# Patient Record
Sex: Male | Born: 1940 | Race: Black or African American | Hispanic: No | Smoking: Former smoker
Health system: Southern US, Community
[De-identification: ages and names within clinical notes are randomized; demographics above are authoritative.]

## PROBLEM LIST (undated history)

## (undated) DIAGNOSIS — K5730 Diverticulosis of large intestine without perforation or abscess without bleeding: Secondary | ICD-10-CM

## (undated) DIAGNOSIS — N183 Chronic kidney disease, stage 3 unspecified: Secondary | ICD-10-CM

## (undated) DIAGNOSIS — M51369 Other intervertebral disc degeneration, lumbar region without mention of lumbar back pain or lower extremity pain: Secondary | ICD-10-CM

## (undated) DIAGNOSIS — D696 Thrombocytopenia, unspecified: Secondary | ICD-10-CM

## (undated) DIAGNOSIS — J449 Chronic obstructive pulmonary disease, unspecified: Secondary | ICD-10-CM

## (undated) DIAGNOSIS — K5790 Diverticulosis of intestine, part unspecified, without perforation or abscess without bleeding: Secondary | ICD-10-CM

## (undated) DIAGNOSIS — Z8546 Personal history of malignant neoplasm of prostate: Secondary | ICD-10-CM

## (undated) DIAGNOSIS — F32A Depression, unspecified: Secondary | ICD-10-CM

## (undated) DIAGNOSIS — N289 Disorder of kidney and ureter, unspecified: Secondary | ICD-10-CM

## (undated) DIAGNOSIS — F329 Major depressive disorder, single episode, unspecified: Secondary | ICD-10-CM

## (undated) DIAGNOSIS — J989 Respiratory disorder, unspecified: Secondary | ICD-10-CM

## (undated) DIAGNOSIS — K279 Peptic ulcer, site unspecified, unspecified as acute or chronic, without hemorrhage or perforation: Secondary | ICD-10-CM

## (undated) DIAGNOSIS — G9340 Encephalopathy, unspecified: Secondary | ICD-10-CM

## (undated) DIAGNOSIS — K222 Esophageal obstruction: Secondary | ICD-10-CM

## (undated) DIAGNOSIS — I10 Essential (primary) hypertension: Secondary | ICD-10-CM

## (undated) DIAGNOSIS — Z862 Personal history of diseases of the blood and blood-forming organs and certain disorders involving the immune mechanism: Secondary | ICD-10-CM

## (undated) DIAGNOSIS — N529 Male erectile dysfunction, unspecified: Secondary | ICD-10-CM

## (undated) DIAGNOSIS — R569 Unspecified convulsions: Secondary | ICD-10-CM

## (undated) DIAGNOSIS — K3184 Gastroparesis: Secondary | ICD-10-CM

## (undated) DIAGNOSIS — G4730 Sleep apnea, unspecified: Secondary | ICD-10-CM

## (undated) DIAGNOSIS — K219 Gastro-esophageal reflux disease without esophagitis: Secondary | ICD-10-CM

## (undated) DIAGNOSIS — E785 Hyperlipidemia, unspecified: Secondary | ICD-10-CM

## (undated) DIAGNOSIS — Z860100 Personal history of colon polyps, unspecified: Secondary | ICD-10-CM

## (undated) DIAGNOSIS — K859 Acute pancreatitis without necrosis or infection, unspecified: Secondary | ICD-10-CM

## (undated) DIAGNOSIS — E669 Obesity, unspecified: Secondary | ICD-10-CM

## (undated) DIAGNOSIS — Z8719 Personal history of other diseases of the digestive system: Secondary | ICD-10-CM

## (undated) DIAGNOSIS — G4733 Obstructive sleep apnea (adult) (pediatric): Secondary | ICD-10-CM

## (undated) DIAGNOSIS — I509 Heart failure, unspecified: Secondary | ICD-10-CM

## (undated) DIAGNOSIS — M5136 Other intervertebral disc degeneration, lumbar region: Secondary | ICD-10-CM

## (undated) DIAGNOSIS — E662 Morbid (severe) obesity with alveolar hypoventilation: Secondary | ICD-10-CM

## (undated) DIAGNOSIS — M1990 Unspecified osteoarthritis, unspecified site: Secondary | ICD-10-CM

## (undated) DIAGNOSIS — G40909 Epilepsy, unspecified, not intractable, without status epilepticus: Secondary | ICD-10-CM

## (undated) HISTORY — PX: BONE MARROW BIOPSY: SHX199

## (undated) HISTORY — DX: Encephalopathy, unspecified: G93.40

## (undated) HISTORY — DX: Obstructive sleep apnea (adult) (pediatric): G47.33

## (undated) HISTORY — DX: Hyperlipidemia, unspecified: E78.5

## (undated) HISTORY — DX: Chronic obstructive pulmonary disease, unspecified: J44.9

## (undated) HISTORY — DX: Unspecified convulsions: R56.9

## (undated) HISTORY — DX: Acute pancreatitis without necrosis or infection, unspecified: K85.90

## (undated) HISTORY — DX: Esophageal obstruction: K22.2

## (undated) HISTORY — DX: Essential (primary) hypertension: I10

## (undated) HISTORY — DX: Morbid (severe) obesity with alveolar hypoventilation: E66.2

## (undated) HISTORY — DX: Gastro-esophageal reflux disease without esophagitis: K21.9

## (undated) HISTORY — DX: Diverticulosis of large intestine without perforation or abscess without bleeding: K57.30

## (undated) HISTORY — DX: Unspecified osteoarthritis, unspecified site: M19.90

## (undated) HISTORY — PX: CORONARY ARTERY BYPASS GRAFT: SHX141

## (undated) HISTORY — PX: PROSTATE SURGERY: SHX751

## (undated) HISTORY — DX: Major depressive disorder, single episode, unspecified: F32.9

## (undated) HISTORY — DX: Respiratory disorder, unspecified: J98.9

## (undated) HISTORY — DX: Epilepsy, unspecified, not intractable, without status epilepticus: G40.909

## (undated) HISTORY — DX: Depression, unspecified: F32.A

## (undated) HISTORY — DX: Male erectile dysfunction, unspecified: N52.9

## (undated) HISTORY — DX: Diverticulosis of intestine, part unspecified, without perforation or abscess without bleeding: K57.90

## (undated) HISTORY — DX: Personal history of malignant neoplasm of prostate: Z85.46

## (undated) HISTORY — DX: Obesity, unspecified: E66.9

## (undated) HISTORY — DX: Sleep apnea, unspecified: G47.30

## (undated) HISTORY — DX: Gastroparesis: K31.84

## (undated) HISTORY — DX: Thrombocytopenia, unspecified: D69.6

## (undated) HISTORY — PX: BACK SURGERY: SHX140

## (undated) HISTORY — DX: Heart failure, unspecified: I50.9

---

## 1998-05-23 ENCOUNTER — Encounter: Payer: Self-pay | Admitting: General Surgery

## 1998-05-23 ENCOUNTER — Ambulatory Visit (HOSPITAL_COMMUNITY): Admission: RE | Admit: 1998-05-23 | Discharge: 1998-05-23 | Payer: Self-pay | Admitting: General Surgery

## 1998-06-04 ENCOUNTER — Ambulatory Visit (HOSPITAL_COMMUNITY): Admission: RE | Admit: 1998-06-04 | Discharge: 1998-06-04 | Payer: Self-pay | Admitting: General Surgery

## 1999-09-30 ENCOUNTER — Other Ambulatory Visit: Admission: RE | Admit: 1999-09-30 | Discharge: 1999-09-30 | Payer: Self-pay | Admitting: Gastroenterology

## 1999-09-30 ENCOUNTER — Encounter (INDEPENDENT_AMBULATORY_CARE_PROVIDER_SITE_OTHER): Payer: Self-pay | Admitting: Specialist

## 1999-10-18 ENCOUNTER — Emergency Department (HOSPITAL_COMMUNITY): Admission: EM | Admit: 1999-10-18 | Discharge: 1999-10-18 | Payer: Self-pay | Admitting: Emergency Medicine

## 2001-05-25 ENCOUNTER — Encounter: Payer: Self-pay | Admitting: Gastroenterology

## 2001-05-25 ENCOUNTER — Ambulatory Visit (HOSPITAL_COMMUNITY): Admission: RE | Admit: 2001-05-25 | Discharge: 2001-05-25 | Payer: Self-pay | Admitting: Family Medicine

## 2001-06-26 ENCOUNTER — Encounter: Admission: RE | Admit: 2001-06-26 | Discharge: 2001-06-26 | Payer: Self-pay | Admitting: Family Medicine

## 2001-06-26 ENCOUNTER — Encounter: Payer: Self-pay | Admitting: Family Medicine

## 2001-08-14 ENCOUNTER — Encounter: Payer: Self-pay | Admitting: Family Medicine

## 2001-08-14 ENCOUNTER — Encounter: Admission: RE | Admit: 2001-08-14 | Discharge: 2001-08-14 | Payer: Self-pay | Admitting: Family Medicine

## 2002-01-30 ENCOUNTER — Encounter: Admission: RE | Admit: 2002-01-30 | Discharge: 2002-01-30 | Payer: Self-pay | Admitting: Orthopedic Surgery

## 2002-01-30 ENCOUNTER — Encounter: Payer: Self-pay | Admitting: Orthopedic Surgery

## 2003-03-26 ENCOUNTER — Encounter: Admission: RE | Admit: 2003-03-26 | Discharge: 2003-03-26 | Payer: Self-pay | Admitting: Orthopedic Surgery

## 2004-03-17 ENCOUNTER — Ambulatory Visit: Payer: Self-pay | Admitting: Internal Medicine

## 2004-05-13 ENCOUNTER — Ambulatory Visit: Payer: Self-pay | Admitting: Internal Medicine

## 2004-08-03 ENCOUNTER — Ambulatory Visit: Payer: Self-pay | Admitting: Critical Care Medicine

## 2004-11-20 ENCOUNTER — Encounter: Admission: RE | Admit: 2004-11-20 | Discharge: 2004-11-20 | Payer: Self-pay | Admitting: Orthopedic Surgery

## 2005-02-02 ENCOUNTER — Ambulatory Visit: Payer: Self-pay | Admitting: Internal Medicine

## 2005-03-09 ENCOUNTER — Ambulatory Visit: Payer: Self-pay | Admitting: Internal Medicine

## 2005-03-09 LAB — CONVERTED CEMR LAB: PSA: 0.52 ng/mL

## 2005-03-17 ENCOUNTER — Encounter: Admission: RE | Admit: 2005-03-17 | Discharge: 2005-03-17 | Payer: Self-pay | Admitting: Orthopedic Surgery

## 2005-04-01 ENCOUNTER — Ambulatory Visit: Payer: Self-pay | Admitting: Internal Medicine

## 2005-04-05 ENCOUNTER — Ambulatory Visit: Payer: Self-pay | Admitting: Internal Medicine

## 2005-04-21 ENCOUNTER — Ambulatory Visit: Payer: Self-pay | Admitting: Internal Medicine

## 2005-04-23 ENCOUNTER — Ambulatory Visit: Payer: Self-pay | Admitting: Internal Medicine

## 2005-06-28 ENCOUNTER — Ambulatory Visit: Payer: Self-pay | Admitting: Internal Medicine

## 2005-07-02 ENCOUNTER — Ambulatory Visit: Payer: Self-pay | Admitting: Internal Medicine

## 2005-07-03 ENCOUNTER — Emergency Department (HOSPITAL_COMMUNITY): Admission: EM | Admit: 2005-07-03 | Discharge: 2005-07-03 | Payer: Self-pay | Admitting: Emergency Medicine

## 2005-08-03 ENCOUNTER — Ambulatory Visit: Payer: Self-pay | Admitting: Internal Medicine

## 2005-08-12 ENCOUNTER — Ambulatory Visit: Payer: Self-pay | Admitting: Endocrinology

## 2005-09-06 ENCOUNTER — Ambulatory Visit: Payer: Self-pay

## 2005-09-08 ENCOUNTER — Ambulatory Visit: Payer: Self-pay | Admitting: Pulmonary Disease

## 2005-10-21 ENCOUNTER — Ambulatory Visit: Payer: Self-pay | Admitting: Internal Medicine

## 2005-10-21 ENCOUNTER — Inpatient Hospital Stay (HOSPITAL_COMMUNITY): Admission: RE | Admit: 2005-10-21 | Discharge: 2005-10-28 | Payer: Self-pay | Admitting: Orthopedic Surgery

## 2005-10-29 ENCOUNTER — Ambulatory Visit: Payer: Self-pay | Admitting: Internal Medicine

## 2005-11-13 ENCOUNTER — Emergency Department (HOSPITAL_COMMUNITY): Admission: EM | Admit: 2005-11-13 | Discharge: 2005-11-13 | Payer: Self-pay | Admitting: Emergency Medicine

## 2005-12-06 ENCOUNTER — Ambulatory Visit (HOSPITAL_BASED_OUTPATIENT_CLINIC_OR_DEPARTMENT_OTHER): Admission: RE | Admit: 2005-12-06 | Discharge: 2005-12-06 | Payer: Self-pay | Admitting: Internal Medicine

## 2005-12-16 ENCOUNTER — Ambulatory Visit: Payer: Self-pay | Admitting: Internal Medicine

## 2005-12-22 ENCOUNTER — Ambulatory Visit: Payer: Self-pay | Admitting: Pulmonary Disease

## 2006-02-03 ENCOUNTER — Ambulatory Visit: Payer: Self-pay | Admitting: Internal Medicine

## 2006-02-16 ENCOUNTER — Ambulatory Visit: Payer: Self-pay | Admitting: Internal Medicine

## 2006-02-16 LAB — CONVERTED CEMR LAB
Calcium: 9.2 mg/dL (ref 8.4–10.5)
Chloride: 102 meq/L (ref 96–112)
GFR calc non Af Amer: 40 mL/min
Glomerular Filtration Rate, Af Am: 49 mL/min/{1.73_m2}
Glucose, Bld: 116 mg/dL — ABNORMAL HIGH (ref 70–99)
Sodium: 138 meq/L (ref 135–145)

## 2006-04-05 ENCOUNTER — Ambulatory Visit: Payer: Self-pay | Admitting: Internal Medicine

## 2006-04-05 LAB — CONVERTED CEMR LAB
BUN: 25 mg/dL — ABNORMAL HIGH (ref 6–23)
Creatinine, Ser: 1.6 mg/dL — ABNORMAL HIGH (ref 0.4–1.5)
GFR calc non Af Amer: 46 mL/min
Potassium: 4.7 meq/L (ref 3.5–5.1)
Sodium: 141 meq/L (ref 135–145)

## 2006-07-11 ENCOUNTER — Ambulatory Visit: Payer: Self-pay | Admitting: Gastroenterology

## 2006-07-23 ENCOUNTER — Emergency Department (HOSPITAL_COMMUNITY): Admission: EM | Admit: 2006-07-23 | Discharge: 2006-07-23 | Payer: Self-pay | Admitting: Emergency Medicine

## 2006-08-31 ENCOUNTER — Ambulatory Visit: Payer: Self-pay | Admitting: Gastroenterology

## 2006-09-15 ENCOUNTER — Ambulatory Visit: Payer: Self-pay | Admitting: Gastroenterology

## 2006-09-15 ENCOUNTER — Encounter: Payer: Self-pay | Admitting: Gastroenterology

## 2006-09-22 ENCOUNTER — Encounter: Payer: Self-pay | Admitting: Internal Medicine

## 2006-09-22 DIAGNOSIS — I10 Essential (primary) hypertension: Secondary | ICD-10-CM | POA: Insufficient documentation

## 2006-09-22 DIAGNOSIS — E1165 Type 2 diabetes mellitus with hyperglycemia: Secondary | ICD-10-CM

## 2006-09-22 DIAGNOSIS — J449 Chronic obstructive pulmonary disease, unspecified: Secondary | ICD-10-CM

## 2006-09-22 DIAGNOSIS — E785 Hyperlipidemia, unspecified: Secondary | ICD-10-CM | POA: Insufficient documentation

## 2006-09-22 DIAGNOSIS — F17200 Nicotine dependence, unspecified, uncomplicated: Secondary | ICD-10-CM | POA: Insufficient documentation

## 2006-09-22 DIAGNOSIS — E1129 Type 2 diabetes mellitus with other diabetic kidney complication: Secondary | ICD-10-CM

## 2006-09-22 DIAGNOSIS — J309 Allergic rhinitis, unspecified: Secondary | ICD-10-CM | POA: Insufficient documentation

## 2006-09-22 DIAGNOSIS — R569 Unspecified convulsions: Secondary | ICD-10-CM

## 2006-09-22 DIAGNOSIS — G4733 Obstructive sleep apnea (adult) (pediatric): Secondary | ICD-10-CM

## 2006-09-22 HISTORY — DX: Unspecified convulsions: R56.9

## 2006-09-22 HISTORY — DX: Obstructive sleep apnea (adult) (pediatric): G47.33

## 2006-10-05 ENCOUNTER — Ambulatory Visit: Payer: Self-pay | Admitting: Internal Medicine

## 2006-10-10 ENCOUNTER — Encounter: Admission: RE | Admit: 2006-10-10 | Discharge: 2006-10-10 | Payer: Self-pay | Admitting: Internal Medicine

## 2006-11-03 ENCOUNTER — Ambulatory Visit: Payer: Self-pay

## 2006-11-03 ENCOUNTER — Encounter: Payer: Self-pay | Admitting: Internal Medicine

## 2006-11-09 ENCOUNTER — Ambulatory Visit: Payer: Self-pay

## 2006-11-09 ENCOUNTER — Ambulatory Visit: Payer: Self-pay | Admitting: Internal Medicine

## 2006-11-09 LAB — CONVERTED CEMR LAB
Bilirubin Urine: NEGATIVE
Chloride: 104 meq/L (ref 96–112)
GFR calc Af Amer: 41 mL/min
GFR calc non Af Amer: 34 mL/min
Glucose, Bld: 142 mg/dL — ABNORMAL HIGH (ref 70–99)
Hemoglobin, Urine: NEGATIVE
Ketones, ur: NEGATIVE mg/dL
Leukocytes, UA: NEGATIVE
Nitrite: NEGATIVE
Potassium: 4 meq/L (ref 3.5–5.1)
Pro B Natriuretic peptide (BNP): 4 pg/mL (ref 0.0–100.0)
Sodium: 144 meq/L (ref 135–145)
Specific Gravity, Urine: 1.015 (ref 1.000–1.03)
Total Protein, Urine: NEGATIVE mg/dL
Urine Glucose: NEGATIVE mg/dL
Urobilinogen, UA: 0.2 (ref 0.0–1.0)
pH: 5.5 (ref 5.0–8.0)

## 2006-12-24 ENCOUNTER — Ambulatory Visit (HOSPITAL_COMMUNITY): Admission: RE | Admit: 2006-12-24 | Discharge: 2006-12-24 | Payer: Self-pay | Admitting: Internal Medicine

## 2006-12-24 ENCOUNTER — Telehealth: Payer: Self-pay | Admitting: Internal Medicine

## 2006-12-24 ENCOUNTER — Ambulatory Visit: Payer: Self-pay | Admitting: Internal Medicine

## 2006-12-27 ENCOUNTER — Emergency Department (HOSPITAL_COMMUNITY): Admission: EM | Admit: 2006-12-27 | Discharge: 2006-12-27 | Payer: Self-pay | Admitting: Emergency Medicine

## 2006-12-29 ENCOUNTER — Ambulatory Visit: Payer: Self-pay | Admitting: Internal Medicine

## 2007-01-05 ENCOUNTER — Emergency Department (HOSPITAL_COMMUNITY): Admission: EM | Admit: 2007-01-05 | Discharge: 2007-01-06 | Payer: Self-pay | Admitting: Emergency Medicine

## 2007-01-13 ENCOUNTER — Ambulatory Visit: Payer: Self-pay | Admitting: Gastroenterology

## 2007-03-27 ENCOUNTER — Telehealth (INDEPENDENT_AMBULATORY_CARE_PROVIDER_SITE_OTHER): Payer: Self-pay | Admitting: *Deleted

## 2007-03-28 ENCOUNTER — Ambulatory Visit: Payer: Self-pay | Admitting: Internal Medicine

## 2007-03-29 ENCOUNTER — Encounter: Payer: Self-pay | Admitting: Internal Medicine

## 2007-04-17 DIAGNOSIS — D696 Thrombocytopenia, unspecified: Secondary | ICD-10-CM

## 2007-04-17 DIAGNOSIS — F329 Major depressive disorder, single episode, unspecified: Secondary | ICD-10-CM | POA: Insufficient documentation

## 2007-04-17 HISTORY — DX: Thrombocytopenia, unspecified: D69.6

## 2007-05-04 ENCOUNTER — Ambulatory Visit: Payer: Self-pay | Admitting: Gastroenterology

## 2007-05-10 ENCOUNTER — Ambulatory Visit: Payer: Self-pay | Admitting: Gastroenterology

## 2007-05-10 DIAGNOSIS — R1013 Epigastric pain: Secondary | ICD-10-CM | POA: Insufficient documentation

## 2007-05-15 ENCOUNTER — Ambulatory Visit (HOSPITAL_COMMUNITY): Admission: RE | Admit: 2007-05-15 | Discharge: 2007-05-15 | Payer: Self-pay | Admitting: Gastroenterology

## 2007-05-15 ENCOUNTER — Encounter: Payer: Self-pay | Admitting: Gastroenterology

## 2007-05-18 ENCOUNTER — Ambulatory Visit: Payer: Self-pay | Admitting: Gastroenterology

## 2007-05-30 ENCOUNTER — Ambulatory Visit (HOSPITAL_COMMUNITY): Admission: RE | Admit: 2007-05-30 | Discharge: 2007-05-30 | Payer: Self-pay | Admitting: Gastroenterology

## 2007-06-12 ENCOUNTER — Ambulatory Visit: Payer: Self-pay | Admitting: Gastroenterology

## 2007-06-21 ENCOUNTER — Encounter: Payer: Self-pay | Admitting: Internal Medicine

## 2007-10-13 ENCOUNTER — Ambulatory Visit: Payer: Self-pay | Admitting: Internal Medicine

## 2007-10-17 ENCOUNTER — Telehealth (INDEPENDENT_AMBULATORY_CARE_PROVIDER_SITE_OTHER): Payer: Self-pay | Admitting: *Deleted

## 2007-11-08 ENCOUNTER — Encounter: Payer: Self-pay | Admitting: Internal Medicine

## 2007-12-13 ENCOUNTER — Emergency Department (HOSPITAL_COMMUNITY): Admission: EM | Admit: 2007-12-13 | Discharge: 2007-12-14 | Payer: Self-pay | Admitting: Emergency Medicine

## 2007-12-19 ENCOUNTER — Ambulatory Visit: Payer: Self-pay | Admitting: Family Medicine

## 2007-12-21 ENCOUNTER — Encounter: Admission: RE | Admit: 2007-12-21 | Discharge: 2007-12-21 | Payer: Self-pay | Admitting: Chiropractic Medicine

## 2008-01-22 ENCOUNTER — Ambulatory Visit: Payer: Self-pay | Admitting: Family Medicine

## 2008-02-20 ENCOUNTER — Ambulatory Visit: Payer: Self-pay | Admitting: Family Medicine

## 2008-04-04 ENCOUNTER — Encounter: Payer: Self-pay | Admitting: Internal Medicine

## 2008-05-02 HISTORY — PX: CARDIAC CATHETERIZATION: SHX172

## 2008-07-11 ENCOUNTER — Encounter: Admission: RE | Admit: 2008-07-11 | Discharge: 2008-07-11 | Payer: Self-pay

## 2008-08-12 ENCOUNTER — Encounter: Payer: Self-pay | Admitting: Internal Medicine

## 2008-08-15 ENCOUNTER — Telehealth (INDEPENDENT_AMBULATORY_CARE_PROVIDER_SITE_OTHER): Payer: Self-pay | Admitting: *Deleted

## 2008-08-15 ENCOUNTER — Encounter: Payer: Self-pay | Admitting: Internal Medicine

## 2008-08-16 ENCOUNTER — Encounter: Payer: Self-pay | Admitting: Internal Medicine

## 2008-09-09 ENCOUNTER — Ambulatory Visit: Payer: Self-pay | Admitting: Family Medicine

## 2008-09-13 ENCOUNTER — Ambulatory Visit: Payer: Self-pay | Admitting: Internal Medicine

## 2008-09-24 LAB — CBC WITH DIFFERENTIAL/PLATELET
BASO%: 1.1 % (ref 0.0–2.0)
EOS%: 0.9 % (ref 0.0–7.0)
LYMPH%: 29.8 % (ref 14.0–49.0)
MCHC: 32.9 g/dL (ref 32.0–36.0)
MCV: 94.3 fL (ref 79.3–98.0)
MONO%: 9.2 % (ref 0.0–14.0)
Platelets: 63 10*3/uL — ABNORMAL LOW (ref 140–400)
RBC: 6.05 10*6/uL — ABNORMAL HIGH (ref 4.20–5.82)

## 2008-09-24 LAB — LACTATE DEHYDROGENASE: LDH: 184 U/L (ref 94–250)

## 2008-09-24 LAB — COMPREHENSIVE METABOLIC PANEL
ALT: 30 U/L (ref 0–53)
AST: 21 U/L (ref 0–37)
Alkaline Phosphatase: 72 U/L (ref 39–117)
CO2: 27 mEq/L (ref 19–32)
Creatinine, Ser: 2.09 mg/dL — ABNORMAL HIGH (ref 0.40–1.50)
Total Bilirubin: 0.6 mg/dL (ref 0.3–1.2)

## 2008-09-27 ENCOUNTER — Encounter (INDEPENDENT_AMBULATORY_CARE_PROVIDER_SITE_OTHER): Payer: Self-pay | Admitting: *Deleted

## 2008-09-27 ENCOUNTER — Ambulatory Visit (HOSPITAL_COMMUNITY): Admission: RE | Admit: 2008-09-27 | Discharge: 2008-09-27 | Payer: Self-pay | Admitting: Internal Medicine

## 2008-10-01 LAB — FOLATE: Folate: >20 ng/mL

## 2008-10-01 LAB — CBC WITH DIFFERENTIAL/PLATELET
BASO%: 0.8 % (ref 0.0–2.0)
Basophils Absolute: 0.1 10*3/uL (ref 0.0–0.1)
EOS%: 1.4 % (ref 0.0–7.0)
HCT: 56.8 % — ABNORMAL HIGH (ref 38.4–49.9)
HGB: 18.5 g/dL — ABNORMAL HIGH (ref 13.0–17.1)
MCH: 30.8 pg (ref 27.2–33.4)
MCHC: 32.6 g/dL (ref 32.0–36.0)
MCV: 94.4 fL (ref 79.3–98.0)
MONO%: 9.2 % (ref 0.0–14.0)
NEUT%: 61.1 % (ref 39.0–75.0)
RDW: 14.1 % (ref 11.0–14.6)

## 2008-10-01 LAB — VITAMIN B12: Vitamin B-12: 484 pg/mL (ref 211–911)

## 2008-10-01 LAB — ERYTHROPOIETIN: Erythropoietin: 19.3 m[IU]/mL (ref 2.6–34.0)

## 2008-10-01 LAB — IRON AND TIBC: UIBC: 278 ug/dL

## 2008-10-01 LAB — JAK2 GENOTYPR: JAK2 GenotypR: NOT DETECTED

## 2008-10-08 ENCOUNTER — Encounter: Payer: Self-pay | Admitting: Internal Medicine

## 2008-11-05 ENCOUNTER — Ambulatory Visit: Payer: Self-pay | Admitting: Family Medicine

## 2008-12-13 ENCOUNTER — Encounter: Payer: Self-pay | Admitting: Internal Medicine

## 2008-12-30 ENCOUNTER — Ambulatory Visit: Payer: Self-pay | Admitting: Internal Medicine

## 2009-01-01 LAB — CBC WITH DIFFERENTIAL/PLATELET
BASO%: 0.6 % (ref 0.0–2.0)
Basophils Absolute: 0.1 10*3/uL (ref 0.0–0.1)
EOS%: 1.3 % (ref 0.0–7.0)
HGB: 17.1 g/dL (ref 13.0–17.1)
MCH: 31.1 pg (ref 27.2–33.4)
MCHC: 32.5 g/dL (ref 32.0–36.0)
MONO#: 0.8 10*3/uL (ref 0.1–0.9)
RDW: 13.9 % (ref 11.0–14.6)
WBC: 11 10*3/uL — ABNORMAL HIGH (ref 4.0–10.3)
lymph#: 3.1 10*3/uL (ref 0.9–3.3)

## 2009-01-03 ENCOUNTER — Ambulatory Visit: Payer: Self-pay | Admitting: Family Medicine

## 2009-01-15 ENCOUNTER — Encounter: Payer: Self-pay | Admitting: Internal Medicine

## 2009-03-03 ENCOUNTER — Ambulatory Visit: Payer: Self-pay | Admitting: Internal Medicine

## 2009-04-07 ENCOUNTER — Encounter: Payer: Self-pay | Admitting: Internal Medicine

## 2009-04-10 ENCOUNTER — Ambulatory Visit: Payer: Self-pay | Admitting: Internal Medicine

## 2009-04-14 LAB — CBC WITH DIFFERENTIAL/PLATELET
Basophils Absolute: 0.1 10*3/uL (ref 0.0–0.1)
Eosinophils Absolute: 0.1 10*3/uL (ref 0.0–0.5)
HCT: 59 % — ABNORMAL HIGH (ref 38.4–49.9)
LYMPH%: 29.4 % (ref 14.0–49.0)
MONO#: 0.8 10*3/uL (ref 0.1–0.9)
NEUT#: 5.9 10*3/uL (ref 1.5–6.5)
NEUT%: 59.9 % (ref 39.0–75.0)
Platelets: 53 10*3/uL — ABNORMAL LOW (ref 140–400)
WBC: 9.8 10*3/uL (ref 4.0–10.3)

## 2009-05-02 ENCOUNTER — Ambulatory Visit (HOSPITAL_COMMUNITY): Admission: RE | Admit: 2009-05-02 | Discharge: 2009-05-02 | Payer: Self-pay | Admitting: Internal Medicine

## 2009-05-13 ENCOUNTER — Ambulatory Visit: Payer: Self-pay | Admitting: Internal Medicine

## 2009-06-02 ENCOUNTER — Ambulatory Visit: Payer: Self-pay | Admitting: Family Medicine

## 2009-06-09 LAB — CBC WITH DIFFERENTIAL/PLATELET
Basophils Absolute: 0 10*3/uL (ref 0.0–0.1)
Eosinophils Absolute: 0.1 10*3/uL (ref 0.0–0.5)
HCT: 60.8 % — ABNORMAL HIGH (ref 38.4–49.9)
HGB: 19.6 g/dL — ABNORMAL HIGH (ref 13.0–17.1)
MCV: 98.1 fL — ABNORMAL HIGH (ref 79.3–98.0)
NEUT#: 5.4 10*3/uL (ref 1.5–6.5)
RDW: 14.9 % — ABNORMAL HIGH (ref 11.0–14.6)
lymph#: 2.9 10*3/uL (ref 0.9–3.3)

## 2009-06-20 ENCOUNTER — Inpatient Hospital Stay (HOSPITAL_COMMUNITY): Admission: EM | Admit: 2009-06-20 | Discharge: 2009-06-23 | Payer: Self-pay | Admitting: Emergency Medicine

## 2009-06-20 ENCOUNTER — Encounter (INDEPENDENT_AMBULATORY_CARE_PROVIDER_SITE_OTHER): Payer: Self-pay | Admitting: *Deleted

## 2009-06-22 ENCOUNTER — Encounter (INDEPENDENT_AMBULATORY_CARE_PROVIDER_SITE_OTHER): Payer: Self-pay | Admitting: *Deleted

## 2009-06-23 ENCOUNTER — Encounter (INDEPENDENT_AMBULATORY_CARE_PROVIDER_SITE_OTHER): Payer: Self-pay | Admitting: *Deleted

## 2009-07-03 ENCOUNTER — Ambulatory Visit: Payer: Self-pay | Admitting: Family Medicine

## 2009-07-09 ENCOUNTER — Encounter (INDEPENDENT_AMBULATORY_CARE_PROVIDER_SITE_OTHER): Payer: Self-pay | Admitting: *Deleted

## 2009-07-09 ENCOUNTER — Emergency Department (HOSPITAL_COMMUNITY): Admission: EM | Admit: 2009-07-09 | Discharge: 2009-07-09 | Payer: Self-pay | Admitting: Emergency Medicine

## 2009-07-14 ENCOUNTER — Ambulatory Visit: Payer: Self-pay | Admitting: Family Medicine

## 2009-07-21 ENCOUNTER — Telehealth: Payer: Self-pay | Admitting: Gastroenterology

## 2009-07-24 ENCOUNTER — Telehealth: Payer: Self-pay | Admitting: Gastroenterology

## 2009-07-29 ENCOUNTER — Encounter (INDEPENDENT_AMBULATORY_CARE_PROVIDER_SITE_OTHER): Payer: Self-pay | Admitting: *Deleted

## 2009-08-05 ENCOUNTER — Ambulatory Visit: Payer: Self-pay | Admitting: Gastroenterology

## 2009-08-05 DIAGNOSIS — Z860100 Personal history of colon polyps, unspecified: Secondary | ICD-10-CM | POA: Insufficient documentation

## 2009-08-05 DIAGNOSIS — K2990 Gastroduodenitis, unspecified, without bleeding: Secondary | ICD-10-CM

## 2009-08-05 DIAGNOSIS — R1031 Right lower quadrant pain: Secondary | ICD-10-CM

## 2009-08-05 DIAGNOSIS — K2970 Gastritis, unspecified, without bleeding: Secondary | ICD-10-CM | POA: Insufficient documentation

## 2009-08-05 DIAGNOSIS — Z8546 Personal history of malignant neoplasm of prostate: Secondary | ICD-10-CM

## 2009-08-05 DIAGNOSIS — K859 Acute pancreatitis without necrosis or infection, unspecified: Secondary | ICD-10-CM | POA: Insufficient documentation

## 2009-08-05 DIAGNOSIS — K5730 Diverticulosis of large intestine without perforation or abscess without bleeding: Secondary | ICD-10-CM

## 2009-08-05 HISTORY — DX: Diverticulosis of large intestine without perforation or abscess without bleeding: K57.30

## 2009-08-05 HISTORY — DX: Personal history of malignant neoplasm of prostate: Z85.46

## 2009-08-06 ENCOUNTER — Ambulatory Visit: Payer: Self-pay | Admitting: Cardiology

## 2009-08-06 ENCOUNTER — Telehealth: Payer: Self-pay | Admitting: Gastroenterology

## 2009-08-06 LAB — CONVERTED CEMR LAB
Albumin: 4.3 g/dL (ref 3.5–5.2)
Alkaline Phosphatase: 80 units/L (ref 39–117)
Amylase: 117 units/L (ref 27–131)
Creatinine, Ser: 2.7 mg/dL — ABNORMAL HIGH (ref 0.4–1.5)
Total Protein: 7.6 g/dL (ref 6.0–8.3)

## 2009-08-07 ENCOUNTER — Encounter (INDEPENDENT_AMBULATORY_CARE_PROVIDER_SITE_OTHER): Payer: Self-pay | Admitting: *Deleted

## 2009-08-11 ENCOUNTER — Telehealth: Payer: Self-pay | Admitting: Gastroenterology

## 2009-08-15 ENCOUNTER — Ambulatory Visit: Payer: Self-pay | Admitting: Gastroenterology

## 2009-08-25 ENCOUNTER — Ambulatory Visit (HOSPITAL_COMMUNITY): Admission: RE | Admit: 2009-08-25 | Discharge: 2009-08-25 | Payer: Self-pay | Admitting: Gastroenterology

## 2009-08-26 ENCOUNTER — Telehealth: Payer: Self-pay | Admitting: Gastroenterology

## 2009-08-27 ENCOUNTER — Encounter (INDEPENDENT_AMBULATORY_CARE_PROVIDER_SITE_OTHER): Payer: Self-pay | Admitting: *Deleted

## 2009-09-04 ENCOUNTER — Encounter: Payer: Self-pay | Admitting: Internal Medicine

## 2009-10-01 ENCOUNTER — Encounter (INDEPENDENT_AMBULATORY_CARE_PROVIDER_SITE_OTHER): Payer: Self-pay | Admitting: *Deleted

## 2010-01-02 ENCOUNTER — Ambulatory Visit: Payer: Self-pay | Admitting: Family Medicine

## 2010-02-03 ENCOUNTER — Ambulatory Visit: Payer: Self-pay | Admitting: Family Medicine

## 2010-03-22 ENCOUNTER — Encounter: Payer: Self-pay | Admitting: Orthopedic Surgery

## 2010-03-23 ENCOUNTER — Telehealth: Payer: Self-pay | Admitting: Internal Medicine

## 2010-03-31 NOTE — Progress Notes (Signed)
Summary: CT Results   Phone Note Call from Patient Call back at Home Phone [phone]   Caller: Patient Call For: Dr. Arlyce Dice Reason for Call: Lab or Test Results Summary of Call: Calling about CT  results Initial call taken by: Karna Christmas,  August 11, 2009 10:47 AM  Follow-up for Phone Call        Pt. continues with abd.pain. He will see Dr.Kaplan on 08-15-09 at 11am. Pt. instructed to call back as needed.  Follow-up by: Laureen Ochs LPN,  August 11, 2009 11:02 AM

## 2010-03-31 NOTE — Progress Notes (Signed)
Summary: Needs Sooner Appt.   Phone Note From Other Clinic   Caller: Tobi Bastos @ Dr Leane Platt office [phone] Call For: Dr Arlyce Dice Summary of Call: Wonders if we already triaged him and he is ok to wait until appoinment on July 8th? He called Dr Leane Platt office asking them to call us for work in GI appt. Initial call taken by: Leanor Kail [facility],  Jul 24, 2009 4:08 PM  Follow-up for Phone Call        I spoke with the patient, he would like to be seen sooner than July, but not before June 7th, to f/u on pancreatitis, pt. states he feels fine, but would like to be seen sooner. Tobi Bastos w/Dr.LaLonde states pt. needs to be seen ASAP, after June 7th.  Pt. appt. has been moved to 08-25-09 at 2:30pm. I will wait for the extender schedule to get pt. on sooner, also I will monitor Dr.Kaplans schedule for a sooner spot for pt.  Follow-up by: Laureen Ochs LPN,  Jul 24, 2009 4:27 PM  Additional Follow-up for Phone Call Additional follow up Details #1::        Pt. will see Willette Cluster NP on 08-07-09 at 11am, pt. accepts this appt.  Tobi Bastos w/Dr.Lalonde will fax records. Additional Follow-up by: Laureen Ochs LPN,  Jul 29, 2009 9:09 AM

## 2010-03-31 NOTE — Procedures (Signed)
Summary: EGD   EGD  Procedure date:  05/15/2007  Findings:      Location:     EGD  Procedure date:  05/15/2007  Findings:      Location: [facility]   Patient Name: [name] [name], [name] [name] MRN: [id number] Procedure Procedures: Panendoscopy (EGD) CPT: 43235.    with biopsy(s)/brushing(s). CPT: D1846139.  Personnel: Endoscopist: Barbette Hair. Arlyce Dice, MD.  Indications Symptoms: Abdominal pain,  History  Current Medications: Patient, Patient is not currently taking Coumadin.  Comments: Patient history reviewed and updated, pre-procedure physical performed prior to initiation of sedation? Pre-Exam Physical: Performed May 15, 2007  Cardio-pulmonary exam, Cardio-pulmonary exam, HEENT exam, HEENT exam, Abdominal exam, Abdominal exam, Mental status exam WNL.  Comments: Patient history reviewed and updated, pre-procedure physical performed prior to initiation of sedation?yes Exam Exam Info: Maximum depth of insertion Duodenum, intended Duodenum. Vocal cords, Vocal cords visualized. Gastric retroflexion, Gastric retroflexion performed. ASA Classification: II. Tolerance: good.  Sedation Meds: Patient assessed and found to be appropriate for moderate (conscious) sedation. Robinul 0.2 given IV. Fentanyl 25 mcg. given IV. Versed 2 mg. given IV. Cetacaine Spray 2 sprays given aerosolized.  Monitoring: BP and pulse monitoring, BP and pulse monitoring done. Oximetry used., Oximetry used. Supplemental O2 given Supplemental O2 given at 2 Liters. at 2 Liters.  Findings - Normal: Proximal Esophagus to Fundus.  - MUCOSAL ABNORMALITY: Body to Antrum. Erythematous mucosa. Biopsy/Mucosal Abn taken. ICD9: Gastritis, Unspecified: 535.50. Comment: Streaky erythema.  - MUCOSAL ABNORMALITY: Duodenal Bulb. Subepithelial hemorrhage present. ICD9: Duodenitis without Hemorrhage: 535.60. Comment: Few abnormal areas in duodenal apex.  - Normal: Duodenal Apex to Duodenal 2nd  Portion.   Assessment Abnormal examination, see findings above.  Diagnoses: 535.60: Duodenitis without Hemorrhage.  535.50: Gastritis, Unspecified.   Events  Unplanned Intervention: No unplanned interventions were required.  Unplanned Events: , There were no complications. Plans Medication(s): H2Blocker: Pepcid/Famotidine 40 mg QD, starting May 15, 2007  Other: Levbid 0.375 BID, starting May 15, 2007   Disposition: After procedure patient sent to recovery. After recovery patient sent home.  Scheduling: Office Visit, to [employer]. Arlyce Dice, MD, around Jun 05, 2007.    CC: The Patient  This report was created from the original endoscopy report, which was reviewed and signed by the above listed endoscopist.

## 2010-03-31 NOTE — Letter (Signed)
Summary: Generic Letter  [facility]  [address] [place], [place] [address]   Phone: [phone]  Fax: [phone]    08/27/2009  MERRITT MCCRAVY [address] [place], [place]  [address]       Dear Mr. Lampley,      I have been unable to reach you by phone, so I am sending this letter. Dr.Kaplan would like you to see a surgeon to consult about your gallbladder. I have scheduled you to see Dr.Benjamin Hoxworth at [facility] on 09-11-09 at 11am, arrive at 10:40am to get registered. I have enclosed a phamplet from [facility] with their information on it. If you have any questions please call me at [phone]. Thank You.       Sincerely,   Laureen Ochs LPN  Appended Document: Generic Letter Letter mailed to patient.

## 2010-03-31 NOTE — Progress Notes (Signed)
Summary: TRIAGE   Phone Note Call from Patient Call back at Home Phone [phone]   Caller: Patient Call For: Dr. Arlyce Dice Reason for Call: Talk to Nurse Summary of Call: "little pill you put under your tongue" is not helping Initial call taken by: Vallarie Mare,  August 06, 2009 3:38 PM  Follow-up for Phone Call        Message left for patient to callback. Laureen Ochs LPN  August 07, 9526 3:45 PM   OV 08-05-09 and CT 08-06-09. Pt. continues with abd. pain, Dr.Lalonde won't refill his Oxycodone 5-325, pt. wants Dr.Kaplan to prescribe  it for him. Pt. states the Nulev doesn't help, "That Oxycontin helps me sleep and take my insulin."   DR.KAPLAN PLEASE ADVISE  Follow-up by: Laureen Ochs LPN,  August 07, 4130 4:30 PM  Additional Follow-up for Phone Call Additional follow up Details #1::        ok to refill oxycodone ov 1 month Additional Follow-up by: Louis Meckel MD,  August 07, 2009 2:14 PM    Additional Follow-up for Phone Call Additional follow up Details #2::    Message left for pt. with above MD instructions. Pt. aware he will need to come pick-up his script. His OV is scheduled for 09-22-09 at 1:30pm. Pt. instructed to call back as needed.  Follow-up by: Laureen Ochs LPN,  August 08, 4399 2:45 PM  Prescriptions: OXYCODONE-ACETAMINOPHEN 5-325 MG TABS (OXYCODONE-ACETAMINOPHEN) take one by mouth every 4 hours as needed  #60 x 0   Entered by:   Laureen Ochs LPN   Authorized by:   Louis Meckel MD   Signed by:   Laureen Ochs LPN on 02/72/5366   Method used:   Print then Give to Patient   RxID:   [id number]

## 2010-03-31 NOTE — Letter (Signed)
Summary: [facility]  [facility]   Imported By: Lanelle Bal 05/13/2009 10:45:09  _____________________________________________________________________  External Attachment:    Type:   Image     Comment:   External Document

## 2010-03-31 NOTE — Letter (Signed)
Summary: Appt Reminder 2  [facility]  [address] [place], [place] [address]   Phone: [phone]  Fax: [phone]        August 07, 2009 MRN: [id number]    [facility] [address] [place], [place]  [address]    Dear [name] [name],   You have a return appointment with Dr.Robert Arlyce Dice on 09-22-09 at 1:30pm. Please remember to bring a complete list of the medicines you are taking, your insurance card and your co-pay.  If you have to cancel or reschedule this appointment, please call before 5:00 pm the evening before to avoid a cancellation fee.  If you have any questions or concerns, please call [phone].    Sincerely,    Laureen Ochs LPN  Appended Document: Appt Reminder 2 Letter mailed to patient.

## 2010-03-31 NOTE — Assessment & Plan Note (Signed)
Summary: F/U CT AND PANCREATITIS            [name] [name]    History of Present Illness Visit Type: Follow-up Visit Primary GI MD: Melvia Heaps MD [facility] Primary Provider: Sharlot Gowda, MD Requesting Provider: na Chief Complaint: Patient complains of RUQ pain that is constant unless he takes his pain meds. He is here to follow up on his CT scan and pancreatitis. Patient complains that he has not had a regular BM in four day.  History of Present Illness:   [name] [name] has returned for followup of his abdominal pain.  Serum amylase and lipase are normal.  CT Scan, which I reviewed, demonstrated resolution of inflammatory changes around the pancreas.  No acute changes were seen.  Gallbladder appears normal.  [name] [name] continues to complain of right upper quadrant and periumbilicalpain.  He has been suffering from this since his hospitalization in April, 2011 for acute pancreatitis.   GI Review of Systems    Reports abdominal pain.     Location of  Abdominal pain: RUQ.    Denies acid reflux, belching, bloating, chest pain, dysphagia with liquids, dysphagia with solids, heartburn, loss of appetite, nausea, vomiting, vomiting blood, weight loss, and  weight gain.      Reports constipation.     Denies anal fissure, black tarry stools, change in bowel habit, diarrhea, diverticulosis, fecal incontinence, heme positive stool, hemorrhoids, irritable bowel syndrome, jaundice, light color stool, liver problems, rectal bleeding, and  rectal pain. Preventive Screening-Counseling & Management      Drug Use:  no.      Current Medications (verified): 1)  Simvastatin 40 Mg  Tabs (Simvastatin) .... One By Mouth Once Daily 2)  Glimepiride 2 Mg  Tabs (Glimepiride) .... Take 1/2  By Mouth Two Times A Day 3)  Advair Diskus 250-50 Mcg/dose  Misc (Fluticasone-Salmeterol) .... One Puff Two Times A Day 4)  Low-Dose Aspirin 81 Mg  Tabs (Aspirin) .... One By Mouth Once Daily 5)  Cpap 10 Cm H20 .... At Bedtime 6)  Oxygen  2 To 3 Liters Pulsed .... As Needed 7)  Nulev 0.125 Mg Tbdp (Hyoscyamine Sulfate) .... Put One Under Tongue Every 4 Hours As Needed For Abd.pain. 8)  Lisinopril 20 Mg Tabs (Lisinopril) .... Take One By Mouth Once Daily 9)  Zemplar 2 Mcg Caps (Paricalcitol) .... Take One By Mouth Every Other Day 10)  Furosemide 20 Mg Tabs (Furosemide) .... Take One By Mouth Two Times A Day 11)  Apidra Solostar 100 Unit/ml Soln (Insulin Glulisine) .... Inject 15 Subq Three Times A Day With Meals 12)  Lantus 100 Unit/ml Soln (Insulin Glargine) .... Inject 10 Units Subq Two Times A Day 13)  Protonix 40 Mg Tbec (Pantoprazole Sodium) .... Take One By Mouth Once Daily 14)  Oxycodone-Acetaminophen 5-325 Mg Tabs (Oxycodone-Acetaminophen) .... Take One By Mouth Every 6 Hours As Needed 15)  Gas-X 80 Mg Chew (Simethicone) .... Chew 2-3 Per Day  Allergies (verified): No Known Drug Allergies  Past History:  Past Medical History: Reviewed history from 08/05/2009 and no changes required. 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) CHRONIC RESPIRATORY FAILURE (ICD-518.83) MORBID OBESITY (ICD-278.01) ABDOMINAL PAIN, EPIGASTRIC (ICD-789.06) ERECTILE DYSFUNCTION (ICD-302.72) THROMBOCYTOPENIA (ICD-287.5) ARTHRITIS (ICD-716.90) DEPRESSION (ICD-311) COPD (ICD-496)   -PFTs 10/13/07 FEV1 of 45%, ratio 63%, FVC 15% improvement after bronchodilators DLCO 45% SEIZURE  DISORDER (ICD-780.39) HYPERTENSION (ICD-401.9) HYPERLIPIDEMIA (ICD-272.4) DIABETES MELLITUS, TYPE II (ICD-250.00) ALLERGIC RHINITIS (ICD-477.9) TOBACCO USER (ICD-305.1) SLEEP APNEA (ICD-780.57)  Past Surgical History: Reviewed history from 08/05/2009 and no changes  required. bone marrow bx lumbar laminectomy at L4-L5 microdiskectomy at L4-L5  CABG  Family History: Asthma father,  smoked cigas Family History of Kidney Disease: brother died with throat cancer No FH of Colon Cancer:  Social History: Patient is a current smoker but cutting down Alcohol Use - no Illicit Drug Use - no Drug Use:  no  Review of Systems       The patient complains of allergy/sinus, arthritis/joint pain, back pain, change in vision, fatigue, fever, shortness of breath, and sleeping problems.  The patient denies anemia, anxiety-new, blood in urine, breast changes/lumps, confusion, cough, coughing up blood, depression-new, fainting, headaches-new, hearing problems, heart murmur, heart rhythm changes, itching, menstrual pain, muscle pains/cramps, night sweats, nosebleeds, pregnancy symptoms, skin rash, sore throat, swelling of feet/legs, swollen lymph glands, thirst - excessive , urination - excessive , urination changes/pain, urine leakage, vision changes, and voice change.    Vital Signs:  Patient profile:   70 year old male Height:      68 inches Weight:      240.6 pounds BMI:     36.72 Pulse rate:   100 / minute Pulse rhythm:   regular BP sitting:   100 / 68  (left arm) Cuff size:   regular  Vitals Entered By: Harlow Mares CMA Duncan Dull) (August 15, 2009 11:21 AM)   Impression & Recommendations:  Problem # 1:  ABDOMINAL PAIN RIGHT LOWER QUADRANT (ICD-789.03) Persistent right upper quadrant) and periumbilical  pain could be due to pancreatic pain,  although not typical.  Pain from chronic cholecystitis is also a consideration.  Pain from an active ulcer including posterior penetrating ulcer is also a consideration, especially in view of his history pancreatitis without clear etiology.  Recommendations #1 HIDA scan; if negative I will proceed with upper endoscopy  Problem # 2:  PANCREATITIS, ACUTE (ICD-577.0) Assessment: Improved Etiology unclear.  This has  clinically resolved.  Recommendations #12 consider MRCP if he develops recurrent pancreatitis or has ongoing pain without clear etiology  Problem # 3:  CHRONIC RESPIRATORY FAILURE (ICD-518.83) Assessment: Comment Only  Patient Instructions: 1)  Copy sent to : Sharlot Gowda, MD 2)  Your HIDA scan is scheduled at [facility] radiology on 08/25/2009 at 1pm 3)  The medication list was reviewed and reconciled.  All changed / newly prescribed medications were explained.  A complete medication list was provided to the patient / caregiver.  Appended Document: Orders Update    Clinical Lists Changes  Orders: Added new Referral order of HIDA Scan (HIDA SCAN) - Signed

## 2010-03-31 NOTE — Progress Notes (Signed)
Summary: TRIAGE--Abd Pain  Medications Added NULEV 0.125 MG TBDP (HYOSCYAMINE SULFATE) Put one under tongue every 4 hours as needed for abd.pain.       Phone Note Call from Patient Call back at Home Phone [phone]   Call For: Dr Arlyce Dice Summary of Call: Scheduled next available on 09-05-09 but wonders if we can see him sooner even with the PA Initial call taken by: Leanor Kail [facility],  Jul 21, 2009 12:19 PM  Follow-up for Phone Call        Says Dr.Lalonde put him on pantoprazole q.d. and Oxycodone.Advised to cont. to take  pantoprazole and start Nulev which he was advised to take at OV last year but did not take. however warning came up to not use over 65 so  pt.told I will send to Dr.Demoni Parmar to advise when he returns to office on Wed.   Told  he needs to contact Dr.Lalonde re: Oxycodone as Dr Arlyce Dice did not order it.l Follow-up by: Teryl Lucy RN,  Jul 21, 2009 3:37 PM  Additional Follow-up for Phone Call Additional follow up Details #1::        ok for nulev 0.125mg  SL Q4 hours as needed. Additional Follow-up by: Louis Meckel MD,  Jul 22, 2009 12:02 PM    Additional Follow-up for Phone Call Additional follow up Details #2::    Above MD orders reviewed with patient. Med. to pharmacy. Pt. instructed to call back as needed.   Follow-up by: Laureen Ochs LPN,  Jul 22, 2009 12:48 PM  New/Updated Medications: NULEV 0.125 MG TBDP (HYOSCYAMINE SULFATE) Put one under tongue every 4 hours as needed for abd.pain. Prescriptions: NULEV 0.125 MG TBDP (HYOSCYAMINE SULFATE) Put one under tongue every 4 hours as needed for abd.pain.  #30 x 1   Entered by:   Laureen Ochs LPN   Authorized by:   Louis Meckel MD   Signed by:   Laureen Ochs LPN on 46/96/2952   Method used:   Electronically to        [facility]  [address] [phone]* (retail)       [address]       [place], [place]  [address]       Ph: [phone] or [phone]       Fax: [phone]   RxID:    [id number]

## 2010-03-31 NOTE — Progress Notes (Signed)
Summary: Surgical Consult Scheduled   Phone Note Outgoing Call Call back at Home Phone [phone]   Call placed by: Laureen Ochs LPN,  August 26, 2009 4:08 PM Call placed to: Patient Summary of Call: Follow-up from HIDA Scan done 08-25-09, pt. needs a surgical consult with Dr. Johna Sheriff, for possible Cholecystectomy. Appt. is scheduled for 09-11-09 at 11am, arrive at 10:40am, at [facility].  Pt's # is disconnected, I will mail him the appt. information and continue to try and reach him by phone, this week. Initial call taken by: Laureen Ochs LPN,  August 26, 2009 4:14 PM  Follow-up for Phone Call        Pt's # is still disconnected. Laureen Ochs LPN  August 27, 2009 2:42 PM  # still disconnected. I mailed him appt. information yesterday. I will check back on his # next week. Follow-up by: Laureen Ochs LPN,  August 28, 2009 11:43 AM     Appended Document: Surgical Consult Scheduled Pt. came by the office for an update, I reviewed his surgical appt. info. with him. His phone # has also been updated in the computer. Pt. instructed to call back as needed.

## 2010-03-31 NOTE — Letter (Signed)
Summary: Appt Reminder 2  [facility]  [address] [place], [place] [address]   Phone: [phone]  Fax: [phone]        Jul 29, 2009 MRN: [id number]    [facility] [address] [place], [place]  [address]    Dear [name] [name],   You have a return appointment with Dr.Robert Arlyce Dice on 08-07-09 at 11am.  Please remember to bring a complete list of the medicines you are taking, your insurance card and your co-pay.  If you have to cancel or reschedule this appointment, please call before 5:00 pm the evening before to avoid a cancellation fee.  If you have any questions or concerns, please call [phone].    Sincerely,    Laureen Ochs LPN  Appended Document: Appt Reminder 2 Letter mailed to patient.

## 2010-03-31 NOTE — Assessment & Plan Note (Signed)
Summary: F/U PANCREATITIS                        DEBORAH    History of Present Illness Visit Type: Initial Visit Primary GI MD: Melvia Heaps MD [facility] Primary Provider: Sharlot Gowda, MD Requesting Provider: Sharlot Gowda, MD Chief Complaint: RLQ sharp intermittant abd pains x 6 weeks. Pt is here for post hosp pancreatitis.  History of Present Illness:   [name] [name] has returned following hospitalization in April, 2011 for abdominal pain.  This is the same pain that he has had for over 2 years.  Endoscopy in 2009 demonstrated a mild gastroduodenitis.  A CT scan during his hospitalization showed some prominence of the fat planes around the pancreatic head suggesting mild pancreatitis.  Serum lipase x2 was normal.  Pain continues in the periumbilical, epigastric, right upper quadrant and right lower quadrant and radiates around to the back.  It is unrelated to eating or bowel movements. He takes  narcotics chronically for persistent pain.  He complains of constipation.  The patient has oxygen-dependent COPD.  He is on insulin dependent diabetic.   GI Review of Systems    Reports abdominal pain and  loss of appetite.     Location of  Abdominal pain: RLQ.    Denies acid reflux, belching, bloating, chest pain, dysphagia with liquids, dysphagia with solids, heartburn, vomiting, vomiting blood, weight loss, and  weight gain.      Reports constipation.     Denies anal fissure, black tarry stools, change in bowel habit, diarrhea, diverticulosis, fecal incontinence, heme positive stool, hemorrhoids, irritable bowel syndrome, jaundice, light color stool, liver problems, rectal bleeding, and  rectal pain.    Current Medications (verified): 1)  Simvastatin 40 Mg  Tabs (Simvastatin) .... One By Mouth Once Daily 2)  Glimepiride 2 Mg  Tabs (Glimepiride) .... Take 1/2  By Mouth Two Times A Day 3)  Advair Diskus 250-50 Mcg/dose  Misc (Fluticasone-Salmeterol) .... One Puff Two Times A Day 4)  Low-Dose Aspirin 81  Mg  Tabs (Aspirin) .... One By Mouth Once Daily 5)  Cpap 10 Cm H20 .... At Bedtime 6)  Oxygen 2 To 3 Liters Pulsed .... As Needed 7)  Nulev 0.125 Mg Tbdp (Hyoscyamine Sulfate) .... Put One Under Tongue Every 4 Hours As Needed For Abd.pain. 8)  Lisinopril 20 Mg Tabs (Lisinopril) .... Take One By Mouth Once Daily 9)  Zemplar 2 Mcg Caps (Paricalcitol) .... Take One By Mouth Every Other Day 10)  Furosemide 20 Mg Tabs (Furosemide) .... Take One By Mouth Two Times A Day 11)  Apidra Solostar 100 Unit/ml Soln (Insulin Glulisine) .... Inject 15 Subq Three Times A Day With Meals 12)  Lantus 100 Unit/ml Soln (Insulin Glargine) .... Inject 10 Units Subq Two Times A Day 13)  Protonix 40 Mg Tbec (Pantoprazole Sodium) .... Take One By Mouth Once Daily 14)  Oxycodone-Acetaminophen 5-325 Mg Tabs (Oxycodone-Acetaminophen) .... Take One By Mouth Every 4 Hours As Needed  Allergies (verified): No Known Drug Allergies  Past History:  Past Medical History: Reviewed history from 08/05/2009 and no changes required. 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) CHRONIC RESPIRATORY FAILURE (ICD-518.83) MORBID OBESITY (ICD-278.01) ABDOMINAL PAIN, EPIGASTRIC (ICD-789.06) ERECTILE DYSFUNCTION (ICD-302.72) THROMBOCYTOPENIA (ICD-287.5) ARTHRITIS (ICD-716.90) DEPRESSION (ICD-311) COPD (ICD-496)   -PFTs 10/13/07 FEV1 of 45%, ratio  63%, FVC 15% improvement after bronchodilators DLCO 45% SEIZURE DISORDER (ICD-780.39) HYPERTENSION (ICD-401.9) HYPERLIPIDEMIA (ICD-272.4) DIABETES MELLITUS, TYPE II (ICD-250.00) ALLERGIC RHINITIS (ICD-477.9) TOBACCO USER (ICD-305.1) SLEEP APNEA (ICD-780.57)  Past Surgical History: Reviewed history from  08/05/2009 and no changes required. bone marrow bx lumbar laminectomy at L4-L5 microdiskectomy at L4-L5  CABG  Family History: Reviewed history from 08/05/2009 and no changes required. Asthma father,  smoked cigas Family History of Kidney Disease: two brother died with cancer  Social History: Reviewed history from 08/05/2009 and no changes required. Patient is a current smoker but cutting down Alcohol Use - no  Review of Systems       The patient complains of fever, shortness of breath, and sleeping problems.  The patient denies allergy/sinus, anemia, anxiety-new, arthritis/joint pain, back pain, blood in urine, breast changes/lumps, change in vision, confusion, cough, coughing up blood, depression-new, fainting, fatigue, headaches-new, hearing problems, heart murmur, heart rhythm changes, itching, menstrual pain, muscle pains/cramps, night sweats, nosebleeds, pregnancy symptoms, skin rash, sore throat, swelling of feet/legs, swollen lymph glands, thirst - excessive , urination - excessive , urination changes/pain, urine leakage, vision changes, and voice change.         All other systems were reviewed and were negative   Vital Signs:  Patient profile:   70 year old male Height:      68 inches Weight:      240.13 pounds BMI:     36.64 Pulse rate:   90 / minute Pulse rhythm:   regular BP sitting:   106 / 68  (right arm) Cuff size:   large  Vitals Entered By: Christie Nottingham CMA Duncan Dull) (August 05, 2009 2:07 PM)  Physical Exam  Additional Exam:  On physical exam he is a chronically ill-appearing male  skin: anicteric HEENT: normocephalic; PEERLA; no nasal or pharyngeal abnormalities neck: supple nodes: no cervical lymphadenopathy chest: breath sounds are decreased throughout heart: no murmurs, gallops, or rubs abd: soft, nontender; BS normoactive; no abdominal masses, tenderness, organomegaly rectal: deferred ext: no cynanosis, clubbing, edema skeletal: no  deformities neuro: oriented x 3; no focal abnormalities    Impression & Recommendations:  Problem # 1:  ABDOMINAL PAIN RIGHT LOWER QUADRANT (ICD-789.03) Etiology for his chronic abdominal pain is not certain.  I am not convinced that he had pancreatitis in April, 2011 as suggested by the CT scan.  Recommendations #1 followup CT scan #2 check amylase and lipase and LFTs #3 if CT scan is not diagnostic I would consider a trial of anticholinergics and possibly amitriptyline Orders: TLB-Hepatic/Liver Function Pnl (80076-HEPATIC) TLB-Amylase (82150-AMYL) TLB-Lipase (83690-LIPASE) TLB-Creatinine, Blood (82565-CREA) TLB-BUN (Urea Nitrogen) (84520-BUN) CT Abdomen/Pelvis with Contrast (CT Abd/Pelvis w/con)  Problem # 2:  GASTROPARESIS (ICD-536.3) Assessment: Comment Only  Problem # 3:  CHRONIC RESPIRATORY FAILURE (YQI-347.42) Assessment: Comment Only  Problem # 4:  DIABETES MELLITUS, TYPE II (ICD-250.00) Assessment: Comment Only  Patient Instructions: 1)  Go to the Basement for labs today. 2)  Go to [facility] on [address] for your CT scan 08/06/2009 arrive at 1:45pm, follow the seperate handout. 3)  cc Dr. Sharlot Gowda 4)  The medication list was reviewed and reconciled.  All changed / newly prescribed medications were explained.  A complete medication list was provided to the patient / caregiver.

## 2010-03-31 NOTE — Letter (Signed)
Summary: Colonoscopy Letter  [facility]  [address] [place], [place] [address]   Phone: [phone]  Fax: [phone]      October 01, 2009 MRN: [id number]   [facility] [address] [place], [place]  [address]   Dear [name] [name],   According to your medical record, it is time for you to schedule a Colonoscopy. The American Cancer Society recommends this procedure as a method to detect early colon cancer. Patients with a family history of colon cancer, or a personal history of colon polyps or inflammatory bowel disease are at increased risk.  This letter has been generated based on the recommendations made at the time of your procedure. If you feel that in your particular situation this may no longer apply, please contact our office.  Please call our office at [phone] to schedule this appointment or to update your records at your earliest convenience.  Thank you for cooperating with [place] to provide you with the very best care possible.   Sincerely,   Barbette Hair. Arlyce Dice, M.D.  [facility] [phone]

## 2010-03-31 NOTE — Discharge Summary (Signed)
NAME:  [name] [name], [name] [name] NO.:  [id number]      MEDICAL RECORD NO.:  [id number]          PATIENT TYPE:  INP      LOCATION:  1312                         FACILITY:  [facility]      PHYSICIAN:  Talmage Nap, MD  DATE OF BIRTH:  31-Jul-1940      DATE OF ADMISSION:  06/20/2009   DATE OF DISCHARGE:                                  DISCHARGE SUMMARY      ANTICIPATED DATE OF DISCHARGE:  June 23, 2009.      DISCHARGE DIAGNOSES:   1. Acute pancreatitis.   2. Hyperlipidemia.   3. Diabetes mellitus.   4. Dehydration.   5. Prerenal azotemia.   6. Polycythemia.   7. Thrombocytopenia.   8. Asthma.   9. Chronic obstructive pulmonary disease.   10.Coronary artery disease status post coronary artery bypass graft.   11.Obesity.   12.History of peptic ulcer disease.   13.History of prostate cancer.   14.History of chronic tobacco use.      The patient is a 70 year old obese African American male with history of   asthma, COPD, coronary artery disease status post CABG who was admitted   to the hospital by Dr. Pearson Grippe on June 20, 2009 with complaint of   abdominal pain located in the epigastric region which was said to be   constant.  He described the pain as sharp and was nonradiating.  He   denied any fever, chills or rigor.  He also denied any history of   diarrhea.  He denied any vomiting.  He denied any heartburn, but pain in   the epigastric region was said to have persisted; hence, the patient   presented to the emergency room to be evaluated.      PREADMISSION MEDICATIONS:   1. Aspirin 81 mg p.o. daily.   2. Advair Diskus (fluticasone/salmeterol) 250/50 1 puff b.i.d.   3. Lisinopril 20 mg one p.o. daily.   4. Simvastatin 40 mg one p.o. daily.   5. Zemplar (paricalcitol) 2 mcg one p.o. every other day.   6. Glimepiride (Amaryl) 2 mg one p.o. b.i.d.   7. Furosemide 20 mg 1-2 tablets p.o. daily.   8. Apidra (insulin glulisine) subcu 3 times daily with meals.   9.  Lantus (insulin glargine) 15 units subcu daily.      ALLERGIES:  He has no known allergies.      PAST SURGICAL HISTORY:   1. Coronary artery disease status post CABG.   2. Bone marrow biopsy.   3. Decompressive lumbar laminectomy at L4-L5 for spinal stenosis and       microdiskectomy at L4-L5 on the right for herniated disk done by       Dr. Ranee Gosselin on October 20, 2005.      SOCIAL HISTORY:  The patient lives at home.  He smoked for over 50   years, still smokes.  Denies any history of alcohol use.      FAMILY HISTORY:  Positive for coronary artery disease and chronic kidney   disease.  REVIEW OF SYSTEMS:  Essentially documented in the initial history and   physical.      At time the patient was seen by the admitting physician:   VITAL SIGNS:  Temperature 98.9, pulse 78, blood pressure 120/70, pulse   is 86% on room air.   HEENT:  Pupils were reactive to light and extraocular muscles intact.   NECK:  No jugular venous distention.  No carotid bruit.  No   lymphadenopathy.   CHEST:  Was said to be clear to auscultation.   HEART:  Sounds 1 and 2.   ABDOMEN:  Was said to be soft, nontender.  Liver, spleen tip not   palpable.  Bowel sounds are positive.   EXTREMITIES:  Showed no pedal edema.   NEUROLOGIC:  Nonfocal.   MUSCULOSKELETAL:  Unremarkable.   SKIN:  Shows decreased turgor.      LAB DATA:  Initial cardiac markers on admission, troponin I less than   0.05.  Comprehensive metabolic panel showed sodium of 138, potassium of   4.1, chloride of 95 with a bicarb of 35, glucose is 150, BUN 31,   creatinine is 2.16.  Lipase 104.  Urinalysis unremarkable.  Complete   blood count with differential showed WBC of 7.1, hemoglobin of 18.9,   hematocrit of 60.2, MCV of 97.4, platelet count of 61.  Amylase 104.  A   repeat lipase done on June 21, 2009 was 54.  Hemoglobin A1c 7.1.  Urine   culture no growth.  A repeat complete blood count with differential done   on June 22, 2009 showed WBC of 6.6, hemoglobin 17.4, hematocrit of   54.1, MCV of 97.1, platelet count of 60.  Magnesium level is 2.1.   Lipase 63.  A comprehensive metabolic panel showed sodium of 138,   potassium of 4.7, chloride of 102, bicarb 31, glucose 96, BUN 25,   creatinine is 1.87.      IMAGING STUDIES:  Acute abdominal series which showed no active lung   disease.  There is no bowel obstruction or any free air.  CT of the   abdomen and pelvis without contrast showed a pancreatic head is   prominent with fat plane surrounding the pancreatic head are indistinct.   Mild active pancreatitis of the head of the pancreas cannot be excluded.   There are multiple rectosigmoid colonic diverticula and a slight   thickened posterior wall of the urinary bladder.  Chest x-ray   essentially normal.      HOSPITAL COURSE:  The patient was admitted a regular floor.  He was made   n.p.o., started on normal saline to go at rate of 100 mL an hour, and he   was placed on Accu-Cheks with regular insulin sliding scale.   Thereafter, IV fluid was increased to normal saline to go at rate of 150   mL an hour.  He was also given breathing treatments for his asthma and   COPD.  The patient's pain was controlled with Dilaudid 0.5 to 1 mg IV   q.4 p.r.n.  At time the patient was seen by me for the very first time   which was on June 21, 2009, he expressed desire to quit smoking, and   subsequently nicotine patch 14 mg q.24 hours transdermal was ordered.   He was continued on IV hydration and then started on greater liquid   diet.  He was reevaluated by me today.  He complained about mild pain in  the abdomen but examination was unremarkable.  Vital Signs:  Blood   pressure is 120/70, temperature 97.7, pulse 79, respiratory rate 20.   The plan is to advance the patient's diet as tolerated.  We will restart   his medication which will include Lantus insulin subcu b.i.d., Apidra 20   units subcu a.c. with meals.  He  will also be on Lasix 20 mg p.o.   b.i.d., Benicar 40 mg daily and Zemplar 2 mg p.o. daily.  Other   medication to be given to the patient will include Zocor 40 mg p.o.   q.h.s. and he also will continue with his breathing treatment which will   include Spiriva, Advair and Xopenex.  The plan is for the patient to be   discharged on June 23, 2009 if medically stable, and on discharge his   medications will be dictated.               Talmage Nap, MD            CN/MEDQ  D:  06/22/2009  T:  06/22/2009  Job:  [id number]      cc:   Sharlot Gowda, M.D.   Fax: [phone]      Electronically Signed by Talmage Nap  on 06/22/2009 04:23:12 PM     NAME:  [name] [name], [name] [name]                ACCOUNT NO.:  [id number]      MEDICAL RECORD NO.:  [id number]          PATIENT TYPE:  INP      LOCATION:  1312                         [facility]ACILITY:  [facility]      PHYSICIAN:  Talmage Nap, MD  DATE OF BIRTH:  1940/10/25      DATE OF ADMISSION:  06/20/2009   DATE OF DISCHARGE:  06/23/2009                                  DISCHARGE SUMMARY      ADDENDUM:   For discharge diagnoses, please refer to my discharge summary dictated   on June 22, 2009.  The patient was, however, seen by me today which is   June 23, 2009, complaining about mild epigastric discomfort, but denied   any vomiting and denied any diarrhea or systemic symptoms.  Examination   of the patient was essentially unremarkable.  Vital Signs:  Blood   pressure is 138/78, temperature is 97.16, pulse 70, respiratory rate 20.   The patient is medically stable.      Plan is to discharge the patient home.  Activity as tolerated.  Low-   sodium, low-cholesterol diet as well as cardiac-prudent.  Daily weight   and fluid restriction.  He will be followed up by his primary care   physician in 1-2 weeks.  Medication to be taken at home will include:   1. Dilaudid 1 mg p.o. q.6 p.r.n.   2. Nicotine transdermal patch 14 mg q.24 hours.   3.  Pantoprazole 40 mg 1 tablet p.o. b.i.d. with meals.   4. Advair Diskus (fluticasone/salmeterol) 250/50 one puff b.i.d.   5. Apidra 20 units subcu t.i.d. with meals.   6. Aspirin 81 mg 1 p.o. daily.   7. Furosemide 20 mg 1-2 tablets p.o. b.i.d.  8. Glimepiride (Amaryl) 2 mg 1 tablet by mouth b.i.d.   9. Lantus insulin 15 units subcu b.i.d.   10.Lisinopril 20 mg 1 p.o. daily.   11.Simvastatin 40 mg p.o. daily.   12.Zemplar (paricalcitol) 2 mcg 1 p.o. every other day.               Talmage Nap, MD            CN/MEDQ  D:  06/23/2009  T:  06/23/2009  Job:  [id number]      cc:   Sharlot Gowda, M.D.   Fax: [phone]      Electronically Signed by Talmage Nap  on 06/23/2009 05:38:47 PM

## 2010-04-02 NOTE — Progress Notes (Signed)
Summary: pt in lobby needs sample  Phone Note Call from Patient   Caller: Patient Call For: Dalisha Shively Reason for Call: Talk to Nurse Summary of Call: Patient in lobby and is asking for sample of advair. Initial call taken by: Lehman Prom,  March 23, 2010 9:39 AM  Follow-up for Phone Call        Pt scheduled to be seen 04/09/2010. (2) boxes given of Advair 250/50. Zackery Barefoot CMA  March 23, 2010 9:49 AM

## 2010-04-03 NOTE — Letter (Signed)
Summary: [facility]  [facility]   Imported By: Lanelle Bal 09/26/2009 13:24:40  _____________________________________________________________________  External Attachment:    Type:   Image     Comment:   External Document

## 2010-04-08 ENCOUNTER — Inpatient Hospital Stay (HOSPITAL_COMMUNITY)
Admission: EM | Admit: 2010-04-08 | Discharge: 2010-04-10 | DRG: 440 | Disposition: A | Discharge status: Home / Self Care | Payer: Medicare PPO | Attending: Family Medicine | Admitting: Family Medicine

## 2010-04-08 ENCOUNTER — Emergency Department (HOSPITAL_COMMUNITY): Payer: Medicare PPO

## 2010-04-08 DIAGNOSIS — Z794 Long term (current) use of insulin: Secondary | ICD-10-CM

## 2010-04-08 DIAGNOSIS — J4489 Other specified chronic obstructive pulmonary disease: Secondary | ICD-10-CM | POA: Diagnosis present

## 2010-04-08 DIAGNOSIS — M542 Cervicalgia: Secondary | ICD-10-CM | POA: Diagnosis present

## 2010-04-08 DIAGNOSIS — E785 Hyperlipidemia, unspecified: Secondary | ICD-10-CM | POA: Diagnosis present

## 2010-04-08 DIAGNOSIS — N183 Chronic kidney disease, stage 3 unspecified: Secondary | ICD-10-CM | POA: Diagnosis present

## 2010-04-08 DIAGNOSIS — J449 Chronic obstructive pulmonary disease, unspecified: Secondary | ICD-10-CM | POA: Diagnosis present

## 2010-04-08 DIAGNOSIS — M109 Gout, unspecified: Secondary | ICD-10-CM | POA: Diagnosis present

## 2010-04-08 DIAGNOSIS — K859 Acute pancreatitis without necrosis or infection, unspecified: Principal | ICD-10-CM | POA: Diagnosis present

## 2010-04-08 DIAGNOSIS — Z951 Presence of aortocoronary bypass graft: Secondary | ICD-10-CM

## 2010-04-08 DIAGNOSIS — Z9981 Dependence on supplemental oxygen: Secondary | ICD-10-CM

## 2010-04-08 DIAGNOSIS — G4733 Obstructive sleep apnea (adult) (pediatric): Secondary | ICD-10-CM | POA: Diagnosis present

## 2010-04-08 DIAGNOSIS — E669 Obesity, unspecified: Secondary | ICD-10-CM | POA: Diagnosis present

## 2010-04-08 DIAGNOSIS — I2510 Atherosclerotic heart disease of native coronary artery without angina pectoris: Secondary | ICD-10-CM | POA: Diagnosis present

## 2010-04-08 DIAGNOSIS — E119 Type 2 diabetes mellitus without complications: Secondary | ICD-10-CM | POA: Diagnosis present

## 2010-04-08 DIAGNOSIS — Z8546 Personal history of malignant neoplasm of prostate: Secondary | ICD-10-CM

## 2010-04-08 HISTORY — DX: Disorder of kidney and ureter, unspecified: N28.9

## 2010-04-08 LAB — DIFFERENTIAL
Basophils Absolute: 0 10*3/uL (ref 0.0–0.1)
Eosinophils Absolute: 0.1 10*3/uL (ref 0.0–0.7)
Eosinophils Relative: 1 % (ref 0–5)
Lymphocytes Relative: 29 % (ref 12–46)
Monocytes Absolute: 0.9 10*3/uL (ref 0.1–1.0)

## 2010-04-08 LAB — CBC
HCT: 61.8 % — ABNORMAL HIGH (ref 39.0–52.0)
MCHC: 33.3 g/dL (ref 30.0–36.0)
MCV: 94.1 fL (ref 78.0–100.0)
Platelets: 85 10*3/uL — ABNORMAL LOW (ref 150–400)
RDW: 13.9 % (ref 11.5–15.5)

## 2010-04-09 ENCOUNTER — Inpatient Hospital Stay (HOSPITAL_COMMUNITY): Payer: Medicare PPO

## 2010-04-09 ENCOUNTER — Ambulatory Visit: Payer: Self-pay | Admitting: Internal Medicine

## 2010-04-09 ENCOUNTER — Encounter (HOSPITAL_COMMUNITY): Payer: Self-pay

## 2010-04-09 LAB — COMPREHENSIVE METABOLIC PANEL
ALT: 16 U/L (ref 0–53)
AST: 15 U/L (ref 0–37)
Albumin: 3.6 g/dL (ref 3.5–5.2)
BUN: 36 mg/dL — ABNORMAL HIGH (ref 6–23)
CO2: 36 mEq/L — ABNORMAL HIGH (ref 19–32)
CO2: 32 mEq/L (ref 19–32)
Calcium: 10.5 mg/dL (ref 8.4–10.5)
Calcium: 9.5 mg/dL (ref 8.4–10.5)
Creatinine, Ser: 2.17 mg/dL — ABNORMAL HIGH (ref 0.4–1.5)
Creatinine, Ser: 1.9 mg/dL — ABNORMAL HIGH (ref 0.4–1.5)
GFR calc Af Amer: 42 mL/min — ABNORMAL LOW (ref >60)
GFR calc non Af Amer: 30 mL/min — ABNORMAL LOW (ref >60)
Glucose, Bld: 106 mg/dL — ABNORMAL HIGH (ref 70–99)
Sodium: 134 mEq/L — ABNORMAL LOW (ref 135–145)

## 2010-04-09 LAB — CBC
Hemoglobin: 18.3 g/dL — ABNORMAL HIGH (ref 13.0–17.0)
MCH: 30.6 pg (ref 26.0–34.0)
MCHC: 32.4 g/dL (ref 30.0–36.0)
Platelets: 75 10*3/uL — ABNORMAL LOW (ref 150–400)
RBC: 5.99 MIL/uL — ABNORMAL HIGH (ref 4.22–5.81)

## 2010-04-09 LAB — URINALYSIS, ROUTINE W REFLEX MICROSCOPIC
Bilirubin Urine: NEGATIVE
Hgb urine dipstick: NEGATIVE
Nitrite: NEGATIVE
Protein, ur: NEGATIVE mg/dL
Urobilinogen, UA: 0.2 mg/dL (ref 0.0–1.0)

## 2010-04-09 LAB — GLUCOSE, CAPILLARY
Glucose-Capillary: 130 mg/dL — ABNORMAL HIGH (ref 70–99)
Glucose-Capillary: 213 mg/dL — ABNORMAL HIGH (ref 70–99)

## 2010-04-09 LAB — LIPASE, BLOOD: Lipase: 66 U/L — ABNORMAL HIGH (ref 11–59)

## 2010-04-10 LAB — COMPREHENSIVE METABOLIC PANEL
ALT: 16 U/L (ref 0–53)
AST: 14 U/L (ref 0–37)
Albumin: 3.3 g/dL — ABNORMAL LOW (ref 3.5–5.2)
CO2: 31 mEq/L (ref 19–32)
Chloride: 98 mEq/L (ref 96–112)
Creatinine, Ser: 2.05 mg/dL — ABNORMAL HIGH (ref 0.4–1.5)
GFR calc Af Amer: 39 mL/min — ABNORMAL LOW (ref >60)
Potassium: 4.1 mEq/L (ref 3.5–5.1)
Sodium: 137 mEq/L (ref 135–145)
Total Bilirubin: 0.6 mg/dL (ref 0.3–1.2)

## 2010-04-10 LAB — CBC
Hemoglobin: 17.9 g/dL — ABNORMAL HIGH (ref 13.0–17.0)
Platelets: 93 10*3/uL — ABNORMAL LOW (ref 150–400)
RBC: 5.7 MIL/uL (ref 4.22–5.81)
WBC: 7.3 10*3/uL (ref 4.0–10.5)

## 2010-04-10 LAB — GLUCOSE, CAPILLARY
Glucose-Capillary: 136 mg/dL — ABNORMAL HIGH (ref 70–99)
Glucose-Capillary: 163 mg/dL — ABNORMAL HIGH (ref 70–99)

## 2010-04-10 LAB — LIPID PANEL
Cholesterol: 128 mg/dL (ref 0–200)
LDL Cholesterol: 50 mg/dL (ref 0–99)
Total CHOL/HDL Ratio: 4.3 RATIO
Triglycerides: 240 mg/dL — ABNORMAL HIGH (ref <150)

## 2010-04-12 NOTE — H&P (Signed)
NAME:  [name] [name], [name] [name]                ACCOUNT NO.:  [id number]  MEDICAL RECORD NO.:  [id number]           PATIENT TYPE:  E  LOCATION:  WLED                         FACILITY:  [facility]  PHYSICIAN:  Conley Canal, MD      DATE OF BIRTH:  05/25/1940  DATE OF ADMISSION:  04/08/2010 DATE OF DISCHARGE:                             HISTORY & PHYSICAL   PRIMARY CARE PHYSICIAN:  Dr. Sharlot Gowda, M.D.  CHIEF COMPLAINT:  Abdominal pain for 1 week.  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 He came to the hospital today because he could not withstand the pain. The pain was about 8 out of 10 in intensity, it was dull.  No relieving or aggravating factors.  Not associated with any nausea, vomiting, or diarrhea.  No fever.  In the emergency room, the patient was found to have acute pancreatitis.  He has had pancreatitis in the past.  PAST MEDICAL HISTORY: 1. Hypertension. 2. Hyperlipidemia. 3. Diabetes mellitus type 2. 4. CKD stage 3. 5. COPD. 6. Chronic back pain. 7. CAD status post CABG. 8. History of prostate cancer. 9. Peptic ulcer disease. 10.Secondary polycythemia.  ALLERGIES:  No known drug allergies.  HOME MEDICATIONS:  Oxygen, lisinopril, simvastatin, Lasix, glimepiride, aspirin, allopurinol, Advair, Lantus, Zemplar, Apidra.  REVIEW OF SYSTEMS:  Unremarkable except as highlighted in the history of present illness.  SOCIAL HISTORY:  The patient lives alone.  He still smokes cigarettes, quit drinking about 40 years ago.  Denies illicit drugs.  FAMILY HISTORY:  Mother had coronary artery disease.  PHYSICAL EXAMINATION:   GENERAL:  On examination, this is a middle-aged male.  He is not in acute distress. VITAL SIGNS:  Blood pressure 111/66, heart rate is 97, temperature 98.1, respirations 18, oxygen saturation is 95% on 2 liters nasal cannula. HEAD, EARS, EYES, NOSE AND THROAT:  Pupils equal, reacting to light. NECK:  No jugular venous distention.  No carotid bruits. RESPIRATORY SYSTEM:  Good air entry bilaterally with no rhonchi, rales, or wheezes. CARDIOVASCULAR SYSTEM:  First and second heart sounds heard.  No murmurs.  Pulse regular. ABDOMEN:  Morbidly obese; otherwise soft, nontender.  No palpable organomegaly.  Bowel sounds are normal. CNS:  The patient is alert and oriented to person, place, and time with no acute focal neurological deficits. EXTREMITIES:  There is no pedal edema.  Peripheral pulses are equal.  LABORATORY DATA:  WBC is 9.9, hemoglobin 20.6, hematocrit 61.8, platelet count 85.  Sodium 139, potassium 4.1, BUN 36, creatinine 2.17, glucose 106, calcium 10.5, lipase 66.  Liver function tests unremarkable. Urinalysis negative.  CT abdomen and pelvis shows abnormal stranding adjacent to the pancreatic head, possibly from localized pancreatitis and less likely to be secondary to duodenal pathology and stable left adrenal mass, sigmoid diverticulosis without active diverticulitis.  CT of C-spine without contrast shows  cervical spondylosis particularly at C5-C6, C6-C7, and 2 mm grade 1 anterior subluxation of C3 on C4 and C4 on C5.  IMPRESSION:  A 70 year old male with multiple comorbidities who is presenting with acute pancreatitis; etiology not clear, might be related to the polycythemia as the patient has had previous history versus secondary to hyperlipidemia versus idiopathic.  PLAN: 1. Acute pancreatitis.  The patient feels hungry.  We will admit,     place on IV fluids; place on clear liquids, if he is tolerating.     Consider MRCP, if not improving. 2. Diabetes mellitus type  2.  Resume home medications. 3. Hyperlipidemia.  Resume Zocor. 4. COPD.  Continue oxygen, Advair. 5. CKD stage 3, seems stable.  Gently rehydrate and monitor.  Held     Lasix at this point. 6. DVT, GI prophylaxis. 7. Patient's condition is stable.     Conley Canal, MD     SR/MEDQ  D:  04/09/2010  T:  04/09/2010  Job:  [id number]  cc:   Sharlot Gowda, M.D. Fax: [phone]  Electronically Signed by Conley Canal  on 04/09/2010 08:30:55 PM

## 2010-04-15 ENCOUNTER — Ambulatory Visit (INDEPENDENT_AMBULATORY_CARE_PROVIDER_SITE_OTHER): Payer: Medicare PPO | Admitting: Family Medicine

## 2010-04-15 DIAGNOSIS — M542 Cervicalgia: Secondary | ICD-10-CM

## 2010-04-15 DIAGNOSIS — K859 Acute pancreatitis without necrosis or infection, unspecified: Secondary | ICD-10-CM

## 2010-04-15 DIAGNOSIS — E119 Type 2 diabetes mellitus without complications: Secondary | ICD-10-CM

## 2010-04-15 DIAGNOSIS — J449 Chronic obstructive pulmonary disease, unspecified: Secondary | ICD-10-CM

## 2010-04-17 NOTE — Discharge Summary (Signed)
NAME:  [name] [name], [name] [name]                ACCOUNT NO.:  [id number]  MEDICAL RECORD NO.:  [id number]           PATIENT TYPE:  I  LOCATION:  1616                         FACILITY:  [facility]  PHYSICIAN:  Pleas Koch, MD        DATE OF BIRTH:  04-May-1940  DATE OF ADMISSION:  04/08/2010 DATE OF DISCHARGE:  04/10/2010                              DISCHARGE SUMMARY   PRIMARY CARE PHYSICIAN:  Sharlot Gowda, M.D.  DISCHARGE DIAGNOSES: 1. Pancreatitis, acute. 2. 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 3. Diabetes mellitus, A1c 7.7. 4. Hyperlipidemia with triglycerides at 240. 5. Neck pain. 6. Gout. 7. Obesity. 8. Chronic kidney disease, stage III. 9. Morbid obesity.  PERTINENT RADIOLOGICAL IMAGING STUDIES: 1. CT C-spine done on April 09, 2010, showed 2-mm grade 1 anterior     subluxation of C3 on C4 and C4 on C5 possibly due to some mild     degenerative anterior subluxation.  No acute fracture or     subluxation at that time. 2. Cervical spondylosis, particularly at C5-C6 and C6-C7. 3. CT abdomen and pelvis showed abnormal stranding adjacent to     pancreatic head possibly from localized pancreatitis, less likely     secondary duodenal pathology.  Correlated with pancreatic enzymes.     Stable left adrenal mass.  Sigmoid diverticulosis without active     diverticulitis. 4. Chest x-ray 2-view, one view showed no acute cardiopulmonary     process.  Mild peribronchial thickening which may be due to chronic     bronchitis from smoking.  DISCHARGE MEDICATIONS: 1. Lexapro 40 mg 1 tablet daily. 2. Allopurinol 100 mg p.o. 2 tablets daily. 3. Colchicine 0.6 mg p.o. 1 tablet daily. 4. Magnesium 30 mL daily p.r.n. 5. Flonase 2 sprays daily. 6. Zocor 40 mg 1 tablet at bedtime. 7. Apidra subcutaneously 20 units t.i.d. with meals. 8. Lantus subcutaneously 15 units b.i.d. 9. Lasix 40 mg 2 tablets in the morning and 1 tablet in the evening. 10.Aspirin 81 mg 1  tablet a day. 11.Advair Diskus 250/50 inhaled 1 puff b.i.d. 12.Amaryl 2 mg 1 tablet p.o. b.i.d. 13.Visine to both eyes 1 drop daily p.r.n. 14.Zemplar paricalcitol 2 mcg 1 tablet every other day.  MEDICATIONS ADDED:  Oxycodone/acetaminophen 5/325 q.6h. p.r.n.  BRIEF HOSPITAL HISTORY:  The patient is a pleasant 70 year old male with multiple comorbidities as noted above who presented with diffuse abdominal pain ongoing for the past 1 week.  He came to the hospital the date of admission because he could not withstand the pain.  Pain was 8/10 in the morning in intensity, and it was dull, no relieving or aggravating factors.  No nausea, vomiting, or diarrhea.  No fever.  He was found to have acute pancreatitis and has pancreatitis in the past.  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  CT as above of abdomen and neck as above.  HOSPITAL COURSE: 1. Pancreatitis.  The patient was actually hungry on the  day ofadmission and tolerated soft diet and liquids on admission.  He was     graduated up to full diet on second day and tolerated this well.     As such, it was thought to be reasonable to let him go home.  He     will be discharged home on limited dose of Percocet and likely will     need to follow up with gastroenterology.  It is noted that he has     had history of colonic polyps removed in 2003 by Dr. Arlyce Dice and     will need a followup colonoscopy in about 3-5 years and that would     place him at about now.  He has also had episodes of abdominal pain     that has been reviewed by them which was thought to be postprandial     and at one point placed on NuLev 0.25 mg.  He actually has upper     endoscopy demonstrating nonspecific duodenitis and gastritis.  He     clearly will need close gastroenterological followup. 2. GOLD criterion COPD, stage III-IV.  The  patient is on chronic     oxygen and on nebulization.  It looks like he has had nocturnal     polysomnogram by Dr. Sherene Sires in 2007 demonstrating very severe     obstruction sleep apnea.  He will need to continue CPAP as an     outpatient if he does use this and will continue on his regular     scheduled medications. 3. Diabetes mellitus.  His A1c done here was 7.7.  He demonstrated     good inpatient control.  Will continue on his Apidra, Lantus, and     his Amaryl as per prior medication reconciliation. 4. Hyperlipidemia.  He has hypertriglyceridemia and it would be good     idea to continue his simvastatin.  Likely, he may benefit from     addition of niacin, although evidence is unclear as to the benefit     of this. 5. Neck pain.  The patient complained of neck pain in addition to his     abdominal pain.  He stated good relief with cyclobenzaprine.  As     such, I have added this to his medications, given him a 10-day     supply.  He can follow up with Dr. [name] if he gets benefit with     this and may need outpatient PT/OT. 6. Gout.  The patient was continued on his allopurinol and colchicine.     The patient remarks that he was told by nephrologist to discontinue     the same and I think that risks versus benefits of this needs to be     discussed with him in detail.  Certainly, he can keep a limited     dose colchicine at home and continue this if he does have a flare-     up gout. 7. Obesity.  He will need to be counseled as an outpatient regarding     weight loss. 8. CKD, stage III.  The patient will follow up with Dr. Darrick Penna for     the same.  This is stable in the hospital, and creatinine was     around 2.05.  I have kept him on his Lasix even though it was held     initially on admission. 9. CAD.  The patient looks like he had a cardiac catheterization in  2010 with Dr. Verdis Prime showing severe native vessel coronary     artery disease, total occlusion of LAD, and  total functional     occlusion of first obtuse and RCA.  The patient also had a fully     occluded LAD as well.  His EF at that time was 55%.  The patient     will need to follow up with cardiology as an outpatient as well for     secondary prevention.  The patient was discharged home stable.  Blood pressure was 109/58, respirations 16, pulse rate 82, and temperature 98.5.  He was satting about 88% to 92% on room air.          ______________________________ Pleas Koch, MD     JS/MEDQ  D:  04/10/2010  T:  04/10/2010  Job:  [id number]  cc:   Sharlot Gowda, M.D. Fax: [phone]  Dr. Darrick Penna  Electronically Signed by Pleas Koch MD on 04/17/2010 03:07:00 PM

## 2010-04-22 ENCOUNTER — Telehealth (INDEPENDENT_AMBULATORY_CARE_PROVIDER_SITE_OTHER): Payer: Self-pay | Admitting: *Deleted

## 2010-04-28 NOTE — Progress Notes (Signed)
Summary: advair sample- pt in lobby waiting  Phone Note Call from Patient   Caller: Patient Call For: wert Summary of Call: pt walked requesting advair sample. he is in lobby waiting.  Initial call taken by: Tivis Ringer, CNA,  April 22, 2010 3:02 PM  Follow-up for Phone Call        Pt not seen since 2009 by MW and canceled his last appt after we gave him samples back in Jan!! Needs appt for med refills. Appt sched for 05/13/10.  Follow-up by: Vernie Murders,  April 22, 2010 3:56 PM

## 2010-05-04 ENCOUNTER — Encounter: Payer: Self-pay | Admitting: Gastroenterology

## 2010-05-04 ENCOUNTER — Ambulatory Visit (INDEPENDENT_AMBULATORY_CARE_PROVIDER_SITE_OTHER): Payer: Medicare PPO | Admitting: Family Medicine

## 2010-05-04 DIAGNOSIS — R109 Unspecified abdominal pain: Secondary | ICD-10-CM

## 2010-05-04 DIAGNOSIS — Z79899 Other long term (current) drug therapy: Secondary | ICD-10-CM

## 2010-05-04 DIAGNOSIS — J9600 Acute respiratory failure, unspecified whether with hypoxia or hypercapnia: Secondary | ICD-10-CM

## 2010-05-13 ENCOUNTER — Ambulatory Visit (INDEPENDENT_AMBULATORY_CARE_PROVIDER_SITE_OTHER): Payer: Medicare PPO | Admitting: Internal Medicine

## 2010-05-13 ENCOUNTER — Encounter: Payer: Self-pay | Admitting: Internal Medicine

## 2010-05-13 DIAGNOSIS — J449 Chronic obstructive pulmonary disease, unspecified: Secondary | ICD-10-CM

## 2010-05-18 ENCOUNTER — Telehealth: Payer: Self-pay

## 2010-05-19 ENCOUNTER — Telehealth: Payer: Self-pay

## 2010-05-19 LAB — DIFFERENTIAL
Basophils Absolute: 0 10*3/uL (ref 0.0–0.1)
Basophils Absolute: 0 10*3/uL (ref 0.0–0.1)
Basophils Absolute: 0 10*3/uL (ref 0.0–0.1)
Basophils Relative: 1 % (ref 0–1)
Basophils Relative: 0 % (ref 0–1)
Basophils Relative: 0 % (ref 0–1)
Basophils Relative: 0 % (ref 0–1)
Basophils Relative: 1 % (ref 0–1)
Eosinophils Absolute: 0.1 10*3/uL (ref 0.0–0.7)
Eosinophils Absolute: 0.1 10*3/uL (ref 0.0–0.7)
Eosinophils Absolute: 0.1 10*3/uL (ref 0.0–0.7)
Eosinophils Relative: 1 % (ref 0–5)
Eosinophils Relative: 1 % (ref 0–5)
Eosinophils Relative: 1 % (ref 0–5)
Eosinophils Relative: 1 % (ref 0–5)
Lymphocytes Relative: 31 % (ref 12–46)
Lymphocytes Relative: 32 % (ref 12–46)
Lymphs Abs: 2.3 10*3/uL (ref 0.7–4.0)
Monocytes Absolute: 0.6 10*3/uL (ref 0.1–1.0)
Monocytes Absolute: 0.9 10*3/uL (ref 0.1–1.0)
Monocytes Relative: 10 % (ref 3–12)
Neutro Abs: 5.4 10*3/uL (ref 1.7–7.7)
Neutrophils Relative %: 63 % (ref 43–77)
Neutrophils Relative %: 72 % (ref 43–77)

## 2010-05-19 LAB — COMPREHENSIVE METABOLIC PANEL
ALT: 22 U/L (ref 0–53)
ALT: 21 U/L (ref 0–53)
ALT: 22 U/L (ref 0–53)
ALT: 22 U/L (ref 0–53)
ALT: 25 U/L (ref 0–53)
AST: 24 U/L (ref 0–37)
AST: 31 U/L (ref 0–37)
AST: 34 U/L (ref 0–37)
Albumin: 3.4 g/dL — ABNORMAL LOW (ref 3.5–5.2)
Alkaline Phosphatase: 55 U/L (ref 39–117)
Alkaline Phosphatase: 59 U/L (ref 39–117)
Alkaline Phosphatase: 61 U/L (ref 39–117)
Alkaline Phosphatase: 77 U/L (ref 39–117)
CO2: 26 mEq/L (ref 19–32)
CO2: 30 mEq/L (ref 19–32)
CO2: 31 mEq/L (ref 19–32)
CO2: 35 mEq/L — ABNORMAL HIGH (ref 19–32)
Calcium: 9.8 mg/dL (ref 8.4–10.5)
Calcium: 8.5 mg/dL (ref 8.4–10.5)
Chloride: 98 mEq/L (ref 96–112)
Chloride: 99 mEq/L (ref 96–112)
Creatinine, Ser: 1.75 mg/dL — ABNORMAL HIGH (ref 0.4–1.5)
GFR calc Af Amer: 39 mL/min — ABNORMAL LOW (ref >60)
GFR calc Af Amer: 47 mL/min — ABNORMAL LOW (ref >60)
GFR calc non Af Amer: 29 mL/min — ABNORMAL LOW (ref >60)
GFR calc non Af Amer: 30 mL/min — ABNORMAL LOW (ref >60)
GFR calc non Af Amer: 33 mL/min — ABNORMAL LOW (ref >60)
GFR calc non Af Amer: 39 mL/min — ABNORMAL LOW (ref >60)
Glucose, Bld: 78 mg/dL (ref 70–99)
Glucose, Bld: 150 mg/dL — ABNORMAL HIGH (ref 70–99)
Potassium: 4.1 mEq/L (ref 3.5–5.1)
Potassium: 4.7 mEq/L (ref 3.5–5.1)
Potassium: 4.7 mEq/L (ref 3.5–5.1)
Potassium: 4.8 mEq/L (ref 3.5–5.1)
Sodium: 136 mEq/L (ref 135–145)
Sodium: 137 mEq/L (ref 135–145)
Sodium: 138 mEq/L (ref 135–145)
Sodium: 138 mEq/L (ref 135–145)
Total Bilirubin: 2 mg/dL — ABNORMAL HIGH (ref 0.3–1.2)
Total Protein: 6.3 g/dL (ref 6.0–8.3)

## 2010-05-19 LAB — CBC
HCT: 62.1 % — ABNORMAL HIGH (ref 39.0–52.0)
HCT: 54.1 % — ABNORMAL HIGH (ref 39.0–52.0)
Hemoglobin: 20 g/dL — ABNORMAL HIGH (ref 13.0–17.0)
Hemoglobin: 17.4 g/dL — ABNORMAL HIGH (ref 13.0–17.0)
Hemoglobin: 18 g/dL — ABNORMAL HIGH (ref 13.0–17.0)
Hemoglobin: 18.9 g/dL — ABNORMAL HIGH (ref 13.0–17.0)
MCHC: 32.1 g/dL (ref 30.0–36.0)
MCHC: 31.9 g/dL (ref 30.0–36.0)
MCHC: 32.1 g/dL (ref 30.0–36.0)
MCV: 94.9 fL (ref 78.0–100.0)
MCV: 96.7 fL (ref 78.0–100.0)
RBC: 6.54 MIL/uL — ABNORMAL HIGH (ref 4.22–5.81)
RBC: 5.86 MIL/uL — ABNORMAL HIGH (ref 4.22–5.81)
RBC: 6.18 MIL/uL — ABNORMAL HIGH (ref 4.22–5.81)
RDW: 14.6 % (ref 11.5–15.5)
RDW: 14.7 % (ref 11.5–15.5)
WBC: 6.3 10*3/uL (ref 4.0–10.5)
WBC: 7.1 10*3/uL (ref 4.0–10.5)

## 2010-05-19 LAB — URINALYSIS, ROUTINE W REFLEX MICROSCOPIC
Bilirubin Urine: NEGATIVE
Glucose, UA: NEGATIVE mg/dL
Hgb urine dipstick: NEGATIVE
Ketones, ur: NEGATIVE mg/dL
Protein, ur: NEGATIVE mg/dL

## 2010-05-19 LAB — LIPASE, BLOOD
Lipase: 111 U/L — ABNORMAL HIGH (ref 11–59)
Lipase: 104 U/L — ABNORMAL HIGH (ref 11–59)

## 2010-05-19 LAB — GLUCOSE, CAPILLARY
Glucose-Capillary: 101 mg/dL — ABNORMAL HIGH (ref 70–99)
Glucose-Capillary: 193 mg/dL — ABNORMAL HIGH (ref 70–99)

## 2010-05-19 LAB — URINE CULTURE: Culture: NO GROWTH

## 2010-05-19 LAB — POCT CARDIAC MARKERS: Myoglobin, poc: 177 ng/mL (ref 12–200)

## 2010-05-19 LAB — PROTIME-INR: Prothrombin Time: 14.2 seconds (ref 11.6–15.2)

## 2010-05-19 LAB — MAGNESIUM: Magnesium: 1.9 mg/dL (ref 1.5–2.5)

## 2010-05-19 NOTE — Telephone Encounter (Signed)
Left message for patient that Dr. Arlyce Dice wants to see him for an office visit. Patient had told me yesterday that he did not have the money for his copay. Spoke with the front office and the pt may be seen 1 time and the copay be waived. Left message for patient to call me back to discuss the copay and to make the appointment.

## 2010-05-19 NOTE — Telephone Encounter (Signed)
Patient scheduled for appt with Dr. Arlyce Dice for 05/22/10@ 3:15pm. Pt aware of appt date and time.

## 2010-05-19 NOTE — Assessment & Plan Note (Signed)
Summary: Pulmonary/ ext ov with smoking counseling     Copy to:  Sharlot Gowda, MD Primary Provider/Referring Provider:  Sharlot Gowda, MD  CC:  Follow up visit-COPD-states wheezing with sleep and weakness. Recent hospital stay for COPD/emphysema.  History of Present Illness: 67 yobm  with morbid obesity actively smoking last seen here 12/16/05 with an FEV1 of 45% predicted not consistently using oxygen or CPAP for sleep apnea.  I gave him the fork in the road talk regarding taking better care of himself following medical advice including stopping smoking but he did not return until now because until now we had been refilling his medications.  I note also that he is failed to follow up with Dr. Artist Pais.    During the last year he has had a progressive decline in best a function to the point where he was  having difficulty walking from the mailbox back to the house without stopping to catch his breath when seen 03/28/07 instructed on optimal mdi technique  and asked to schedule fu pft's.   October 13, 2007 has cut down on smoking now able to walk half a block on 02 2lpm  May 13, 2010 ov Follow up visit-COPD-states wheezing with sleep and weakness. Recent hospital  2/8-10 for pancreatitis  some better since discharge. no discolored mucus. Pt denies any significant sore throat, dysphagia, itching, sneezing,  nasal congestion or excess secretions,  fever, chills, sweats, unintended wt loss, pleuritic or exertional cp, hempoptysis, change in activity tolerance  orthopnea pnd or leg swelling Pt also denies any obvious fluctuation in symptoms with weather or environmental change or other alleviating or aggravating factors.         Preventive Screening-Counseling & Management  Alcohol-Tobacco     Smoking Status: current-approx 60 years     Packs/Day: 1.0  Current Medications (verified): 1)  Simvastatin 40 Mg  Tabs (Simvastatin) .... One By Mouth Once Daily 2)  Glimepiride 2 Mg  Tabs (Glimepiride) ....  Take 1/2  By Mouth Two Times A Day 3)  Advair Diskus 250-50 Mcg/dose  Misc (Fluticasone-Salmeterol) .... One Puff Two Times A Day 4)  Low-Dose Aspirin 81 Mg  Tabs (Aspirin) .... One By Mouth Once Daily 5)  Cpap 10 Cm H20 .... At Bedtime 6)  Oxygen 2 To 3 Liters Pulsed .... As Needed 7)  Lisinopril 20 Mg Tabs (Lisinopril) .... Take One By Mouth Once Daily 8)  Zemplar 2 Mcg Caps (Paricalcitol) .... Take One By Mouth Every Other Day 9)  Furosemide 20 Mg Tabs (Furosemide) .... Take 2 By Mouth Every Morning and 1 By Mouth Every Evening 10)  Apidra Solostar 100 Unit/ml Soln (Insulin Glulisine) .... Inject 15 Subq Three Times A Day With Meals 11)  Lantus 100 Unit/ml Soln (Insulin Glargine) .... Inject 10 Units Subq Two Times A Day 12)  Protonix 40 Mg Tbec (Pantoprazole Sodium) .... Take One By Mouth Once Daily 13)  Gas-X 80 Mg Chew (Simethicone) .... 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) CHRONIC RESPIRATORY FAILURE (ICD-518.83) MORBID OBESITY (ICD-278.01) ABDOMINAL PAIN, EPIGASTRIC (ICD-789.06) ERECTILE DYSFUNCTION (ICD-302.72) THROMBOCYTOPENIA (ICD-287.5) ARTHRITIS (ICD-716.90) DEPRESSION (ICD-311) COPD (ICD-496)   - PFTs 10/13/07 FEV1 of 45%, ratio 63%, FVC  15% improvement after bronchodilators DLCO 45% SEIZURE DISORDER (ICD-780.39) HYPERTENSION (ICD-401.9)  HYPERLIPIDEMIA (ICD-272.4) DIABETES MELLITUS, TYPE II (ICD-250.00) ALLERGIC RHINITIS (ICD-477.9) TOBACCO USER (ICD-305.1) SLEEP APNEA (ICD-780.57)  Social History: Packs/Day:  1.0 Smoking Status:  current-approx 60 years  Vital Signs:  Patient profile:    70 year old male Height:      68 inches Weight:      252.13 pounds BMI:     38.47 O2 Sat:      92 % on 3 L/min Temp:     98.2 degrees F oral Pulse rate:   100 / minute BP sitting:   132 / 78  (right arm) Cuff size:   regular  Vitals Entered By: Reynaldo Minium CMA (May 13, 2010 1:46 PM)  O2 Flow:  3 L/min CC: Follow up visit-COPD-states wheezing with sleep and weakness. Recent hospital stay for COPD/emphysema   Physical Exam  Additional Exam:  On physical exam he is a chronically ill-appearing male wt 240 > 252 May 13, 2010  skin: anicteric HEENT: normocephalic; PEERLA; no nasal or pharyngeal abnormalities neck: supple nodes: no cervical lymphadenopathy chest: breath sounds are decreased throughout with prolonged exp phase, early insp hoover pos heart: no murmurs, gallops, or rubs abd: soft, nontender; BS normoactive; no abdominal masses, tenderness, organomegaly rectal: deferred ext: no cynanosis, clubbing, edema skeletal: no deformities neuro: oriented x 3; no focal abnormalities    CXR  Procedure date:  04/09/2010  Findings:        Comparison: 06/21/2009    Findings: 2 frontal views. Midline trachea.  Normal heart size and   mediastinal contours. No pleural effusion or pneumothorax.  Diffuse   peribronchial thickening.  Minimal residual thickening of the right   minor fissure. Clear lungs.    IMPRESSION:   1.  No acute cardiopulmonary disease.   2.  Mild peribronchial thickening which may relate to chronic   bronchitis or smoking.  Impression & Recommendations:  Problem # 1:  COPD (ICD-496)  GOLD III and still smoking    DDX of  difficult airways managment all start with A and  include Adherence, Ace Inhibitors, Acid Reflux, Active Sinus Disease, Alpha 1 Antitripsin deficiency, Anxiety masquerading as Airways dz,  ABPA,  allergy(esp in young), Aspiration (esp in elderly), Adverse effects of DPI,  Active smokers, plus two Bs  = Bronchiectasis and Beta  blocker use..and one C= CHF    Adherence remains a challenge  Each maintenance medication was reviewed in detail including most importantly the difference between maintenance and as needed and under what circumstances the prns are to be used. consider adding spiriva next. I think of spiriva in this setting like purchasing high octane fuel for an older car with lots of miles on it. It may help the perfomance enough to warrant the purchase, but it won't change the longevity of the car or make it any easier parking it. It should improve peak performance if the patient is patient and lets the medicine work the way it's intended  - improving activity tolerance where limits on the mechanical ventilatory ceiling causing dynamic hyperinflation is the problem.   ? acid reflux:  try ppi  ? adverse effect of advair, doubt it  ? Ace inhib issue, doubt this also.    However,  these three together are sometimes like matches gasoline and 02 in terms of destabilizing copd, all it takes is a spark... low threshold to change advair to symbicort and ace to arb while treating gerd if freq exac or worseniing copd     Problem # 2:  TOBACCO USER (ICD-305.1)  I emphasized that although we never turn away smokers from the pulmonary clinic, we do ask that they understand that the recommendations that were made won't work nearly as well in the presence of continued cigarette exposure and we may reach a point where we can't help the patient if he/she can't quit smoking.    I took this opportunity to educate the patient regarding the consequences of smoking in airway disorders based on all the data we have from the multiple national lung health studies indicating that smoking cessation, not choice of inhalers or physicians, is the most important aspect of care.    Medications Added to Medication List This Visit: 1)  Advair Diskus 250-50 Mcg/dose Misc (Fluticasone-salmeterol) .... One puff two times a day 2)  Furosemide 20  Mg Tabs (Furosemide) .... Take 2 by mouth every morning and 1 by mouth every evening 3)  Protonix 40 Mg Tbec (Pantoprazole sodium) .... Take  one 30-60 min before first meal of the day  Other Orders: Est. Patient Level IV (04540) Tobacco use cessation intermediate 3-10 minutes (98119)  Patient Instructions: 1)  I will write Dr [name] re: my recommendations 2)  You need to stop smoking before it stops you 3)  Protonix 40 mg  Take  one 30-60 min before first meal of the day (this the same drug as prilosec otc)  4)  GERD (REFLUX)  is a common cause of respiratory symptoms. It commonly presents without heartburn and can be treated with medication, but also with lifestyle changes including avoidance of late meals, excessive alcohol, smoking cessation, and avoid fatty foods, chocolate, peppermint, colas, red wine, and acidic juices such as orange juice. NO MINT OR MENTHOL PRODUCTS SO NO COUGH DROPS  5)  USE SUGARLESS CANDY INSTEAD (jolley ranchers)  6)  NO OIL BASED VITAMINS  Prescriptions: ADVAIR DISKUS 250-50 MCG/DOSE  MISC (FLUTICASONE-SALMETEROL) one puff two times a day  #1 x 11   Entered and Authorized by:   Nyoka Cowden MD   Signed by:   Nyoka Cowden MD on 05/13/2010   Method used:   Electronically to        [facility] [address] [phone]* (retail)       [address]       [place], [place]  [address]       Ph: [phone] or [phone]       Fax: [phone]   RxID:   [id number] PROTONIX 40 MG TBEC (PANTOPRAZOLE SODIUM) Take  one 30-60 min before first meal of the day  #34 x 2   Entered and Authorized by:   Nyoka Cowden MD   Signed by:   Nyoka Cowden MD on 05/13/2010   Method used:   Print then Give to Patient   RxID:   [id number]

## 2010-05-19 NOTE — Telephone Encounter (Signed)
Spoke with pt and made an appt for him to see Dr. Arlyce Dice 05/22/10 @3 :15pm. Pt aware of appt date and time. Pt to have copay waived for this visit.

## 2010-05-22 ENCOUNTER — Other Ambulatory Visit (INDEPENDENT_AMBULATORY_CARE_PROVIDER_SITE_OTHER): Payer: Medicare PPO

## 2010-05-22 ENCOUNTER — Encounter: Payer: Self-pay | Admitting: Gastroenterology

## 2010-05-22 ENCOUNTER — Ambulatory Visit (INDEPENDENT_AMBULATORY_CARE_PROVIDER_SITE_OTHER): Payer: Medicare PPO | Admitting: Gastroenterology

## 2010-05-22 VITALS — BP 132/60 | HR 92 | Ht 68.0 in | Wt 250.0 lb

## 2010-05-22 DIAGNOSIS — K859 Acute pancreatitis without necrosis or infection, unspecified: Secondary | ICD-10-CM

## 2010-05-22 LAB — COMPREHENSIVE METABOLIC PANEL
ALT: 29 U/L (ref 0–53)
Albumin: 3.7 g/dL (ref 3.5–5.2)
CO2: 29 mEq/L (ref 19–32)
Chloride: 102 mEq/L (ref 96–112)
GFR: 37.43 mL/min — ABNORMAL LOW (ref >60.00)
Glucose, Bld: 53 mg/dL — ABNORMAL LOW (ref 70–99)
Potassium: 4.7 mEq/L (ref 3.5–5.1)
Sodium: 140 mEq/L (ref 135–145)
Total Protein: 7.4 g/dL (ref 6.0–8.3)

## 2010-05-22 NOTE — Assessment & Plan Note (Addendum)
Patient has had recurrent acute  pancreatitis of unclear etiology.   He is on no obvious medications associated with pancreatitis. Ischemic pancreatitis and occult biliary tract disease are possibilities. Pancreatic malignancy is less likely since he was  symptom-free for almost 9 months until this recurrence.  Recommendations  #1 MRCP

## 2010-05-22 NOTE — Patient Instructions (Addendum)
Copy sent to Evelene Croon Your Open MRI (MRCP) is scheduled on 05/28/2010 at 12:45pm    Acute Pancreatitis The pancreas is a large gland located behind your stomach. It produces (secretes) enzymes. These enzymes help digest food. It also releases the hormones glucagon and insulin. These hormones help regulate blood sugar. When the pancreas becomes inflamed, the disease is called pancreatitis. Inflammation of the pancreas occurs when enzymes from the pancreas begin attacking and digesting the pancreas. CAUSES Most cases of sudden onset (acute) pancreatitis are caused by:  Alcohol abuse.   Gallstones.  Other less common causes are:  Some medications.   Exposure to certain chemicals   Infection.   Damage caused by an accident (trauma).   Surgery of the belly (abdomen).  SYMPTOMS Acute pancreatitis usually begins with pain in the upper abdomen and may radiate to the back. This pain may last a couple days. The constant pain varies from mild to severe. The acute form of this disease may vary from mild, nonspecific abdominal pain to profound shock with coma. About 1 in 5 cases are severe. These patients become dehydrated and develop low blood pressure. In severe cases, bleeding into the pancreas can lead to shock and death. The lungs, heart, and kidneys may fail. DIAGNOSIS Your caregiver will form a clinical opinion after giving you an exam. Laboratory work is used to confirm this diagnosis. Often, a digestive enzyme from the pancreas (serum amylase) and other enzymes are elevated. Sugars and fats (lipids) in the blood may be elevated. There may also be changes in the following levels: calcium, magnesium, potassium, chloride and bicarbonate (chemicals in the blood). X-rays, a CT scan, or ultrasound of your abdomen may be necessary to search for other causes of your abdominal pain. TREATMENT Most pancreatitis requires treatment of symptoms. Most acute attacks last a couple of days. Your  caregiver can discuss the treatment options with you.  If complications occur, hospitalization may be necessary for pain control and intravenous (IV) fluid replacement.   Sometimes, a tube may be put into the stomach to control vomiting.   Food may not be allowed for 3 to 4 days. This gives the pancreas time to rest. Giving the pancreas a rest means there is no stimulation that would produce more enzymes and cause more damage.   Medicines (antibiotics) that kill germs may be given if infection is the cause.   Sometimes, surgery may be required.   Following an acute attack, your caregiver will determine the cause, if possible, and offer suggestions to prevent recurrences.  HOME CARE INSTRUCTIONS  Eat smaller, more frequent meals. This reduces the amount of digestive juices the pancreas produces.   Decrease the amount of fat in your diet. This may help reduce loose, diarrheal stools.   Drink enough water and fluids to keep your urine clear or pale yellow. This is to avoid dehydration which can cause increased pain.   Talk to your caregiver about pain relievers or other medicines that may help.   Avoid anything that may have triggered your pancreatitis (for example, alcohol).   Follow the diet advised by your caregiver. Do not advance the diet too soon.   Take medicines as prescribed.   Get plenty of rest.   Check your blood sugar at home as directed by your caregiver.   If your caregiver has given you a follow-up appointment, it is very important to keep that appointment. Not keeping the appointment could result in a lasting (chronic) or permanent injury, pain,  and disability. If there is any problem keeping the appointment, you must call to reschedule.  SEEK MEDICAL CARE IF:  You are not recovering in the time described by your caregiver.   You have persistent pain, weakness, or feel sick to your stomach (nauseous).   You have recovered and then have another bout of pain.    SEEK IMMEDIATE MEDICAL CARE IF:  You are unable to eat or keep fluids down.   Your pain increases a lot or changes.   Your skin or the white part of your eyes look yellow (jaundice).   You develop vomiting.   You feel dizzy or faint.   Your blood sugar is high (over 300).  MAKE SURE YOU:  Understand these instructions.   Will watch your condition.   Will get help right away if you are not doing well or get worse.  Document Released: 02/15/2005 Document Re-Released: 08/05/2009 [facility] Patient Information 2011 [place], [place].

## 2010-05-22 NOTE — Progress Notes (Signed)
History of Present Illness:   [name] [name] has returned for reevaluation of abdominal pain. He was seen in June, 2011 for acute pancreatitis. Etiology was never determined. He develop recurrent abdominal pain, and fever, in February 2012. CT scan demonstrated inflammatory changes in the head of the pancreas consistent with acute pancreatitis. Since that time his pain has subsided. He complains of mild anorexia.    Review of Systems:  He has moderate dyspnea on exertion.   Pertinent positive and negative review of systems were noted in the above HPI section. All other review of systems were otherwise negative.    Current Medications, Allergies, Past Medical History, Past Surgical History, Family History and Social History were reviewed in [employer] electronic medical record  Vital signs were reviewed in today's medical record. Physical Exam: General: Well developed , well nourished, no acute distress Head: Normocephalic and atraumatic Eyes:  sclerae anicteric, EOMI Ears: Normal auditory acuity Mouth: No deformity or lesions Lungs: Clear throughout to auscultation Heart: Regular rate and rhythm; no murmurs, rubs or bruits Abdomen: Soft,non distended. No masses, hepatosplenomegaly or hernias noted. Normal Bowel sounds;  There is mild tenderness in the right para the local area to palpation Rectal:deferred Musculoskeletal: Symmetrical with no gross deformities  Pulses:  Normal pulses noted Extremities: No clubbing, cyanosis, edema or deformities noted Neurological: Alert oriented x 4, grossly nonfocal Psychological:  Alert and cooperative. Normal mood and affect

## 2010-05-24 LAB — PROTIME-INR: Prothrombin Time: 12.5 seconds (ref 11.6–15.2)

## 2010-05-24 LAB — CBC
Platelets: 75 10*3/uL — ABNORMAL LOW (ref 150–400)
RDW: 14.5 % (ref 11.5–15.5)
WBC: 9.9 10*3/uL (ref 4.0–10.5)

## 2010-05-24 LAB — GLUCOSE, CAPILLARY: Glucose-Capillary: 151 mg/dL — ABNORMAL HIGH (ref 70–99)

## 2010-05-24 LAB — CHROMOSOME ANALYSIS, BONE MARROW

## 2010-05-24 LAB — APTT: aPTT: 32 seconds (ref 24–37)

## 2010-05-27 ENCOUNTER — Other Ambulatory Visit: Payer: Self-pay | Admitting: Gastroenterology

## 2010-05-27 DIAGNOSIS — K859 Acute pancreatitis without necrosis or infection, unspecified: Secondary | ICD-10-CM

## 2010-05-28 ENCOUNTER — Ambulatory Visit
Admission: RE | Admit: 2010-05-28 | Discharge: 2010-05-28 | Disposition: A | Discharge status: Home / Self Care | Payer: Medicare PPO | Source: Ambulatory Visit | Attending: Gastroenterology | Admitting: Gastroenterology

## 2010-05-28 DIAGNOSIS — K859 Acute pancreatitis without necrosis or infection, unspecified: Secondary | ICD-10-CM

## 2010-05-28 NOTE — Progress Notes (Signed)
 ----   Converted from flag ---- ---- 05/18/2010 4:33 PM, Louis Meckel MD wrote: Pt needs app't.  Try to place him in where there is a cancellation. ------------------------------       Additional Follow-up for Phone Call Additional follow up Details #2::    Spoke with patient and he states he does not have the money for the copay. Will check with the front office tomorrow and call patient. Follow-up by: Selinda Michaels RN,  May 18, 2010 5:03 PM  Additional Follow-up for Phone Call Additional follow up Details #3:: Details for Additional Follow-up Action Taken: Patient scheduled for OV per Dr. Arlyce Dice. Patient scheduled for appointment 05/22/10@3 :15pm. Patient aware of appointment date and time. Additional Follow-up by: Selinda Michaels RN,  May 19, 2010 3:25 PM

## 2010-06-01 ENCOUNTER — Telehealth: Payer: Self-pay | Admitting: *Deleted

## 2010-06-01 DIAGNOSIS — K859 Acute pancreatitis without necrosis or infection, unspecified: Secondary | ICD-10-CM

## 2010-06-01 NOTE — Telephone Encounter (Signed)
Spoke with pt, he is to come in and have labs drawn on Wed  Per Dr Arlyce Dice

## 2010-06-03 ENCOUNTER — Other Ambulatory Visit: Payer: Medicare PPO

## 2010-06-03 DIAGNOSIS — K859 Acute pancreatitis without necrosis or infection, unspecified: Secondary | ICD-10-CM

## 2010-06-17 ENCOUNTER — Encounter: Payer: Self-pay | Admitting: Family Medicine

## 2010-06-22 ENCOUNTER — Telehealth: Payer: Self-pay | Admitting: Gastroenterology

## 2010-06-26 NOTE — Telephone Encounter (Signed)
Pt called requesting his lab results. Pt informed lab results normal.

## 2010-07-03 ENCOUNTER — Ambulatory Visit: Payer: Medicare PPO | Admitting: Gastroenterology

## 2010-07-13 ENCOUNTER — Encounter: Payer: Medicare PPO | Admitting: Cardiology

## 2010-07-13 ENCOUNTER — Ambulatory Visit: Payer: Medicare PPO | Admitting: Family Medicine

## 2010-07-13 ENCOUNTER — Other Ambulatory Visit: Payer: Self-pay | Admitting: Cardiology

## 2010-07-13 DIAGNOSIS — I70219 Atherosclerosis of native arteries of extremities with intermittent claudication, unspecified extremity: Secondary | ICD-10-CM

## 2010-07-14 ENCOUNTER — Telehealth: Payer: Self-pay | Admitting: Family Medicine

## 2010-07-14 ENCOUNTER — Ambulatory Visit: Payer: Medicare PPO | Admitting: Family Medicine

## 2010-07-14 NOTE — Assessment & Plan Note (Signed)
[facility]                         GASTROENTEROLOGY OFFICE NOTE   PAIDEN, CAVELL                       MRN:          [id number]  DATE:01/13/2007                            DOB:          Jun 26, 1940    PROBLEM:  Right-sided abdominal pain x1 month.   HISTORY:  Mr. Tener is a 70 year old African-American male known to Dr.  Arlyce Dice.  He has a history of adult onset diabetes mellitus, insulin  dependent, COPD oxygen dependent, history of colon polyps, sleep apnea,  and hypertension.  The patient has had a new right-sided abdominal pain  over the past 1 month.  He did have two ER visits in the past month for this pain.  He describes  the pain as being constant and non radiating. It is not burning, just a  deep pain. He has felt that it has been worse postprandially,  immediately after eating.  He has not had any vomiting but has had some  occasional nausea and his appetite has been significantly decreased.  He  feels that he has lost about 20 pounds over the past month or so since  onset of his pain.  His bowel movements have been a bit more  constipated.  He is using milk of magnesia p.r.n.  He has not noted any  melena or hematochezia.  Stool Hemoccult in the ER on October 28 was  negative.  He is not on any regular NSAIDs, does take one baby aspirin  per day.  He was placed on a trial of Zegerid per Dr. Artist Pais which he has  been taking over the past couple weeks and says that he is not sure that  this is helping but overall his pain is actually better over the past  week.   LABORATORY DATA:  On January 05, 2007:  WBC of 8.8 thousand, hemoglobin  18.7, hematocrit of 57, MCV of 90.  Electrolytes within normal limits,  BUN 21, creatinine 1.84.  Liver function studies normal.  Stool for  occult blood was negative. Urinalysis was negative.  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  Abdominal ultrasound done on  October 10, 2006 showed a normal gallbladder, non dilated common bile  duct, some coarsening of the exo texture of the liver consistent with a  degree of fatty liver.   CURRENT MEDICATIONS:  1. Advair 250/50 one puff b.i.d.  2. Lantus 15 units b.i.d.  3. Glimepiride 2 mg b.i.d. a.c.  4. Benicar HCT 40/25 daily.  5. Klor-Con 20 mEq daily.  6. Furosemide 40 daily.  7. Bayer aspirin 81 mg daily.  8. Simvastatin 40 q.h.s.  9. Fluticasone 2 sprays to each nostril daily.   ALLERGIES:  No known drug allergies.   PHYSICAL EXAMINATION:  GENERAL:  Well-developed African-American male in  no acute distress, wearing portable oxygen tank.  VITAL SIGNS:  Weight is 256.4, blood pressure 130/68, pulse is 88.  CARDIOVASCULAR:  Regular rate and rhythm with S1, S2.  PULMONARY:  With  decreased breath sounds bilaterally, no wheezes.  ABDOMEN:  Protuberant, soft, bowel sounds are active, he is mildly  tender in the right mid quadrant, there is no palpable mass or  hepatosplenomegaly.  No guarding, no rebound.  RECTAL EXAM:  Was not done as he was Hemoccult negative on December 27, 2006.   IMPRESSION:  70 year-old African-American male with 1 month  history of right mid quadrant abdominal pain with postprandial  exacerbation.  Somewhat improved with Zegerid.  Rule out peptic ulcer  disease or duodenitis, rule out possible biliary dyskinesia with normal  ultrasound August, 2008.   PLAN:  1. Continue Zegerid one p.o. daily. Patient was given samples and a      prescription today.  2. Schedule upper endoscopy.  3. If esophagogastroduodenoscopy is negative consider CCK HIDA scan      and/or gastric emptying scan as he is diabetic.  4. Patient asked for pain medication, he was given a prescription for      Vicodin 5/500 one q.4-6 hours p.r.n. pain.  He then declined the      prescription stating that does not work for him.      Mike Gip,  PA-C  Electronically Signed      Rachael Fee, MD  Electronically Signed   AE/MedQ  DD: 01/13/2007  DT: 01/14/2007  Job #: [id number]   cc:   Barbette Hair. Arlyce Dice, MD,FACG

## 2010-07-14 NOTE — Assessment & Plan Note (Signed)
[facility]                         GASTROENTEROLOGY OFFICE NOTE   LASTER, APPLING                       MRN:          [id number]  DATE:05/10/2007                            DOB:          Oct 13, 1940    PROBLEM:  Abdominal pain.   [name] has returned, again complaining of abdominal pain.  He claims  that he has pain across his upper abdomen that is clearly worsened  postprandially.  He was seen for a similar problem in November 2008.  Though endoscopy was recommended, he declined to follow up with this.  Pain subsided after approximately twelve days.  He claims the Zegerid  did not improve his symptoms.  He has now had two weeks of upper  abdominal pain, as described above.  It is aching pain.  There is no  nausea or vomiting.   MEDICATIONS:  Unchanged and include Advair, insulin, glimepiride,  Benicar, potassium, Lasix, simvastatin, and fluticasone spray.   He has no allergies.   PHYSICAL EXAM:  Pulse 104, blood pressure 102/58, weight 261.  HEENT: EOMI.  PERRLA.  Sclerae are anicteric.  Conjunctivae are pink.  NECK:  Supple without thyromegaly, adenopathy or carotid bruits.  CHEST:  He has decreased breath sounds throughout.  CARDIAC:  Regular rhythm; normal S1 S2.  There are no murmurs, gallops  or rubs.  ABDOMEN:  Obese and protuberant.  There are no abdominal masses,  tenderness, or organomegaly.  EXTREMITIES:  Full range of motion.  No cyanosis, clubbing or edema.  RECTAL:  Deferred.   IMPRESSION:  Recurrent postprandial upper abdominal pain.  This could be  due to ulcerative or nonulcerative dyspepsia.  Gastroparesis is also a  consideration.  Chronic cholecystitis is less likely.   RECOMMENDATION:  Upper endoscopy.  If this is negative, I will try  anticholinergics.  Failing this, I will proceed with gastric emptying  scan.     Barbette Hair. Arlyce Dice, MD,FACG  Electronically Signed    RDK/MedQ  DD: 05/10/2007  DT: 05/10/2007   Job #: [id number]

## 2010-07-14 NOTE — Assessment & Plan Note (Signed)
[name]                         GASTROENTEROLOGY OFFICE NOTE   [name] [name], [name] [name]                       MRN:          [id number]  DATE:06/12/2007                            DOB:          1941/01/10    PROBLEM:  Abdominal pain.   [name] [name] has returned for scheduled followup.  Upper endoscopy  demonstrated a nonspecific duodenitis and gastritis.  Biopsies showed  mild chronic gastritis.  He was placed on Pepcid but did not take the  medication.  A gastric emptying scan did show significant gastric delay.  Reglan was started.  [name] [name] continues to complain of intermittent  abdominal pain postprandially.  He took an ibuprofen with relief.   PHYSICAL EXAMINATION:  VITAL SIGNS:  Pulse 104, blood pressure 124/56,  weight 252.  GENERAL:  He is a well-developed, well-nourished male with supplementary  oxygen.   IMPRESSION:  1. Nonspecific abdominal pain.  I am doubtful that this is due to his      gastritis.  2. Gastroparesis.   RECOMMENDATIONS:  To his other medicines, I would add NuLev 0.25 mg  sublingual.  He was encouraged to take his Pepcid.     Barbette Hair. Arlyce Dice, MD,FACG  Electronically Signed    RDK/MedQ  DD: 06/12/2007  DT: 06/12/2007  Job #: [id number]

## 2010-07-14 NOTE — Assessment & Plan Note (Signed)
 [facility]                         GASTROENTEROLOGY OFFICE NOTE   [name], [name]                       MRN:          [id number]  DATE:07/11/2006                            DOB:          [birth year]    REASON FOR CONSULTATION:  History of colon polyps.   [name] [name] is a pleasant 70 year old African American male referred  through the courtesy of [name] [name] for evaluation.  Multiple adenomatous  colon polyps were removed about five years ago.  [name] [name] has no GI  complaints, including change in bowel habits, abdominal pain, melena, or  hematochezia.   PAST MEDICAL HISTORY:  1. Diabetes.  2. Hypertension.  3. Arthritis.  4. Sleep apnea.  5. Depression.   FAMILY HISTORY:  Pertinent for heart disease in both parents and  diabetes in two sisters.   MEDICATIONS:  __________ , baby aspirin, Amaryl, Benicar, and insulin.   ALLERGIES:  HE HAS NO ALLERGIES.   SOCIAL HISTORY:  He smokes a half a pack a day.  He does not drink.  He  is single.   REVIEW OF SYSTEMS:  Positive for night sweats and some shortness of  breath.   PHYSICAL EXAMINATION:  VITAL SIGNS:  Pulse 96, blood pressure 150/78,  weight 269.  HEENT:  EOMI.  PERRLA.  Sclerae are anicteric.  Conjunctivae are pink.  NECK:  Supple without thyromegaly, adenopathy, or carotid bruits.  CHEST:  Clear to auscultation and percussion without adventitious  sounds.  CARDIAC:  Regular rhythm; normal S1 and S2.  There are no murmurs,  gallops, or rubs.  ABDOMEN:  Bowel sounds are normoactive.  Abdomen is soft, nontender, and  nondistended.  There are no abdominal masses, tenderness, splenic  enlargement, or hepatomegaly.  EXTREMITIES:  Full range of motion.  No cyanosis, clubbing, or edema.  RECTAL:  Deferred.   IMPRESSION:  1. History of colon polyps.  2. Diabetes.  3. Hypertension.   RECOMMENDATIONS:  Followup colonoscopy.     [name] [name]. [name] Dice, MD,FACG  Electronically Signed    RDK/MedQ  DD: 07/11/2006  DT: 07/11/2006  Job #: [id number]

## 2010-07-15 ENCOUNTER — Other Ambulatory Visit: Payer: Self-pay

## 2010-07-15 NOTE — Telephone Encounter (Signed)
Pt called and want rx sent to [facility] for vials of apidra 20 units tid ok by [employer]

## 2010-07-17 ENCOUNTER — Encounter: Payer: Self-pay | Admitting: Gastroenterology

## 2010-07-17 NOTE — Consult Note (Signed)
NAME:  [name] [name], [name] [name]                ACCOUNT NO.:  [id number]   MEDICAL RECORD NO.:  [id number]          PATIENT TYPE:  INP   LOCATION:  0158                         FACILITY:  [facility]   PHYSICIAN:  Charlaine Dalton. Sherene Sires, MD, FCCPDATE OF BIRTH:  Nov 26, 1940   DATE OF CONSULTATION:  10/20/2005  DATE OF DISCHARGE:                                   CONSULTATION   PULMONARY CONSULTATION   DATE OF CONSULTATION:  October 20, 2005   REFERRING PHYSICIAN:  Windy Fast A. Darrelyn Hillock, M.D.   REASON FOR CONSULTATION:  Acute respiratory failure postoperatively.   HISTORY OF PRESENT ILLNESS:  This is a 70 year old black male active smoker  with clinical diagnosis of chronic obstructive pulmonary disease, maintained  on Advair prior to admission and still smoking despite Dr. Olegario Messier best  efforts to help the patient stop smoking.  He was admitted for elective  lumbar laminectomy and underwent the procedure today but immediately after  extubation per notes, had increased work of breathing and desaturation and  required re-intubation.  Hemodynamically he remained stable and is now  transferred to the intensive care unit for continued ventilation and  pulmonary evaluation as to why this patient remains dependent.  There has  been no apparent exacerbation of chronic obstructive pulmonary disease in  terms of any subjective wheezing, increased dyspnea, cough, chest pain,  fevers up to 100, PND, leg swelling preoperatively.  He also underwent  cardiac evaluation for clearance recently with no evidence of heart disease.   In the ICU he is hemodynamically stable, remains sedated, on the ventilator,  with relatively high airway pressures and minimal secretions and no reported  fever, purulent secretions or evidence of aspiration perioperatively.   PAST MEDICAL HISTORY:  Significant for obesity complicated by hypertension  and diabetes.   ALLERGIES:  None known.   MEDICATIONS:  Hyzaar, Caduet, aspirin, Actos,  hydrocodone, multivitamins,  Lantus, Advair Diskus.   SOCIAL HISTORY:  He is an active smoker as noted above, actively smoking 2  packs per day.  He is divorced and on disability for back pain.  He lives by  himself in a one story house.   FAMILY HISTORY:  Positive for MI in mother in her 25's.  Father deceased at  5 from MI.   REVIEW OF SYSTEMS:  Taken in as much detail as possible from the chart.  Essentially negative except as outlined above.   PHYSICAL EXAMINATION:  GENERAL APPEARANCE:  This is an obese black male on a  ventilator who is sedated and communicates by nodding appropriately.  VITAL SIGNS:  He is afebrile with normal vital signs.  HEENT:  Oropharynx is clear.  Dentition intact.  There is an endotracheal  tube in place.  NECK:  Supple without cervical adenopathy or tenderness.  Trachea is  midline.  No thyromegaly.  LUNG:  Fields reveal very distant breath sounds with faint pan-expiratory  wheeze.  HEART:  Has a regular rhythm without murmurs, rubs or gallops.  CHEST:  Hyperresonant to percussion.  ABDOMEN:  Obese, flat, benign with no palpable organomegaly, masses or  tenderness. Bowel sounds are  diminished.  EXTREMITIES:  Warm without calf tenderness, cyanosis or clubbing.  There are  mild chronic venous stasis changes bilaterally.  PAS hose were in place.  NEUROLOGICAL:  No focal deficits, pathologic reflexes.  SKIN:  Warm and dry.   LABORATORY DATA:  Included a CBC showing hematocrit of 55, bicarb level  prior to admission of 30, creatinine of 1.6.  Normal coag's.  Normal  urinalysis.   Chest x-ray is pending but preoperatively showed moderate chronic  obstructive pulmonary disease.   IMPRESSION:  1. Ventilator-dependent acute respiratory failure in this patient who has      underlying chronic obstructive pulmonary disease and may have a      component of obesity hypoventilation syndrome as well as chronic      hypoxemia suggested by polycythemia.  For  now I would rest him on the      ventilator and treat him with bronchodilators overnight in the form of      albuterol and Atrovent.  2. Obesity complicated by hypertension.  For now we can treat him with      Aldomet 500 mg IV q.4h. p.r.n. until he is able to resume p.o.'s.  3. Obesity, complicated by diabetes, sliding scale insulin was ordered.  4. Chronic renal insufficiency, probably as a combination of diabetes and      hypertension with a baseline creatinine of around 1.6; therefore, we      need to keep him adequately hydrated perioperatively.  5. 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      This raises the issue that this patient may have more significant      chronic respiratory failure than previously recognized and will be a      challenge to mobilize postoperatively in the setting of back pain and      the requirement for narcotics as well as his underlying obesity.  We      will do the best we can to help in this regard.  It would help to      minimize sedation if possible and early physical therapy intervention,      if at all possible.           ______________________________  Charlaine Dalton. Sherene Sires, MD, [facility]     MBW/MEDQ  D:  10/20/2005  T:  10/21/2005  Job:  [id number]   cc:   Barbette Hair. Hudson, DO  [address] [place], [place] [address]

## 2010-07-17 NOTE — Assessment & Plan Note (Signed)
[facility]                               PULMONARY OFFICE NOTE   [name], [name]                       MRN:          [id number]  DATE:12/16/2005                            DOB:          April 05, 1940    PULMONARY FINAL FOLLOWUP OFFICE VISIT   This is a 70 year old, white male who actually missed his last followup  visit and has documented COPD with FEV1 of only 45% predicted with a 19%  improvement after bronchodilators who continues to smoke and also has  trouble with morbid obesity and daytime and nocturnal desaturation with  sleep apnea as well.  Despite all of these issues and a long discussion of  specific recommendations on October 29, 2005, he missed his followup visit  and has failed to follow any of the advice I gave him.  He admits he is not  wearing the oxygen, and not wearing CPAP, although I do believe he is using  Advair consistently at 250/50 b.i.d.  That is the only recommendation I made  and he has also resumed cigarettes.   PHYSICAL EXAMINATION:  GENERAL:  He is an obese, black male in no acute  distress.  VITAL SIGNS:  He is afebrile, stable vital signs.  HEENT:  Unremarkable.  Oropharynx clear.  CHEST:  Lung fields revealed diminished breath sounds without wheezing.  HEART:  There is a regular rate and rhythm without murmur, gallop, or rub.  ABDOMEN:  Obese, soft, benign.  EXTREMITIES:  Warm without calf tenderness, cyanosis or clubbing.   IMPRESSION:  1. Chronic hypoxemic and hypercarbic respiratory failure in a patient who      is actively smoking with making no headway at all in terms of weight      control with now documented non-adherence.  There is really nothing we      can do for this patient here in the Pulmonary Clinic if he cannot take      the recommendations more seriously.  He has approached a fork in the      road,  where he even needs to start taking better care of himself or      begin to discuss  end-of-life issues with his family, and I told him      this in a polite but point blank fashion today.  2. I would be happy to see the patient back here in the Pulmonary Clinic      if he follows the instructions that he has already been given and is      having specific pulmonary problems that Dr. Artist Pais would like [place] to      address.            ______________________________  Charlaine Dalton Sherene Sires, MD, [facility]   MBW/MedQ DD:  12/16/2005 DT:  12/19/2005 Job #:  [id number]   cc:   Barbette Hair. Artist Pais, DO

## 2010-07-17 NOTE — Procedures (Signed)
NAME:  [name] [name], [name] [name] NO.:  [id number]   MEDICAL RECORD NO.:  [id number]          PATIENT TYPE:  OUT   LOCATION:  SLEEP CENTER                 FACILITY:  [facility]   PHYSICIAN:  Barbaraann Share, MD,FCCPDATE OF BIRTH:  [birth year]1938   DATE OF STUDY:  12/06/2005                              NOCTURNAL POLYSOMNOGRAM    REFERRING PHYSICIAN:  Dr. Sandrea Hughs   INDICATION FOR STUDY:  Hypersomnia with sleep apnea.   EPWORTH SLEEPINESS SCORE:  16   SLEEP ARCHITECTURE:  Patient had total sleep time of 304 minutes with  decreased REM and never achieved slow-wave sleep.  Sleep onset latency was  normal, and REM onset was very prolonged at 244 minutes.  Sleep efficiency  is decreased at 69%.   RESPIRATORY DATA:  The patient underwent split night protocol, where she was  found to have 243 obstructive events in the first 128 minutes of sleep.  This gave her a respiratory disturbance index of 114 events per hour in the  first half of the night.  There was moderate snoring noted, and the events  were not positional.  By protocol, the patient was then placed on a CPAP  mask, whose size and type was not specified by the sleep technician.  Pressure was then increased sequentially to a final pressure of 13 cm of  water; however, the patient continued to have persistent breakthrough that  was clinically significant.   OXYGEN DATA:  The patient initially had O2 added at 1 liter and was  ultimately increased to 3 L for O2 desaturation less than 88%.  However, it  should be noted the patient continued to have obstructive sleep apnea and  that most of the desaturations were associated with breakthrough obstructive  events.   CARDIAC DATA:  The patient had occasional PVC noted.   MOVEMENT-PARASOMNIA:  No clinically significant events.   IMPRESSIONS-RECOMMENDATIONS:  1. Split night study reveals very severe obstructive sleep apnea, with a      respiratory disturbance index of 114  events per hour, and O2      desaturation is low as 68%.  Patient was then placed on CPAP and      ultimately titrated to a pressure of 13 cm, however, there was only      moderate control of pain at best.  Patient will obviously need higher      CPAP pressures in order to control her obstructive sleep apnea.  I      would recommend at this point in time initiating CPAP at 10 cm and      having her return to the sleep center for formal CPAP titration in      approximately 4-6 weeks, in light of the severity of her obstructive      sleep apnea.  2. Severe O2 desaturation noted throughout the study that was treated      during the night with supplemental nasal oxygen.  However, the patient      continued to have obstructive events which correlated with the      patient's      continued O2 desaturation.  The decision regarding  supplemental O2, in      addition to the patient's CPAP, can be determined at a formal CPAP      titration.           ______________________________  Barbaraann Share, MD,FCCP  Diplomate, American Board of Sleep  Medicine     [name]/MEDQ  D:  12/23/2005 17:50:25  T:  12/24/2005 22:36:18  Job:  [id number]

## 2010-07-17 NOTE — Discharge Summary (Signed)
NAME:  [name] [name], [name] [name]                ACCOUNT NO.:  [id number]   MEDICAL RECORD NO.:  [id number]          PATIENT TYPE:  INP   LOCATION:  1512                         FACILITY:  [facility]   PHYSICIAN:  Georges Lynch. Gioffre, M.D.DATE OF BIRTH:  [birth year]   DATE OF ADMISSION:  10/20/2005  DATE OF DISCHARGE:  10/28/2005                                 DISCHARGE SUMMARY   ADMISSION DIAGNOSES:  1. Severe central spinal stenosis, L4-L5, L5-S1 due to herniated nucleus      pulposus and overgrowth with lipomatous material.  2. Diabetes.  3. Hypertension.  4. Asthma.  5. Obesity.  6. History of ulcers.  7. History of prostate cancer disease.  8. 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   DISCHARGE DIAGNOSES:  1. Status post decompressive lumbar laminectomy at L4-L5 and      microdiskectomy at L4-5 on the right.  2. Acute respiratory failure postoperatively with ventilator dependent      respiratory failure.  3. Renal insufficiency.  4. Postoperative polycythemia felt to be secondary to chronic  hypoxemia.  5. Obesity.  6. History of hypertension.  7. History of asthma/chronic obstructive pulmonary disease/tobacco use.  8. History of diabetes.   HISTORY OF PRESENT ILLNESS:  The patient is a 70 year old male who has been  having problems over the past year or so with his lower back with pains into  his right leg with numbness and tingling.  Evaluation with MRI and CT  myelogram found a broad based disk bulge at L4-5 with facet hypertrophy and  ligamentum flavum thickening causing severe central canal stenosis.  The  patient has elected to proceed with surgical correction by Dr. Darrelyn Hillock and  was admitted to [facility] for this procedure.   ALLERGIES:  NO KNOWN DRUG ALLERGIES.   MEDICATIONS ON ADMISSION:  Azar, Caduet, aspirin, Actos, Vicodin,  multivitamins, Lantus, Advair Discus.   SURGICAL PROCEDURE:  On October 20, 2005, the patient was taken to the  operating room by Dr. Worthy Rancher, assisted by Dr. Simonne Come under general  anesthesia and underwent a decompressive lumbar laminectomy at L4-5 with a  microdiskectomy at L4-5 on the right.  The patient tolerated the procedure  well.  The estimated blood loss was 250 mL.  There were no intraoperative  complications.  The patient was extubated in the operating room, found to be  unable to ventilate himself so he was reintubated by anesthesia before  leaving the operative suite and was transferred to recovery room intubated  on a ventilator and the patient was then transferred to the intensive care  unit for ventilation care.   CONSULTATIONS:  A critical care consult, pulmonary consult was requested for  evaluation and treatment of the patient's respiratory failure and multiple  medical issues.  Routine physical therapy and case management care consults  also requested.   HOSPITAL COURSE:  On October 20, 2005, the patient was admitted to [facility] under the care of Georges Lynch. Darrelyn Hillock, M.D.  The  patient was taken to the operating room where decompressive lumbar  laminectomy  at L4-5 and a microdiskectomy at L4-5 right were performed  without any intraoperative complications.  The patient developed  postoperative respiratory failure upon extubation and was reintubated, was  transferred to recovery room and then to the intensive care unit intubated  and a critical care consult was requested for evaluation and treatment of  the patient's respiratory status and multiple medical issues.  The patient  spent about four days intubated and sedated on a ventilator while pulmonary  and critical care services managed the patient's multiple medical issues and  through ventilator treatment, respiratory care treatment and adjustment of  medications.  The patient was able to be extubated about postoperative day  #4.  He spent another 24 to 48 hours in the ICU as a step down patient  with  CPAP, respiratory care services and continued to improve.  Postoperatively  when the patient was able to respond without sedation, put on the  ventilator, patient appeared to be neurologically intact in the lower  extremities.  His lower back surgical procedure site was benign for any  signs of problems.  Postoperatively once the patient was able to be  extubated, the patient was able to follow simple commands completely.  The  patient was found to be completely neurologically intact in the lower  extremities and his symptoms from preoperative had been significantly  improved with decreased pain and strong motor strength and sensation in the  lower extremities.  The patient was then transferred to the orthopedic floor  for several days of physical therapy and continued care and monitoring of  pulmonary status.  Arrangements were made, patient was to be discharged home  with outpatient home health physical therapy care by his daughter from out  of town.  The patient had recommendations for pulmonary service for  continued care and follow-up.  The patient was orthopedically and medically  felt to be stable and was discharged home with outpatient instructions and  follow-up arrangements made.  The patient was discharged in improved  condition.   LABORATORY DATA:  Blood gas on August 23 found bicarb at 30.1, TCO2 at 26.5,  base excess 2.7, oxygen saturations 9.7, base temperature 100.5 was A-line  blood sample.  The patient was on the following ventilator settings at that  time:  FIO2 of 0.6, O2 liters per minute was 50.  Ventilator mode AC.  Ventilator volume 650, rate 12, PEEP of 10. pH was 7.373, PCO2 was 49.7 and  PO2 was 98.8. H&H on August 28 found white blood cells 10.6, hemoglobin  13.0, hematocrit 39.3, platelet count 125.  Routine chemistries on August 39  found sodium 138, potassium 3.8, glucose 173, BUN 16, creatinine 1.4. Patient being managed by oral medications and  sliding scale insulin per  hospital protocol.  TSH on August 23 was 0.419.  Urinalysis on August 23  found large hemoglobin, moderate leukocyte esterase, too numerous to count  white blood cells and few bacteria.  Urine culture showed no growth.  Chest  x-ray on August 20 found no active cardiopulmonary disease.  Chest x-ray on  August 22 showed endotracheal tube tip in midtrachea, bibasilar atelectasis.  August 23 chest x-ray showed stable bibasilar atelectasis nasogastric tube  placement.  August 25 found increased bilateral lower lobe atelectasis  and/or edema question small effusions bilaterally.  August 27 showed  bibasilar aeration, otherwise chest is stable.   DISCHARGE MEDICATIONS:  1. Ventolin 2.5 mg inhaler every six hours.  2. Atrovent 0.5 inhaler every six hours.  3. Pulmicort 0.25 mg every six hours.  4. The patient was managed on the diabetic sliding insulin scale per      hospital protocol.  5. Nicotine patch 21 mg every 24 hours.  6. Cozaar 100 mg by mouth daily.  7. Hydrochlorothiazide 25 mg by mouth daily.  8. Laxative or enema of choice p.r.n.  9. Phenergan 25 mg every six hours p.r.n.  10.Tylenol 650 mg p.o. every four hours p.r.n.  11.Percocet 1 or 2 tablets every six hours p.r.n.  12.Robaxin 500 mg  by mouth every eight hours p.r.n.   DISCHARGE INSTRUCTIONS:  Diet.  The patient is to maintain a diabetic diet  per instructions.  Activity.  The patient may ambulate with assistance or use of a walker,  weightbearing as tolerated.  Wound care.  The patient is to change his dressing daily.  Medications.  As directed previously.  Follow-up.  With Windy Fast A. Gioffre, M.D. two weeks from discharge from  hospital.  The patient is to call [phone].  The patient is to contact Dr. Thurston Hole office for a follow-up appointment as  directed by [facility].  Home health care by [facility] for evaluation and management of the patient's  outpatient physical therapy  needs.   CONDITION ON DISCHARGE:  Improved and good.      Jamelle Rushing, P.A.    ______________________________  Georges Lynch Darrelyn Hillock, M.D.    RWK/MEDQ  D:  11/17/2005  T:  11/18/2005  Job:  [id number]

## 2010-07-17 NOTE — Assessment & Plan Note (Signed)
[facility]                               PULMONARY OFFICE NOTE   [name] [name], [name] [name]                       MRN:          [id number]  DATE:10/29/2005                            DOB:          01-15-1941    PULMONARY/POSTOPERATIVE FOLLOW-UP VISIT:  This 70 year old black male with  chronic obstructive pulmonary disease (baseline FEV-I of 45% predicted  documented on September 08, 2005), with a definite reversible component then (19%  improvement after bronchodilators), who continued to smoke preoperatively,  and I was asked to see postoperatively for respiratory and ventilatory  failure.  I noted that he had a hematocrit of 55%, consistent with chronic  hypoxemia.  Preoperatively I also noted that he was actively smoking and not  consistent about using his CPAP preoperatively.   Postoperatively he did well and was discharged yesterday off of oxygen.  His  daughter was concerned that he might need oxygen.  The patient states that  his breathing is actually better than it was prior to admission and he is  able to get to the bathroom and back and to the kitchen and back to bed  without difficulty; however, he has really not done much in terms of  activity and is still restricted to walking with a walker.  He denies any  exertional chest pain, orthopnea, PND or leg swelling.  He admits he is not  using his CPAP much; however, he is also not smoking.   MEDICATIONS:  1. Hyzaar.  2. Advair 500/50 mg b.i.d.  For a full dosing schedule please see the face      sheet from October 29, 2005.  3. Actos.  4. Caduet.  5. Lantus.  6. Robaxin.  7. Hydrocodone p.r.n.   PHYSICAL EXAMINATION:  GENERAL:  He is a slightly hoarse white male.  VITAL SIGNS:  Normal.  HEENT:  Dentures in place. Oropharynx clear.  He does have hoarseness and  pseudo-wheeze that improves, with voluntary efforts not to wheeze.  LUNGS:  Fields reveal diminished breath sounds but no wheeze.  HEART:  A regular rhythm without murmur, rub or gallops.  Positive previous  sign at the end of inspiration.  ABDOMEN:  Benign.  EXTREMITIES:  Warm without calf tenderness.  No clubbing, cyanosis or edema.   LABORATORY DATA:  Hemoglobin saturation 89% on room air.  He did one lap  around the office, dropping his saturation to 78% on room air.  I should say  that his oxygen saturation at rest was documented to be 86% and dropped to  78% with activity.   IMPRESSION:  Hypoxemic respiratory failure, most likely chronic, but a  combination of chronic obstructive pulmonary disease and obesity.   RECOMMENDATIONS:  1. I recommend that he use oxygen 24 hours a day.  I should say that his      oxygen saturation at rest was documented to be 86% and dropped to 78%      with activity.  I therefore recommend oxygen 24 hours a day and also      ask that it be used nocturnally.  2. I emphasized to the patient and to his daughter that long-term      compliance with CPAP and maintenance off of cigarettes is really the      make or break  issue for his rehabilitation and that weight gain and      non-use of CPAP would offset any benefits with oxygen and treatment of      chronic obstructive pulmonary disease/asthmatic component.  3. For now I recommend that he switch to the Advair 250/50 mg b.i.d.      dosing.  This should have less effect on the upper airway which appears      inflamed, (based on the hoarseness and pseudo-wheezing that he      demonstrated today).  4. I will see him back in two weeks or sooner p.r.n.                                   Charlaine Dalton. Sherene Sires, MD, [facility]   MBW/MedQ  DD:  10/29/2005  DT:  10/29/2005  Job #:  [id number]   cc:   Windy Fast A. Darrelyn Hillock, MD  Barbette Hair. Artist Pais, DO

## 2010-07-17 NOTE — Cardiovascular Report (Signed)
NAME:  [name] [name], [name] [name] NO.:  [id number]   MEDICAL RECORD NO.:  [id number]          PATIENT TYPE:  EMS   LOCATION:  ED                           FACILITY:  [facility]   PHYSICIAN:  Lyn Records, M.D.   DATE OF BIRTH:  [birth year]   DATE OF PROCEDURE:  05/02/2008  DATE OF DISCHARGE:  12/14/2007                            CARDIAC CATHETERIZATION   INDICATIONS:  Class III angina and dyspnea on exertion.   PROCEDURES PERFORMED:  1. Left and right heart catheterization.  2. Left ventriculography.  3. Selective coronary angiography.  4. Bypass graft angiography, saphenous vein.  5. Internal mammary artery graft angiography.   DESCRIPTION:  After informed consent and following 1% Xylocaine with  local infiltration, a 7-French venous sheath and a 4-French arterial  sheath were placed in the right femoral vein and artery respectively,  both using the modified Seldinger technique.  A 6-French Swan-Ganz  catheter was then used to measure pressures in the lungs and in the  wedge position.  Oximetry samples in the pulmonary artery were obtained.  A 4-French multipurpose catheter was used for left heart  catheterization, hemodynamic recordings, saphenous vein graft  angiography, and left ventriculography by hand injection.  Native right  coronary angiography was also performed with this catheter.  A 4-French  #4 left Judkins catheter and a 4-French internal mammary catheter were  used for left coronary artery angiography and internal mammary artery  angiography respectively.   The patient received a total of 2 mg of Versed and 50 mcg of fentanyl as  sedation procedure.  No complications occurred.   RESULTS:  1. Hemodynamic data:      a.     Right atrial mean pressure 9 mmHg.      b.     Right ventricular pressure 32/10 mmHg.      c.     Pulmonary artery pressure 31/11 mmHg.      d.     Pulmonary capillary wedge pressure 9 mmHg.      e.     Left ventricular pressure 100/12  mmHg.      f.     Aortic pressure 100/68 mmHg.  2. Left ventriculography:  The LV is faintly opacified.  Two      injections were performed.  Overall, left ventricular function is      normal.  No significant regional wall motion abnormalities are      noted.  EF is estimated to be 55%.  3. Coronary angiography.      a.     Left main coronary:  Left main is short.  It is widely       patent.      b.     Left anterior descending coronary:  LAD is totally occluded       in the mid vessel prior to the origin of the large first diagonal.      c.     Circumflex artery:  The first obtuse marginal is essentially       totally occluded.  The second and third obtuse marginals are  patent.  There is segmental mid circumflex and second obtuse       marginal disease up to 50-60% with no high-grade focal obstruction       is noted.  The distal circumflex supplies collaterals to the       distal right coronary as its only source of blood flow.      d.     Right coronary artery:  The previously stented right       coronary is totally occluded at the ostia.  4. Saphenous vein graft angiography.      a.     Saphenous vein graft to the obtuse marginal #1:  The graft       is patent but has diffuse proximal disease with up to 50% proximal       narrowing.      b.     Saphenous vein graft to the PDA:  This graft is large,       smooth, and widely patent.  It supplies to PDA.  Collaterals arise       from the PDA and supplied the acute marginal branch and also faint       collateralization of the distal right coronary left ventricular       branches is noted.      c.     Saphenous vein graft to the first diagonal:  This graft is       widely patent and also fills the mid and distal LAD by reflux of       contrast into the LAD via the diagonal.  No significant       obstructions are noted in the LAD on the diagonal antegrade to the       graft insertion site in the diagonal.  5. Left internal mammary  graft to the LAD:  Totally occluded.   CONCLUSION:  1. Patent saphenous vein grafts with moderate disease in the saphenous      vein graft to the obtuse marginal.  The marginal is relatively      small.  The saphenous vein graft to the first diagonal into the      right coronary are widely patent.  2. Severe native vessel coronary artery disease with total occlusion      of mid LAD, total occlusion (functional) of the first obtuse      marginal and total occlusion of the proximal RCA.  The mid      circumflex and second obtuse marginal contain moderate but not      severe obstruction.  3. Normal left ventricular function.  4. Totally occluded LIMA to the LAD.  5. Normal pulmonary artery pressures.   PLAN:  Continue medical therapy.  Consider cardiopulmonary rehab.   ADDENDUM:  The patient's coronaries are heavily calcified, and perhaps  the patient's angina is related to microvascular disease progression.  I  will consider increasing Ranexa therapy.      Lyn Records, M.D.  Electronically Signed     HWS/MEDQ  D:  05/02/2008  T:  05/03/2008  Job:  [id number]   cc:   Gwen Pounds, MD

## 2010-07-17 NOTE — Letter (Signed)
September 30, 2005     Windy Fast A. [name] Hillock, MD  [address]  [place], [place] [address]   RE:  CYNCERE, SONTAG  MRN:  [id number]  /  DOB:  10-18-1940   Dear Dr. Darrelyn [name],   This letter is in reference to some medical clearance for upcoming surgery  for patient, [name] [name].  He apparently is scheduled for decompressive  lumbar laminectomy and microdiskectomy at L4-5.  He has a past medical  history of type 2 diabetes, hypertension, hyperlipidemia, obstructive sleep  apnea, and COPD/asthma with ongoing tobacco abuse.  The patient from a  diabetic standpoint has been fairly well controlled.  We had to recently  discontinue his Glucophage secondary to chronic renal insufficiency.  His  serum creatinine climbed to 1.5.  He has been stabilized with Lantus insulin  and has been doing fairly well.  Despite numerous attempts to discontinue  smoking, the patient continues to smoke 1-2 packs per day.  He has been  tried on Chantix in the past but did not tolerate.  He does have a history  of obstructive airway disease and is chronically on Advair therapy.   He does not have a history of coronary artery disease, and we did repeat a  stress test on September 06, 2005, read by Dr. Jens Som.  The patient had low-risk  Adenosine nuclear study.  He does not have any history of aortic stenosis.  No complaint of chest pain.   CURRENT MEDICATIONS:  1.  Actos 15 mg once daily.  2.  Advair 500/50 1 puff b.i.d.  3.  Aspirin 81 mg once daily.  4.  Hyzaar 100/25 p.o. once daily.  5.  Flexeril 10 mg b.i.d.  6.  Caduet 10/40 1 daily.  7.  Jenuvia 100 mg 1 daily.  8.  Lantus insulin 24 units in the a.m.  9.  Viagra 50 mg p.r.n.   ALLERGIES TO MEDICATIONS:  None known.   PHYSICAL EXAMINATION:  VITAL SIGNS:  The last physical exam of August 03, 2005  showed a weight of 243 pounds.  Temperature 97.7, pulse 92, BP 124/73.  GENERAL:  Patient is a pleasant, overweight 70 year old African-American  male in  no apparent distress.  HEENT:  Normocephalic and atraumatic.  Pupils are equal, round and reactive  to light bilaterally.  Extraocular motility is intact.  Patient was  anicteric.  Conjunctivae was within normal limits.  CHEST:  Normal respiratory effort.  Chest was clear to auscultation  bilaterally.  No rales, rhonchi or wheezes.  CARDIOVASCULAR:  Regular rate and rhythm.  No significant murmurs, rubs or  gallops appreciated.  ABDOMEN:  Soft and nontender.  Positive bowel sounds.  No organomegaly.  MUSCULOSKELETAL:  No clubbing, cyanosis or edema.  Patient has somewhat  diminished dorsalis pedis pulses.   IMPRESSION/RECOMMENDATIONS:  Patient has fairly low risk from a  cardiovascular standpoint.  The patient does have some potential increased  risk due to his ongoing tobacco abuse; however, he does not have any active  wheezing and is fairly well compensated on his Advair therapy.  He will need  to be monitored closely perioperatively.  I would not recommend starting a  beta blocker before his upcoming procedure, which may potentially exacerbate  his asthma/airway obstruction.   He should continue his diabetic regimen but should probably hold his Caduet  or Lipitor prior to his surgery.   Please let me know if I can be of any further assistance.    Sincerely,  Barbette Hair. Artist Pais, DO   RDY/MedQ  DD:  09/30/2005  DT:  09/30/2005  Job #:  [id number]

## 2010-07-17 NOTE — Op Note (Signed)
NAME:  [name] [name], [name] [name]                ACCOUNT NO.:  [id number]   MEDICAL RECORD NO.:  [id number]          PATIENT TYPE:  INP   LOCATION:  0158                         [facility]ACILITY:  [facility]   PHYSICIAN:  Georges Lynch. Gioffre, M.D.DATE OF BIRTH:  04-22-1940   DATE OF PROCEDURE:  10/20/2005  DATE OF DISCHARGE:                                 OPERATIVE REPORT   SURGEON:  Dr. Ranee Gosselin.   ASSISTANT:  Dr. Marlowe Kays.   PREOPERATIVE DIAGNOSES:  1. Severe spinal stenosis at L4-5.  2. Herniated disk central to the right.   POSTOPERATIVE DIAGNOSES:  1. Severe spinal stenosis at L4-5.  2. Herniated disk central to the right.   Note he had severe pain in his right leg only.   PROCEDURE:  1. Complete decompressive lumbar laminectomy at L4-5 for spinal stenosis.  2. Microdiskectomy at L4-5 on the right for a herniated disk.   DESCRIPTION OF PROCEDURE:  Under general anesthesia, routine orthopedic prep  and draping of the lower back was carried out.  He first had 1 gram of IV  Ancef.  Two needles were placed in the back for localization purposes, x-ray  was taken to verify the position.  Following this, I identified the L4 and  the L5 spinous processes. The muscle was stripped from the lamina and  spinous processes bilaterally and the self-retaining retractors were  inserted.  At this time, another x-ray was taken to verify our position.  Following that, I removed spinous process of L4 and part of L5.  I removed a  small portion of the L3 spinous process above and then removed the lamina in  the usual fashion at the L4 and L5 region.  At this time, I did a central  decompression.  We were able to easily expose the dura.  There was a lipoma  on the dura, it was not large.  It was not compressing the dura so we  elected to use that as a protective covering.  We then identified the 5  root. We went out and did a decompression of the lateral recess bilaterally  and recauterized the lateral  recess veins.  We then took another x-ray of  the needle in the disk space and identified the disk space at L4-5.  At that  time, the dura was gently retracted with a D'Errico retractor.  We then made  a cruciate incision in the posterior longitudinal ligament and then did a  microdiskectomy in the usual fashion.  The nerve root and the dura now was  quite free.  We thoroughly irrigated out the area and reinspected the dura  and the root. We were able to easily pass the hockey-stick out the foramina  in the usual fashion.  There was no other abnormalities noted.  We  thoroughly irrigated the area again, loosely applied some thrombin-soaked  Gelfoam and I then inserted a quarter inch Penrose drain, closed the wound  in layers in the usual fashion except I left the deep proximal part of the  wound partially open for drainage purposes as well as using the drain.  The  skin was closed  with metal staples.  Prior to closing the skin, I injected 20 mL of 0.5%  Marcaine with epinephrine into the surrounding muscle.  He also had 30 mg of  IV Toradol prior to leaving surgery.  The skin was closed with staples.  A  sterile Neosporin dressing was applied.           ______________________________  Georges Lynch Darrelyn Hillock, M.D.     RAG/MEDQ  D:  10/20/2005  T:  10/21/2005  Job:  [id number]

## 2010-07-17 NOTE — H&P (Signed)
NAME:  [name] [name], [name] [name]                ACCOUNT NO.:  [id number]   MEDICAL RECORD NO.:  [id number]          PATIENT TYPE:  AMB   LOCATION:  DAY                          FACILITY:  [facility]   PHYSICIAN:  Georges Lynch. Gioffre, M.D.DATE OF BIRTH:  03-Oct-1940   DATE OF ADMISSION:  10/20/2005  DATE OF DISCHARGE:                                HISTORY & PHYSICAL   CHIEF COMPLAINT:  Lower back and right leg pain down to the foot.   HISTORY OF PRESENT ILLNESS:  The patient 70 year old, male who has been  having problems over the last year with lower back pain and pain into the  right leg with numbness and tingling all the way down to the foot.  The  patient was evaluated by Dr. Darrelyn Hillock with MRI and CT myelogram found to have  a large, broad-based disk bulge at L4-L5 with facet hypertrophy and  ligamentum flavum thickening contributing to severe central canal stenosis  with moderate to severe right neural foraminal narrowing and moderate left  neural foramen narrowing.  The patient also found to have some moderate left  neural foraminal narrowing and mild right neural foraminal at L5-S1,  moderate to severe central canal stenosis at L5-S1.  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  The patient has discussed  treatment options and would like to proceed with Dr. Darrelyn Hillock with a  decompressive lumbar laminectomy and microdiskectomy at L4-L5 on the right.   ALLERGIES:  NO KNOWN DRUG ALLERGIES.   CURRENT MEDICATIONS:  Hyzaar, Caduet, aspirin, Actos, hydrocodone,  multivitamins, Lantus insulin, Advair discus.   PAST MEDICAL HISTORY:  1. Asthma.  2. Hypertension.  3. Diabetes.  4. History of ulcers.  5. History of constipation.  6. Arthritis.  7. Tobacco use.  8. Central obesity.   PAST SURGICAL HISTORY:  Prostate and circumcision with no anesthesia  complications.   FAMILY HISTORY:  Mother is deceased from an MI in her 62s.  Father deceased  from MI at 22.  Also has a strong  history of cancer, diabetes and arthritis  and gout in the family.   SOCIAL HISTORY:  The patient is divorced.  He is on Disability for his back,  clubbed foot, diabetes and asthma.  He stills smokes two packs a day.  He  uses no alcohol.  He lives by himself in a [place] house.   PRIMARY CARE PHYSICIAN:  Dr. Thurnell Lose an Dr. Lucianne Muss.   PHYSICAL EXAMINATION:  VITALS:  Height is 5 feet 8 inches, weight is 254.  GENERAL:  This is a centrally obese, black male who appears to be in no  acute distress.  He does have just a little bit of difficulty transition  from the sitting/standing position onto the exam table.  HEENT:  Head was normocephalic.  Pupils equal, round and reactive.  Oral  buccal mucosa was pink and moist.  NECK:  Supple.  No palpable lymphadenopathy.  No bruits.  No thyroid  discomfort.  Good range of motion.  CHEST:  Lung sounds were distant, but clear bilaterally.  No wheezes, rales,  rhonchi.  HEART:  Regular rate and rhythm, no murmurs, rubs or gallops.  ABDOMEN:  Abdomen was soft, obese-like, normal bowel sounds.  No CVA region  tenderness.  EXTREMITIES:  Upper extremities were symmetrically sized and shaped.  He had  a little difficulty with range of motion of his shoulders, but he was able  to reach them vertically up in the ceiling.  He had full range of motion of  his elbows and wrists.  Motor strength was 5/5.  Lower extremities with  right and left hip had full extension/flexion up to 110 degrees.  He did  have some slight weakness on the right side with hip flexors.  Knee  extension was full flexion back to 100 degrees.  Strength was 5/5.  Ankles  were swollen and edematous with 2+ pitting edema bilaterally.  Good range of  motion of the ankles.  Motor strength was 5/5 at the ankle and great toe  extension.  The patient had light touch sensation throughout the entire  lower extremities.  BREAST, RECTAL AND GU:  Exams were deferred.   IMPRESSION:  1. 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  2. Diabetes.  3. Hypertension.  4. Asthma.  5. Obesity.  6. History of ulcers.  7. History of prostate disease.  8. History of seizure in 1995, related to smoking and patch use.   DISPOSITION:  The patient has been evaluated for medical clearance by Dr.  Thurnell Lose and has been cleared for his upcoming surgical procedure.  The patient  has been to the hospital ready for his preoperative evaluation with medical  workup and labs.  The patient will undergo a decompressive lumbar  laminectomy and microdiskectomy at L4-L5 by Dr. Darrelyn Hillock at [facility] on August 22.  Dr. Simonne Come to assist.      Jamelle Rushing, P.A.    ______________________________  Georges Lynch. Darrelyn Hillock, M.D.    RWK/MEDQ  D:  10/18/2005  T:  10/18/2005  Job:  [id number]

## 2010-08-03 ENCOUNTER — Telehealth: Payer: Self-pay | Admitting: Internal Medicine

## 2010-08-03 NOTE — Telephone Encounter (Signed)
Pt was given 2 samples of advair 250/50.    Lot # [id number] Exp Date 08/2011

## 2010-08-07 ENCOUNTER — Encounter: Payer: Medicare PPO | Admitting: Cardiology

## 2010-08-13 ENCOUNTER — Ambulatory Visit: Payer: Medicare PPO | Admitting: Family Medicine

## 2010-08-17 NOTE — Telephone Encounter (Signed)
DONE

## 2010-09-03 ENCOUNTER — Encounter: Payer: Self-pay | Admitting: Family Medicine

## 2010-09-03 ENCOUNTER — Ambulatory Visit (HOSPITAL_COMMUNITY)
Admission: RE | Admit: 2010-09-03 | Discharge: 2010-09-03 | Disposition: A | Discharge status: Home / Self Care | Payer: Medicare PPO | Source: Ambulatory Visit | Attending: Family Medicine | Admitting: Family Medicine

## 2010-09-03 ENCOUNTER — Ambulatory Visit (INDEPENDENT_AMBULATORY_CARE_PROVIDER_SITE_OTHER): Payer: Medicare PPO | Admitting: Family Medicine

## 2010-09-03 DIAGNOSIS — M25559 Pain in unspecified hip: Secondary | ICD-10-CM | POA: Insufficient documentation

## 2010-09-03 DIAGNOSIS — N529 Male erectile dysfunction, unspecified: Secondary | ICD-10-CM

## 2010-09-03 DIAGNOSIS — E119 Type 2 diabetes mellitus without complications: Secondary | ICD-10-CM

## 2010-09-03 DIAGNOSIS — R1032 Left lower quadrant pain: Secondary | ICD-10-CM | POA: Insufficient documentation

## 2010-09-03 DIAGNOSIS — F17200 Nicotine dependence, unspecified, uncomplicated: Secondary | ICD-10-CM

## 2010-09-03 LAB — POCT GLYCOSYLATED HEMOGLOBIN (HGB A1C): Hemoglobin A1C: 7.1

## 2010-09-03 NOTE — Progress Notes (Signed)
  Subjective:    Patient ID: [name] [name], male    DOB: August 04, 1940, 70 y.o.   MRN: [id number]  HPI He is here for a recheck. He is no longer having abdominal discomfort. He continues on medications listed in the chart. He is very inactive. He continues on oxygen therapy and continues to smoke. He does turn the oxygen off prior to smoking. He complains of a three-month history of vague left thigh discomfort as well as right gluteal discomfort both of these tend to get worse with physical activities. He states his blood sugars run between 80 and 135. He does continue to have difficulty with erectile dysfunction and would like to try different medication.  Review of Systems Negative except as above    Objective:   Physical Exam Alert and in no distress. Exam of the lower abdomen shows no palpable lesions. Hernia check is negative. Good motion of the hip. No tenderness to palpation of the hip or pelvic area. X-rays were negative. Hemoglobin A1c is 7.1.       Assessment & Plan:  Diabetes. Hip pain. ED. Obesity. Active smoker. A sample of Levitra was given with instructions on use. He will return here in a week or 2 if he has continued difficulty with the hip pain. Continue on other medications. Again encouraged him to quit smoking.

## 2010-09-04 ENCOUNTER — Encounter (INDEPENDENT_AMBULATORY_CARE_PROVIDER_SITE_OTHER): Payer: Medicare PPO | Admitting: Cardiology

## 2010-09-04 ENCOUNTER — Telehealth: Payer: Self-pay

## 2010-09-04 DIAGNOSIS — E1159 Type 2 diabetes mellitus with other circulatory complications: Secondary | ICD-10-CM

## 2010-09-04 DIAGNOSIS — I70219 Atherosclerosis of native arteries of extremities with intermittent claudication, unspecified extremity: Secondary | ICD-10-CM

## 2010-09-04 NOTE — Telephone Encounter (Signed)
Left message for pt xray neg and to please return here next week for follow up

## 2010-09-07 ENCOUNTER — Encounter: Payer: Self-pay | Admitting: Podiatry

## 2010-11-04 ENCOUNTER — Ambulatory Visit (INDEPENDENT_AMBULATORY_CARE_PROVIDER_SITE_OTHER): Payer: Medicare Other | Admitting: Family Medicine

## 2010-11-04 ENCOUNTER — Encounter: Payer: Self-pay | Admitting: Family Medicine

## 2010-11-04 VITALS — BP 116/70 | HR 100 | Wt 252.0 lb

## 2010-11-04 DIAGNOSIS — G4730 Sleep apnea, unspecified: Secondary | ICD-10-CM

## 2010-11-04 DIAGNOSIS — E119 Type 2 diabetes mellitus without complications: Secondary | ICD-10-CM

## 2010-11-04 DIAGNOSIS — R109 Unspecified abdominal pain: Secondary | ICD-10-CM

## 2010-11-04 DIAGNOSIS — F17200 Nicotine dependence, unspecified, uncomplicated: Secondary | ICD-10-CM

## 2010-11-04 DIAGNOSIS — Z23 Encounter for immunization: Secondary | ICD-10-CM

## 2010-11-04 DIAGNOSIS — E669 Obesity, unspecified: Secondary | ICD-10-CM

## 2010-11-04 MED ORDER — PANTOPRAZOLE SODIUM 40 MG PO TBEC: 40.0000 mg | DELAYED_RELEASE_TABLET | Freq: Every day | ORAL | Status: DC

## 2010-11-04 NOTE — Patient Instructions (Signed)
Put the Bible and a cross near your CPAP machine and see if that will help.

## 2010-11-04 NOTE — Progress Notes (Signed)
  Subjective:    Patient ID: [name] [name], male    DOB: Jul 30, 1940, 70 y.o.   MRN: [id number]  HPI He is here for followup visit. He was admitted to the hospital in March and since then has done fairly well except in the last week. He is again complaining of abdominal pain, anorexia fatigue. He has been using ranitidine which has helped with the symptoms. He stopped taking Protonix thinking he was not supposed to take it anymore. He has not seen his gastroenterologist or surgeon. He states his blood sugars run around 135. He continues on Lantus 15  and Apidra .He does continue to smoke. He is not using his CPAP machine stating he thinks that it is possesed by spirits. He has tried using the Bible and holy water with no success. He states he has had the machine checked and it is functioning normally.  Review of Systems     Objective:   Physical Exam Alert and appearing slightly short of breath with his oxygen tank with him.       Assessment & Plan:  Abdominal pain. Noncompliance. Cigarette abuse. Sleep apnea. Diabetes. I will place him back on protonix and he will let me know how he is doing. I recommend that he also use a crucifix to see if this will help to get rid of the spirits

## 2010-11-17 ENCOUNTER — Telehealth: Payer: Self-pay | Admitting: Internal Medicine

## 2010-11-17 NOTE — Telephone Encounter (Signed)
Sample at front for pick up-pt aware.

## 2010-11-24 ENCOUNTER — Telehealth: Payer: Self-pay | Admitting: Internal Medicine

## 2010-11-24 NOTE — Telephone Encounter (Signed)
2 samples of advair 250/50 given to pt.     Lot # [id number] Exp Date 09/2011

## 2010-11-30 LAB — CBC
MCHC: 32.4
MCV: 94
RDW: 13.9

## 2010-11-30 LAB — POCT CARDIAC MARKERS
CKMB, poc: 1.3
Myoglobin, poc: 151
Troponin i, poc: <0.05
Troponin i, poc: <0.05
Troponin i, poc: <0.05

## 2010-11-30 LAB — DIFFERENTIAL
Basophils Absolute: 0.3 — ABNORMAL HIGH
Basophils Relative: 3 — ABNORMAL HIGH
Eosinophils Absolute: 0.1
Monocytes Absolute: 0.8
Monocytes Relative: 9
Neutro Abs: 6.1
Neutrophils Relative %: 68

## 2010-11-30 LAB — BASIC METABOLIC PANEL
BUN: 34 — ABNORMAL HIGH
Calcium: 9.2
Chloride: 98
Creatinine, Ser: 1.83 — ABNORMAL HIGH
GFR calc Af Amer: 45 — ABNORMAL LOW
GFR calc non Af Amer: 37 — ABNORMAL LOW

## 2010-11-30 LAB — GLUCOSE, CAPILLARY
Glucose-Capillary: 143 — ABNORMAL HIGH
Glucose-Capillary: 38 — CL

## 2010-12-08 LAB — COMPREHENSIVE METABOLIC PANEL
ALT: 44
AST: 30
Albumin: 3.8
Alkaline Phosphatase: 94
CO2: 29
Chloride: 102
GFR calc Af Amer: 44 — ABNORMAL LOW
GFR calc non Af Amer: 37 — ABNORMAL LOW
Potassium: 4.9
Total Bilirubin: 0.9

## 2010-12-08 LAB — DIFFERENTIAL
Basophils Absolute: 0.1
Basophils Relative: 1
Eosinophils Absolute: 0.1
Eosinophils Relative: 1
Monocytes Absolute: 0.6

## 2010-12-08 LAB — URINALYSIS, ROUTINE W REFLEX MICROSCOPIC
Bilirubin Urine: NEGATIVE
Ketones, ur: NEGATIVE
Nitrite: NEGATIVE
Protein, ur: NEGATIVE
pH: 5

## 2010-12-08 LAB — CBC
MCV: 90.9
Platelets: 88 — ABNORMAL LOW
RBC: 6.27 — ABNORMAL HIGH
WBC: 8.8

## 2010-12-09 LAB — COMPREHENSIVE METABOLIC PANEL
ALT: 27
Alkaline Phosphatase: 95
CO2: 24
Chloride: 103
GFR calc non Af Amer: 44 — ABNORMAL LOW
Glucose, Bld: 112 — ABNORMAL HIGH
Potassium: 4.4
Sodium: 138

## 2010-12-09 LAB — DIFFERENTIAL
Basophils Absolute: 0.1
Basophils Relative: 0
Eosinophils Absolute: 0
Eosinophils Absolute: 0.1
Eosinophils Relative: 1
Lymphocytes Relative: 25
Monocytes Relative: 6
Neutrophils Relative %: 65
Neutrophils Relative %: 71

## 2010-12-09 LAB — SAMPLE TO BLOOD BANK

## 2010-12-09 LAB — CBC
HCT: 57 — ABNORMAL HIGH
Hemoglobin: 18.6 — ABNORMAL HIGH
MCV: 90.7
Platelets: 71 — ABNORMAL LOW
RBC: 6.16 — ABNORMAL HIGH
RDW: 13.8

## 2010-12-09 LAB — HEPATIC FUNCTION PANEL
Indirect Bilirubin: 0.9
Total Protein: 7.4

## 2010-12-09 LAB — BASIC METABOLIC PANEL
BUN: 25 — ABNORMAL HIGH
Creatinine, Ser: 2.04 — ABNORMAL HIGH
GFR calc non Af Amer: 33 — ABNORMAL LOW
Glucose, Bld: 125 — ABNORMAL HIGH
Potassium: 4.6

## 2010-12-09 LAB — OCCULT BLOOD X 1 CARD TO LAB, STOOL: Fecal Occult Bld: NEGATIVE

## 2010-12-15 ENCOUNTER — Telehealth: Payer: Self-pay | Admitting: Family Medicine

## 2010-12-15 MED ORDER — SIMVASTATIN 40 MG PO TABS: 40.0000 mg | ORAL_TABLET | Freq: Every day | ORAL | Status: DC

## 2010-12-15 NOTE — Telephone Encounter (Signed)
Med sent in per pt request 

## 2010-12-28 ENCOUNTER — Telehealth: Payer: Self-pay | Admitting: Family Medicine

## 2010-12-28 MED ORDER — INSULIN GLULISINE 100 UNIT/ML IJ SOLN: 15.0000 [IU] | Freq: Three times a day (TID) | INTRAMUSCULAR | Status: DC

## 2010-12-28 MED ORDER — PANTOPRAZOLE SODIUM 40 MG PO TBEC: 40.0000 mg | DELAYED_RELEASE_TABLET | Freq: Every day | ORAL | Status: DC

## 2010-12-28 MED ORDER — SIMVASTATIN 40 MG PO TABS: 40.0000 mg | ORAL_TABLET | Freq: Every day | ORAL | Status: DC

## 2010-12-28 MED ORDER — FLUTICASONE-SALMETEROL 250-50 MCG/DOSE IN AEPB: 1.0000 | INHALATION_SPRAY | Freq: Two times a day (BID) | RESPIRATORY_TRACT | Status: DC

## 2010-12-28 MED ORDER — LISINOPRIL 20 MG PO TABS: 20.0000 mg | ORAL_TABLET | Freq: Every day | ORAL | Status: DC

## 2010-12-28 MED ORDER — GLIMEPIRIDE 2 MG PO TABS: 2.0000 mg | ORAL_TABLET | Freq: Two times a day (BID) | ORAL | Status: DC

## 2010-12-28 MED ORDER — INSULIN GLARGINE 100 UNIT/ML ~~LOC~~ SOLN: 15.0000 [IU] | Freq: Two times a day (BID) | SUBCUTANEOUS | Status: DC

## 2010-12-28 MED ORDER — ESCITALOPRAM OXALATE 20 MG PO TABS: 40.0000 mg | ORAL_TABLET | Freq: Every day | ORAL | Status: DC

## 2010-12-28 MED ORDER — COLCHICINE 0.6 MG PO TABS: 0.6000 mg | ORAL_TABLET | Freq: Every day | ORAL | Status: DC

## 2010-12-28 MED ORDER — FUROSEMIDE 20 MG PO TABS: 20.0000 mg | ORAL_TABLET | Freq: Three times a day (TID) | ORAL | Status: DC

## 2010-12-28 MED ORDER — PARICALCITOL 2 MCG PO CAPS: 2.0000 ug | ORAL_CAPSULE | ORAL | Status: DC

## 2010-12-28 MED ORDER — ALLOPURINOL 100 MG PO TABS: 100.0000 mg | ORAL_TABLET | Freq: Two times a day (BID) | ORAL | Status: DC

## 2010-12-28 MED ORDER — FLUTICASONE PROPIONATE 50 MCG/ACT NA SUSP: 2.0000 | Freq: Every day | NASAL | Status: DC

## 2010-12-28 NOTE — Telephone Encounter (Signed)
Pt called and left message he needed all of his meds renewed.  He was here for diabetes check in September.  Does he need to come in for med check? Please advise on renewals

## 2010-12-28 NOTE — Telephone Encounter (Signed)
Meds were renewed 

## 2010-12-30 ENCOUNTER — Telehealth: Payer: Self-pay | Admitting: Family Medicine

## 2010-12-30 MED ORDER — PARICALCITOL 2 MCG PO CAPS: 2.0000 ug | ORAL_CAPSULE | ORAL | Status: DC

## 2010-12-30 MED ORDER — GLIMEPIRIDE 2 MG PO TABS: 2.0000 mg | ORAL_TABLET | Freq: Two times a day (BID) | ORAL | Status: DC

## 2010-12-30 NOTE — Telephone Encounter (Signed)
Medications were rewritten with correct dosing

## 2011-02-04 ENCOUNTER — Telehealth: Payer: Self-pay | Admitting: Family Medicine

## 2011-02-04 MED ORDER — FUROSEMIDE 40 MG PO TABS: ORAL_TABLET | ORAL | Status: DC

## 2011-02-04 NOTE — Telephone Encounter (Signed)
PT SAID HE IS NOT USING RESTROOM AS MUCH AND DR.DIDARING IS THE ONE WHO CHANGED HIS MED AND HIS CONCERN IS THAT THE 40 MG 2 AM AND 1 PM IS NOT STRONG ENOUGH I ASKED PT TO CALL DR. DIDARING  TO SEE WHAT HE WOULD DO PT SAID CHANGING IT TO THE WAY IT WAS IS NOT STRONG ENOUGH

## 2011-02-04 NOTE — Telephone Encounter (Signed)
The furosemide prescription was changed to 40 mg 2 in the Am and one in the Pm. which he had been on in the past.

## 2011-02-04 NOTE — Telephone Encounter (Signed)
Let him know that I switched to the dosing schedule he was on previously.

## 2011-02-04 NOTE — Telephone Encounter (Signed)
Pt called and said he has been on Lasix 40 mg 2 in am 1 in pm and his last refill was for 20 mg tid?    Please advise.

## 2011-03-09 ENCOUNTER — Ambulatory Visit (INDEPENDENT_AMBULATORY_CARE_PROVIDER_SITE_OTHER): Payer: Medicare Other | Admitting: Family Medicine

## 2011-03-09 ENCOUNTER — Encounter: Payer: Self-pay | Admitting: Family Medicine

## 2011-03-09 DIAGNOSIS — E1169 Type 2 diabetes mellitus with other specified complication: Secondary | ICD-10-CM

## 2011-03-09 DIAGNOSIS — I152 Hypertension secondary to endocrine disorders: Secondary | ICD-10-CM

## 2011-03-09 DIAGNOSIS — N183 Chronic kidney disease, stage 3 unspecified: Secondary | ICD-10-CM

## 2011-03-09 DIAGNOSIS — E119 Type 2 diabetes mellitus without complications: Secondary | ICD-10-CM

## 2011-03-09 DIAGNOSIS — E1159 Type 2 diabetes mellitus with other circulatory complications: Secondary | ICD-10-CM

## 2011-03-09 DIAGNOSIS — M25551 Pain in right hip: Secondary | ICD-10-CM

## 2011-03-09 DIAGNOSIS — Z23 Encounter for immunization: Secondary | ICD-10-CM

## 2011-03-09 DIAGNOSIS — M109 Gout, unspecified: Secondary | ICD-10-CM

## 2011-03-09 DIAGNOSIS — M25559 Pain in unspecified hip: Secondary | ICD-10-CM

## 2011-03-09 DIAGNOSIS — I10 Essential (primary) hypertension: Secondary | ICD-10-CM

## 2011-03-09 LAB — POCT GLYCOSYLATED HEMOGLOBIN (HGB A1C): Hemoglobin A1C: 6.1

## 2011-03-09 NOTE — Progress Notes (Signed)
  Subjective:    Patient ID: [name] [name], male    DOB: [birth year], 71 y.o.   MRN: [id number]  HPI He is here for an interval evaluation. He mainly complains of right hip and inguinal discomfort. He states that when he coughs, the pain is made worse and he's noted a bulging sensation in the right upper thigh area but not inguinal area. Apparently the bulge goes away very quickly. He did have x-rays in July on the hip which were essentially negative He continues on medications listed in the chart. He continues on oxygen therapy for his underlying COPD. Presently he is not taking any gout medication and apparently has not had a flare in quite some time. He is routinely seen by renal. Apparently he did have recent blood work and evaluation done.  Review of Systems     Objective:   Physical Exam  alert and in no distress. Exam of the right hip shows fairly good motion with some pain on motion. No inguinal hernia is noted. No masses or painful lesions noted in the right upper thigh area. Negative straight leg raising. DTRs were diminished.       Assessment & Plan:   1. Diabetes mellitus  POCT HgB A1C  2. Right hip pain  Ambulatory referral to Orthopedic Surgery  3. CKD (chronic kidney disease) stage 3, GFR 30-59 ml/min    4. Gout    5. Hypertension associated with diabetes     remain off the allopurinol and colchicine area of continue other medications. Follow up here in several months

## 2011-03-10 ENCOUNTER — Ambulatory Visit: Payer: Medicare Other | Admitting: Family Medicine

## 2011-03-31 ENCOUNTER — Telehealth: Payer: Self-pay | Admitting: Family Medicine

## 2011-03-31 NOTE — Telephone Encounter (Signed)
DONE

## 2011-04-16 ENCOUNTER — Emergency Department (HOSPITAL_COMMUNITY): Payer: Medicare Other

## 2011-04-16 ENCOUNTER — Encounter (HOSPITAL_COMMUNITY): Payer: Self-pay | Admitting: Emergency Medicine

## 2011-04-16 ENCOUNTER — Other Ambulatory Visit: Payer: Self-pay

## 2011-04-16 ENCOUNTER — Inpatient Hospital Stay (HOSPITAL_COMMUNITY)
Admission: EM | Admit: 2011-04-16 | Discharge: 2011-04-28 | DRG: 189 | Disposition: A | Discharge status: Home / Self Care | Payer: Medicare Other | Attending: Internal Medicine | Admitting: Internal Medicine

## 2011-04-16 DIAGNOSIS — J40 Bronchitis, not specified as acute or chronic: Secondary | ICD-10-CM | POA: Diagnosis present

## 2011-04-16 DIAGNOSIS — E785 Hyperlipidemia, unspecified: Secondary | ICD-10-CM | POA: Insufficient documentation

## 2011-04-16 DIAGNOSIS — E872 Acidosis, unspecified: Secondary | ICD-10-CM | POA: Diagnosis present

## 2011-04-16 DIAGNOSIS — J411 Mucopurulent chronic bronchitis: Secondary | ICD-10-CM | POA: Diagnosis present

## 2011-04-16 DIAGNOSIS — F329 Major depressive disorder, single episode, unspecified: Secondary | ICD-10-CM | POA: Diagnosis present

## 2011-04-16 DIAGNOSIS — Z794 Long term (current) use of insulin: Secondary | ICD-10-CM

## 2011-04-16 DIAGNOSIS — J811 Chronic pulmonary edema: Secondary | ICD-10-CM | POA: Diagnosis present

## 2011-04-16 DIAGNOSIS — K5730 Diverticulosis of large intestine without perforation or abscess without bleeding: Secondary | ICD-10-CM | POA: Diagnosis present

## 2011-04-16 DIAGNOSIS — I279 Pulmonary heart disease, unspecified: Secondary | ICD-10-CM | POA: Diagnosis present

## 2011-04-16 DIAGNOSIS — Z8546 Personal history of malignant neoplasm of prostate: Secondary | ICD-10-CM

## 2011-04-16 DIAGNOSIS — R748 Abnormal levels of other serum enzymes: Secondary | ICD-10-CM | POA: Diagnosis present

## 2011-04-16 DIAGNOSIS — E1165 Type 2 diabetes mellitus with hyperglycemia: Secondary | ICD-10-CM | POA: Insufficient documentation

## 2011-04-16 DIAGNOSIS — IMO0002 Reserved for concepts with insufficient information to code with codable children: Secondary | ICD-10-CM | POA: Insufficient documentation

## 2011-04-16 DIAGNOSIS — K219 Gastro-esophageal reflux disease without esophagitis: Secondary | ICD-10-CM | POA: Diagnosis present

## 2011-04-16 DIAGNOSIS — A498 Other bacterial infections of unspecified site: Secondary | ICD-10-CM | POA: Diagnosis present

## 2011-04-16 DIAGNOSIS — M129 Arthropathy, unspecified: Secondary | ICD-10-CM | POA: Diagnosis present

## 2011-04-16 DIAGNOSIS — D696 Thrombocytopenia, unspecified: Secondary | ICD-10-CM | POA: Insufficient documentation

## 2011-04-16 DIAGNOSIS — G40909 Epilepsy, unspecified, not intractable, without status epilepticus: Secondary | ICD-10-CM | POA: Diagnosis present

## 2011-04-16 DIAGNOSIS — J189 Pneumonia, unspecified organism: Secondary | ICD-10-CM | POA: Diagnosis present

## 2011-04-16 DIAGNOSIS — F3289 Other specified depressive episodes: Secondary | ICD-10-CM | POA: Diagnosis present

## 2011-04-16 DIAGNOSIS — Z6838 Body mass index (BMI) 38.0-38.9, adult: Secondary | ICD-10-CM

## 2011-04-16 DIAGNOSIS — J441 Chronic obstructive pulmonary disease with (acute) exacerbation: Secondary | ICD-10-CM

## 2011-04-16 DIAGNOSIS — J9600 Acute respiratory failure, unspecified whether with hypoxia or hypercapnia: Secondary | ICD-10-CM

## 2011-04-16 DIAGNOSIS — Z9119 Patient's noncompliance with other medical treatment and regimen: Secondary | ICD-10-CM

## 2011-04-16 DIAGNOSIS — N189 Chronic kidney disease, unspecified: Secondary | ICD-10-CM | POA: Diagnosis present

## 2011-04-16 DIAGNOSIS — F17200 Nicotine dependence, unspecified, uncomplicated: Secondary | ICD-10-CM | POA: Diagnosis present

## 2011-04-16 DIAGNOSIS — E875 Hyperkalemia: Secondary | ICD-10-CM | POA: Diagnosis not present

## 2011-04-16 DIAGNOSIS — K3184 Gastroparesis: Secondary | ICD-10-CM | POA: Diagnosis present

## 2011-04-16 DIAGNOSIS — G4730 Sleep apnea, unspecified: Secondary | ICD-10-CM | POA: Diagnosis present

## 2011-04-16 DIAGNOSIS — I2781 Cor pulmonale (chronic): Secondary | ICD-10-CM

## 2011-04-16 DIAGNOSIS — R319 Hematuria, unspecified: Secondary | ICD-10-CM | POA: Diagnosis not present

## 2011-04-16 DIAGNOSIS — I10 Essential (primary) hypertension: Secondary | ICD-10-CM | POA: Insufficient documentation

## 2011-04-16 DIAGNOSIS — I5031 Acute diastolic (congestive) heart failure: Secondary | ICD-10-CM | POA: Diagnosis present

## 2011-04-16 DIAGNOSIS — Z91199 Patient's noncompliance with other medical treatment and regimen due to unspecified reason: Secondary | ICD-10-CM

## 2011-04-16 DIAGNOSIS — I509 Heart failure, unspecified: Secondary | ICD-10-CM | POA: Diagnosis present

## 2011-04-16 DIAGNOSIS — G4733 Obstructive sleep apnea (adult) (pediatric): Secondary | ICD-10-CM | POA: Insufficient documentation

## 2011-04-16 DIAGNOSIS — Z951 Presence of aortocoronary bypass graft: Secondary | ICD-10-CM

## 2011-04-16 DIAGNOSIS — J9620 Acute and chronic respiratory failure, unspecified whether with hypoxia or hypercapnia: Principal | ICD-10-CM | POA: Diagnosis present

## 2011-04-16 DIAGNOSIS — E662 Morbid (severe) obesity with alveolar hypoventilation: Secondary | ICD-10-CM | POA: Diagnosis present

## 2011-04-16 DIAGNOSIS — E119 Type 2 diabetes mellitus without complications: Secondary | ICD-10-CM | POA: Diagnosis present

## 2011-04-16 DIAGNOSIS — I129 Hypertensive chronic kidney disease with stage 1 through stage 4 chronic kidney disease, or unspecified chronic kidney disease: Secondary | ICD-10-CM | POA: Diagnosis present

## 2011-04-16 DIAGNOSIS — R82998 Other abnormal findings in urine: Secondary | ICD-10-CM | POA: Diagnosis present

## 2011-04-16 HISTORY — DX: Personal history of colon polyps, unspecified: Z86.0100

## 2011-04-16 HISTORY — DX: Personal history of other diseases of the digestive system: Z87.19

## 2011-04-16 HISTORY — DX: Chronic kidney disease, stage 3 unspecified: N18.30

## 2011-04-16 HISTORY — DX: Male erectile dysfunction, unspecified: N52.9

## 2011-04-16 HISTORY — DX: Other intervertebral disc degeneration, lumbar region without mention of lumbar back pain or lower extremity pain: M51.369

## 2011-04-16 HISTORY — DX: Personal history of diseases of the blood and blood-forming organs and certain disorders involving the immune mechanism: Z86.2

## 2011-04-16 HISTORY — DX: Chronic kidney disease, stage 3 (moderate): N18.3

## 2011-04-16 HISTORY — DX: Other intervertebral disc degeneration, lumbar region: M51.36

## 2011-04-16 HISTORY — DX: Personal history of colonic polyps: Z86.010

## 2011-04-16 HISTORY — DX: Peptic ulcer, site unspecified, unspecified as acute or chronic, without hemorrhage or perforation: K27.9

## 2011-04-16 LAB — COMPREHENSIVE METABOLIC PANEL
ALT: 24 U/L (ref 0–53)
AST: 28 U/L (ref 0–37)
Alkaline Phosphatase: 103 U/L (ref 39–117)
BUN: 48 mg/dL — ABNORMAL HIGH (ref 6–23)
CO2: 31 mEq/L (ref 19–32)
CO2: 29 mEq/L (ref 19–32)
Chloride: 94 mEq/L — ABNORMAL LOW (ref 96–112)
Creatinine, Ser: 3.05 mg/dL — ABNORMAL HIGH (ref 0.50–1.35)
GFR calc Af Amer: 27 mL/min — ABNORMAL LOW (ref >90)
GFR calc non Af Amer: 24 mL/min — ABNORMAL LOW (ref >90)
GFR calc non Af Amer: 19 mL/min — ABNORMAL LOW (ref >90)
Glucose, Bld: 190 mg/dL — ABNORMAL HIGH (ref 70–99)
Potassium: 4.9 mEq/L (ref 3.5–5.1)
Sodium: 140 mEq/L (ref 135–145)
Total Bilirubin: 0.7 mg/dL (ref 0.3–1.2)
Total Protein: 8.3 g/dL (ref 6.0–8.3)

## 2011-04-16 LAB — BLOOD GAS, ARTERIAL
Acid-Base Excess: 1.6 mmol/L (ref 0.0–2.0)
Acid-Base Excess: 1.3 mmol/L (ref 0.0–2.0)
Delivery systems: POSITIVE
Drawn by: 295031
FIO2: 0.4 %
Inspiratory PAP: 12
Inspiratory PAP: 12
Mode: POSITIVE
Mode: POSITIVE
O2 Saturation: 94.1 %
Patient temperature: 98.6
TCO2: 25.5 mmol/L (ref 0–100)
pCO2 arterial: 71.8 mmHg (ref 35.0–45.0)
pO2, Arterial: 80.5 mmHg (ref 80.0–100.0)

## 2011-04-16 LAB — PRO B NATRIURETIC PEPTIDE: Pro B Natriuretic peptide (BNP): 613.7 pg/mL — ABNORMAL HIGH (ref 0–125)

## 2011-04-16 LAB — CBC
HCT: 61.9 % — ABNORMAL HIGH (ref 39.0–52.0)
Hemoglobin: 20.2 g/dL — ABNORMAL HIGH (ref 13.0–17.0)
MCH: 30.8 pg (ref 26.0–34.0)
MCHC: 32.6 g/dL (ref 30.0–36.0)
MCV: 94.5 fL (ref 78.0–100.0)
Platelets: 69 10*3/uL — ABNORMAL LOW (ref 150–400)
RBC: 6.74 MIL/uL — ABNORMAL HIGH (ref 4.22–5.81)
WBC: 13.4 10*3/uL — ABNORMAL HIGH (ref 4.0–10.5)

## 2011-04-16 LAB — CARDIAC PANEL(CRET KIN+CKTOT+MB+TROPI)
Relative Index: 0.6 (ref 0.0–2.5)
Relative Index: 1.1 (ref 0.0–2.5)
Relative Index: 3.3 — ABNORMAL HIGH (ref 0.0–2.5)
Troponin I: 1.39 ng/mL (ref <0.30)

## 2011-04-16 LAB — PROTIME-INR: Prothrombin Time: 14.6 seconds (ref 11.6–15.2)

## 2011-04-16 LAB — URINALYSIS, ROUTINE W REFLEX MICROSCOPIC
Glucose, UA: NEGATIVE mg/dL
Protein, ur: >300 mg/dL — AB
Urobilinogen, UA: 1 mg/dL (ref 0.0–1.0)

## 2011-04-16 LAB — PHOSPHORUS: Phosphorus: 5.1 mg/dL — ABNORMAL HIGH (ref 2.3–4.6)

## 2011-04-16 LAB — DIFFERENTIAL
Lymphocytes Relative: 17 % (ref 12–46)
Lymphs Abs: 2.3 10*3/uL (ref 0.7–4.0)
Neutrophils Relative %: 71 % (ref 43–77)

## 2011-04-16 LAB — URINE MICROSCOPIC-ADD ON

## 2011-04-16 LAB — MAGNESIUM: Magnesium: 1.8 mg/dL (ref 1.5–2.5)

## 2011-04-16 MED ORDER — HEPARIN SODIUM (PORCINE) 5000 UNIT/ML IJ SOLN
5000.0000 [IU] | Freq: Three times a day (TID) | INTRAMUSCULAR | Status: DC
  Administered 2011-04-16: 5000 [IU] via SUBCUTANEOUS
  Filled 2011-04-16 (×6): qty 1

## 2011-04-16 MED ORDER — ALBUTEROL SULFATE (5 MG/ML) 0.5% IN NEBU: 2.5000 mg | INHALATION_SOLUTION | RESPIRATORY_TRACT | Status: DC | PRN

## 2011-04-16 MED ORDER — IPRATROPIUM BROMIDE 0.02 % IN SOLN
0.5000 mg | Freq: Once | RESPIRATORY_TRACT | Status: AC
  Administered 2011-04-16: 0.5 mg via RESPIRATORY_TRACT

## 2011-04-16 MED ORDER — IPRATROPIUM BROMIDE 0.02 % IN SOLN
RESPIRATORY_TRACT | Status: AC
  Filled 2011-04-16: qty 5

## 2011-04-16 MED ORDER — MOXIFLOXACIN HCL IN NACL 400 MG/250ML IV SOLN
400.0000 mg | INTRAVENOUS | Status: DC
Start: 2011-04-16 — End: 2011-04-18
  Administered 2011-04-16 – 2011-04-17 (×2): 400 mg via INTRAVENOUS
  Filled 2011-04-16 (×3): qty 250

## 2011-04-16 MED ORDER — ALBUTEROL (5 MG/ML) CONTINUOUS INHALATION SOLN
10.0000 mg/h | INHALATION_SOLUTION | Freq: Once | RESPIRATORY_TRACT | Status: AC
  Administered 2011-04-16: 10 mg/h via RESPIRATORY_TRACT

## 2011-04-16 MED ORDER — SODIUM CHLORIDE 0.9 % IV BOLUS (SEPSIS)
500.0000 mL | Freq: Once | INTRAVENOUS | Status: AC
  Administered 2011-04-16: 500 mL via INTRAVENOUS

## 2011-04-16 MED ORDER — SODIUM CHLORIDE 0.9 % IV SOLN
Freq: Once | INTRAVENOUS | Status: AC
  Administered 2011-04-16: 10:00:00 via INTRAVENOUS

## 2011-04-16 MED ORDER — SODIUM CHLORIDE 0.9 % IV SOLN
INTRAVENOUS | Status: DC
  Administered 2011-04-16 – 2011-04-17 (×3): via INTRAVENOUS
  Administered 2011-04-18: 10 mL via INTRAVENOUS
  Administered 2011-04-18: 12:00:00 via INTRAVENOUS
  Administered 2011-04-19: 1000 mL via INTRAVENOUS
  Administered 2011-04-20: 18:00:00 via INTRAVENOUS

## 2011-04-16 MED ORDER — ALBUTEROL SULFATE (5 MG/ML) 0.5% IN NEBU
INHALATION_SOLUTION | RESPIRATORY_TRACT | Status: AC
  Filled 2011-04-16: qty 2.5

## 2011-04-16 MED ORDER — ASPIRIN EC 81 MG PO TBEC
81.0000 mg | DELAYED_RELEASE_TABLET | Freq: Every day | ORAL | Status: DC
  Administered 2011-04-16 – 2011-04-21 (×6): 81 mg via ORAL
  Filled 2011-04-16 (×6): qty 1

## 2011-04-16 MED ORDER — MAGNESIUM SULFATE 40 MG/ML IJ SOLN
2.0000 g | Freq: Once | INTRAMUSCULAR | Status: AC
  Administered 2011-04-16: 2 g via INTRAVENOUS
  Filled 2011-04-16: qty 50

## 2011-04-16 MED ORDER — PANTOPRAZOLE SODIUM 40 MG IV SOLR
40.0000 mg | Freq: Every day | INTRAVENOUS | Status: DC
  Administered 2011-04-16: 40 mg via INTRAVENOUS
  Filled 2011-04-16 (×2): qty 40

## 2011-04-16 MED ORDER — METHYLPREDNISOLONE SODIUM SUCC 125 MG IJ SOLR
125.0000 mg | Freq: Once | INTRAMUSCULAR | Status: AC
  Administered 2011-04-16: 125 mg via INTRAVENOUS
  Filled 2011-04-16: qty 2

## 2011-04-16 MED ORDER — IPRATROPIUM BROMIDE 0.02 % IN SOLN
0.5000 mg | Freq: Four times a day (QID) | RESPIRATORY_TRACT | Status: DC
  Administered 2011-04-16 – 2011-04-27 (×42): 0.5 mg via RESPIRATORY_TRACT
  Filled 2011-04-16 (×40): qty 2.5

## 2011-04-16 MED ORDER — ALBUTEROL SULFATE (5 MG/ML) 0.5% IN NEBU
2.5000 mg | INHALATION_SOLUTION | Freq: Four times a day (QID) | RESPIRATORY_TRACT | Status: DC
  Administered 2011-04-16 – 2011-04-17 (×4): 2.5 mg via RESPIRATORY_TRACT
  Filled 2011-04-16 (×4): qty 0.5

## 2011-04-16 NOTE — ED Notes (Signed)
2 sets of blood cultures drawn and sent to the lab.

## 2011-04-16 NOTE — ED Notes (Signed)
Pt. given urinal; advised needed a urine sample. Was unable to void. Will revisit in 10 mins.

## 2011-04-16 NOTE — Progress Notes (Signed)
Encouraged pt to try voiding, pt unable at this time.

## 2011-04-16 NOTE — ED Notes (Signed)
ZOX:WRUE<AV> Expected date:<BR> Expected time:<BR> Means of arrival:<BR> Comments:<BR> Patient from triage

## 2011-04-16 NOTE — ED Provider Notes (Signed)
History     CSN: [id number]  Arrival date & time 04/16/11  4010   First MD Initiated Contact with Patient 04/16/11 [phone]      No chief complaint on file.   (Consider location/radiation/quality/duration/timing/severity/associated sxs/prior treatment) The history is provided by the patient.   Pt presents with SOB x 2 days with wheezing. Denies CP, fever, lower ext swelling or pain. History limited due to pt distress. No prev intubations for COPD exacerbation Past Medical History  Diagnosis Date  . Renal insufficiency   . Diabetes mellitus   . GERD (gastroesophageal reflux disease)   . Gastroparesis   . Esophageal stricture   . Hyperlipemia   . Hypertension   . Pancreatitis   . Prostate cancer   . Chronic respiratory disease   . Depression   . COPD (chronic obstructive pulmonary disease)   . Seizure disorder   . Arthritis   . Diverticulosis   . Sleep apnea     CPAP Machine  . Obesity   . ED (erectile dysfunction)     Past Surgical History  Procedure Date  . Coronary artery bypass graft   . Prostate surgery   . Back surgery     lumbar    Family History  Problem Relation Age of Onset  . Colon cancer Neg Hx     History  Substance Use Topics  . Smoking status: Current Everyday Smoker  . Smokeless tobacco: Not on file  . Alcohol Use: No      Review of Systems  Constitutional: Negative for fever and chills.  Respiratory: Positive for cough, shortness of breath and wheezing.   Cardiovascular: Negative for chest pain.  Gastrointestinal: Negative for nausea, vomiting and abdominal pain.  Neurological: Negative for weakness.    Allergies  Review of patient's allergies indicates no known allergies.  Home Medications   Current Outpatient Rx  Name Route Sig Dispense Refill  . ALLOPURINOL 100 MG PO TABS Oral Take 1 tablet (100 mg total) by mouth 2 (two) times daily. 60 tablet 11  . ASPIRIN 81 MG PO TABS Oral Take 81 mg by mouth daily.      . COLCHICINE 0.6  MG PO TABS Oral Take 1 tablet (0.6 mg total) by mouth daily. 30 tablet 11  . FLUTICASONE PROPIONATE 50 MCG/ACT NA SUSP Nasal Place 2 sprays into the nose daily. 1 g 11  . FLUTICASONE-SALMETEROL 250-50 MCG/DOSE IN AEPB Inhalation Inhale 1 puff into the lungs 2 (two) times daily. One puff two times daily 1 each 11  . FUROSEMIDE 40 MG PO TABS  Take 2 tablets in the morning and one in the evening 90 tablet 11  . GLIMEPIRIDE 2 MG PO TABS Oral Take 1 tablet (2 mg total) by mouth 2 (two) times daily. 60 tablet 5  . INSULIN GLARGINE 100 UNIT/ML [place] SOLN Subcutaneous Inject 15 Units into the skin 2 (two) times daily. 10 mL 11  . INSULIN GLULISINE 100 UNIT/ML IJ SOLN Subcutaneous Inject 20 Units into the skin 3 (three) times daily before meals.      Marland Kitchen LISINOPRIL 20 MG PO TABS Oral Take 1 tablet (20 mg total) by mouth daily. 30 tablet 11  . MAGNESIUM 30 MG PO TABS Oral Take 30 mg by mouth as needed.      Marland Kitchen PANTOPRAZOLE SODIUM 40 MG PO TBEC Oral Take 1 tablet (40 mg total) by mouth daily. 30 tablet 11  . PARICALCITOL 2 MCG PO CAPS Oral Take 1 capsule (2 mcg total)  by mouth every other day. 15 capsule 11  . SIMVASTATIN 40 MG PO TABS Oral Take 1 tablet (40 mg total) by mouth at bedtime. Once daily 30 tablet 11    There were no vitals taken for this visit.  Physical Exam  Nursing note and vitals reviewed. Constitutional: He is oriented to person, place, and time. He appears well-developed and well-nourished. He appears distressed (resp distress. speaking in short sentences).  HENT:  Head: Normocephalic and atraumatic.  Mouth/Throat: Oropharynx is clear and moist.  Eyes: EOM are normal. Pupils are equal, round, and reactive to light.  Neck: Normal range of motion. Neck supple.  Cardiovascular: Regular rhythm.        tachycardia  Pulmonary/Chest: He is in respiratory distress. He has wheezes. He has no rales.       Decreased air movement and diffuse wheezes. Increased effort  Abdominal: Soft. Bowel sounds  are normal. He exhibits distension. There is no tenderness. There is no rebound and no guarding.  Musculoskeletal: Normal range of motion. He exhibits no edema and no tenderness.       No calf tenderness or edema  Neurological: He is alert and oriented to person, place, and time.       Moves all ext.   Skin: Skin is warm and dry. No rash noted. No erythema.  Psychiatric: He has a normal mood and affect. His behavior is normal.    ED Course  Procedures (including critical care time)   Labs Reviewed  CBC  DIFFERENTIAL  COMPREHENSIVE METABOLIC PANEL  CARDIAC PANEL(CRET KIN+CKTOT+MB+TROPI)  BLOOD GAS, ARTERIAL   No results found.   No diagnosis found.   Date: 04/16/2011  Rate: 145  Rhythm: sinus tachycardia  QRS Axis: normal  Intervals: normal  ST/T Wave abnormalities: normal  Conduction Disutrbances:none  Narrative Interpretation:   Old EKG Reviewed: tachycadia compared to old     MDM   Pt states he is breathing much better. RR dropped into 30's. Appears more comfortable. Will consult intensivist for possible ICU admission       Loren Racer, MD 04/17/11 1441

## 2011-04-16 NOTE — ED Notes (Signed)
Esther in lab called and reported that coag studies will need to be redrawn in a special tube and will send the tube.  Called Sue Lush in ICU to give that update.

## 2011-04-16 NOTE — Progress Notes (Signed)
Elink MD notified of pt's inability to void and bladder scan amount of , New order given for 500cc NS bolus.

## 2011-04-16 NOTE — H&P (Signed)
Name: [name] [name] MRN: [id number] DOB: [birth year]    LOS: 0 Requesting MD: Dr. Ranae Palms EDP  PCCM ADMISSION  NOTE- 1st hour  Brief patient profile:  71 yobm  to [facility] EDP  2/15 with 2 days of increasing DOE/SOB without fevers, chills, sweats or sputum production.  He is a life long smoker of 2 ppd of cigarettes. He denies etoh or drug use. Placed on NIMVS for hypoxic/hypercarbic resp failure.  PCCM asked to admit  Lines / Drains: none  Micro/sepsis: 2/15 bc x 2>> 2/15 uc>> 2/15 SPUTUM (IF OBTAINABLE)>> Urine Strep ag 2/15 > neg  Antibiotics: 2/15 Avelox(aecodp)>>   Tests / Events:     Past Medical History  Diagnosis Date  . Renal insufficiency   . Diabetes mellitus   . GERD (gastroesophageal reflux disease)   . Gastroparesis   . Esophageal stricture   . Hyperlipemia   . Hypertension   . Pancreatitis   . Prostate cancer   . Chronic respiratory disease   . Depression   . COPD (chronic obstructive pulmonary disease)   . Seizure disorder   . Arthritis   . Diverticulosis   . Sleep apnea     CPAP Machine  . Obesity   . ED (erectile dysfunction)    Past Surgical History  Procedure Date  . Coronary artery bypass graft   . Prostate surgery   . Back surgery     lumbar   Prior to Admission medications   Medication Sig Start Date End Date Taking? Authorizing Provider  allopurinol (ZYLOPRIM) 100 MG tablet Take 1 tablet (100 mg total) by mouth 2 (two) times daily. 12/28/10   Carollee Herter, MD  aspirin 81 MG tablet Take 81 mg by mouth daily.      Historical Provider, MD  colchicine 0.6 MG tablet Take 1 tablet (0.6 mg total) by mouth daily. 12/28/10   Carollee Herter, MD  fluticasone [facility]) 50 MCG/ACT nasal spray Place 2 sprays into the nose daily. 12/28/10   Carollee Herter, MD  Fluticasone-Salmeterol (ADVAIR DISKUS) 250-50 MCG/DOSE AEPB Inhale 1 puff into the lungs 2 (two) times daily. One puff two times daily 12/28/10   Carollee Herter, MD    furosemide (LASIX) 40 MG tablet Take 2 tablets in the morning and one in the evening 02/04/11 02/04/12  Carollee Herter, MD  glimepiride (AMARYL) 2 MG tablet Take 1 tablet (2 mg total) by mouth 2 (two) times daily. 12/30/10 12/30/11  Carollee Herter, MD  insulin glargine (LANTUS) 100 UNIT/ML injection Inject 15 Units into the skin 2 (two) times daily. 12/28/10   Carollee Herter, MD  insulin glulisine (APIDRA) 100 UNIT/ML injection Inject 20 Units into the skin 3 (three) times daily before meals.   12/28/10   Carollee Herter, MD  lisinopril (PRINIVIL,ZESTRIL) 20 MG tablet Take 1 tablet (20 mg total) by mouth daily. 12/28/10   Carollee Herter, MD  magnesium 30 MG tablet Take 30 mg by mouth as needed.      Historical Provider, MD  pantoprazole (PROTONIX) 40 MG tablet Take 1 tablet (40 mg total) by mouth daily. 12/28/10   Carollee Herter, MD  paricalcitol (ZEMPLAR) 2 MCG capsule Take 1 capsule (2 mcg total) by mouth every other day. 12/30/10 12/30/11  Carollee Herter, MD  simvastatin (ZOCOR) 40 MG tablet Take 1 tablet (40 mg total) by mouth at bedtime. Once daily 12/28/10   Carollee Herter, MD   Allergies No Known Allergies  Family History Family History  Problem Relation Age of Onset  . Colon cancer Neg Hx     Social History  reports that he has been smoking.  He does not have any smokeless tobacco history on file. He reports that he does not drink alcohol or use illicit drugs.  Review Of Systems  11 points review of systems is negative with an exception of listed in HPI.  Vital Signs: Pulse Rate:  [129-148] 129  (02/15 1110) Resp:  [23-49] 23  (02/15 1110) BP: (142-144)/(78-94) 144/94 mmHg (02/15 1110) SpO2:  [76 %-99 %] 99 % (02/15 1110) FiO2 (%):  [40 %] 40 % (02/15 1110) Weight:  [270 lb (122.471 kg)] 270 lb (122.471 kg) (02/15 1005)    Physical Examination: General:  MO AAM on NIMVS Neuro:  intact   HEENT:  Short neck, no  jvd Cardiovascular:  hsd rrr Lungs:  +exp wheeze, increased wob, +accesory muscle use. Abdomen:  Obese, + bs Musculoskeletal:  Intact Skin:  Warm +lower ext edema  Ventilator settings: Vent Mode:  [-]  FiO2 (%):  [40 %] 40 %  Labs and Imaging:  Dg Chest Port 1 View  04/16/2011  *RADIOLOGY REPORT*  Clinical Data: Shortness of breath, COPD.  PORTABLE CHEST - 1 VIEW  Comparison: 04/09/2010  Findings: Heart is upper limits normal in size.  Lungs are clear. No effusions, edema or acute bony abnormality.  IMPRESSION: No active cardiopulmonary disease.  Original Report Authenticated By: Cyndie Chime, M.D.    Lab 04/16/11 0950  NA 140  K 4.9  CL 94*  CO2 31  BUN 47*  CREATININE 2.53*  GLUCOSE 190*    Lab 04/16/11 0950  HGB 21.0*  HCT 63.5*  WBC 13.4*  PLT 69*   ABG    Component Value Date/Time   PHART 7.272* 04/16/2011 1018   PCO2ART 71.8* 04/16/2011 1018   PO2ART 139.0* 04/16/2011 1018   HCO3 32.0* 04/16/2011 1018   TCO2 26.9 04/16/2011 1018   O2SAT 98.1 04/16/2011 1018    Assessment and Plan: Hypoxic, 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 Volume overload. -admit to pccm icu -nimvs if no response will intubate - bd -steroids -mag sulfate -check bnp/ce for completeness -uds -cpap nocturnal -D/C ACEI  CRI base creatine 2.05 Monitor Lab Results  Component Value Date   CREATININE 2.53* 04/16/2011   CREATININE 2.2* 05/22/2010   CREATININE 2.05* 04/10/2010   D/C ACEI  DM -ssi  SZ DO -cont home meds  Thrombocytopenia ? Cause Lab Results  Component Value Date   PLT 46* 04/16/2011   PLT 69* 04/16/2011   PLT 93* 04/10/2010   PLT 57* 06/09/2009   PLT 53* 04/14/2009   PLT 73* 01/01/2009      Best practices / Disposition: -->ICU status under PCCM -->full code -->PAS DVT Px (low plt) -->Protonix for GI Px      Brett Canales Minor ACNP Adolph Pollack PCCM Pager [phone] till 3 pm If no answer page [phone] 04/16/2011, 12:37  PM  Pt independently  seen and examined and available cxr's reviewed and I agree with above findings/ imp/ plan  The patient is critically ill with multiple organ systems failure and requires high complexity decision making for assessment and support, frequent evaluation and titration of therapies, application of advanced monitoring technologies and extensive interpretation of multiple databases. Critical Care Time devoted to patient care services described in this note is 45 minutes.   Sandrea Hughs, MD Pulmonary  and Critical Care Medicine [facility] Cell [phone]

## 2011-04-16 NOTE — Progress Notes (Signed)
ANTIBIOTIC CONSULT NOTE - INITIAL  Pharmacy Consult for Avelox Indication: pneumonia  No Known Allergies  Patient Measurements: Height: 5\' 8"  (172.7 cm) Weight: 270 lb (122.471 kg) IBW/kg (Calculated) : 68.4   Vital Signs: BP: 112/63 mmHg (02/15 1320) Pulse Rate: 126  (02/15 1320)  Labs:  Basename 04/16/11 0950  WBC 13.4*  HGB 21.0*  PLT 69*  LABCREA --  CREATININE 2.53*   Estimated Creatinine Clearance: 33.1 ml/min (by C-G formula based on Cr of 2.53).  Microbiology: No results found for this or any previous visit (from the past 720 hour(s)).  Medical History: Past Medical History  Diagnosis Date  . Renal insufficiency   . Diabetes mellitus   . GERD (gastroesophageal reflux disease)   . Gastroparesis   . Esophageal stricture   . Hyperlipemia   . Hypertension   . Pancreatitis   . Chronic respiratory disease   . Depression   . Seizure disorder   . Arthritis   . Diverticulosis   . Sleep apnea     CPAP Machine  . Obesity   . ED (erectile dysfunction)   . Sleep apnea   . COPD (chronic obstructive pulmonary disease)     Gold Stage  III/IV  . H/O polycythemia   . Chronic kidney disease (CKD), stage III (moderate)   . DDD (degenerative disc disease), lumbar   . History of esophageal stricture   . Erectile dysfunction   . Prostate cancer   . Peptic ulcer disease   . History of colon polyps   . Gout   . Gastroparesis     Assessment: 71YOM life long smoker presents with symptoms consistent with PNA.   Plan:  Avelox 400mg  IV q24h.  Clance Boll 04/16/2011,1:45 PM

## 2011-04-16 NOTE — ED Notes (Signed)
Pt reports sob started 2 days ago. Hx of copd. Denies chest pain. Pt very sob on arrival.

## 2011-04-16 NOTE — Progress Notes (Signed)
Name: FAYETTE HAMADA MRN: [id number] DOB: 09/29/1940  ELECTRONIC ICU PHYSICIAN NOTE  Problem:  Contacted by RN re: elevated troponin.  No chest pain. Suspect demand ischemia, less likely AMI.  Intervention:  ASA, 12-lead EKG, continue trending cardiac enzymes.  Will defer Heparin as thrombocytopenic. Will defer beta-blockers as AECOPD / bronchospasm.  Orlean Bradford, M.D. Pulmonary and Critical Care Medicine [facility] Cell: [phone] Pager: [phone]  04/16/2011, 9:54 PM

## 2011-04-17 ENCOUNTER — Inpatient Hospital Stay (HOSPITAL_COMMUNITY): Payer: Medicare Other

## 2011-04-17 DIAGNOSIS — N179 Acute kidney failure, unspecified: Secondary | ICD-10-CM

## 2011-04-17 LAB — GLUCOSE, CAPILLARY
Glucose-Capillary: 158 mg/dL — ABNORMAL HIGH (ref 70–99)
Glucose-Capillary: 168 mg/dL — ABNORMAL HIGH (ref 70–99)
Glucose-Capillary: 265 mg/dL — ABNORMAL HIGH (ref 70–99)

## 2011-04-17 LAB — CBC
MCH: 30.3 pg (ref 26.0–34.0)
MCV: 95 fL (ref 78.0–100.0)
Platelets: 64 10*3/uL — ABNORMAL LOW (ref 150–400)
RDW: 14.7 % (ref 11.5–15.5)

## 2011-04-17 LAB — CARDIAC PANEL(CRET KIN+CKTOT+MB+TROPI)
CK, MB: 12.7 ng/mL (ref 0.3–4.0)
CK, MB: 10.6 ng/mL (ref 0.3–4.0)
Relative Index: 4.2 — ABNORMAL HIGH (ref 0.0–2.5)
Relative Index: 3 — ABNORMAL HIGH (ref 0.0–2.5)
Total CK: 276 U/L — ABNORMAL HIGH (ref 7–232)
Total CK: 286 U/L — ABNORMAL HIGH (ref 7–232)
Total CK: 351 U/L — ABNORMAL HIGH (ref 7–232)
Troponin I: 1.72 ng/mL (ref <0.30)

## 2011-04-17 LAB — BASIC METABOLIC PANEL
CO2: 31 mEq/L (ref 19–32)
Calcium: 8.9 mg/dL (ref 8.4–10.5)
Creatinine, Ser: 3.41 mg/dL — ABNORMAL HIGH (ref 0.50–1.35)
GFR calc Af Amer: 19 mL/min — ABNORMAL LOW (ref >90)
Sodium: 138 mEq/L (ref 135–145)

## 2011-04-17 LAB — MRSA PCR SCREENING: MRSA by PCR: NEGATIVE

## 2011-04-17 MED ORDER — LEVALBUTEROL HCL 0.63 MG/3ML IN NEBU
0.6300 mg | INHALATION_SOLUTION | Freq: Four times a day (QID) | RESPIRATORY_TRACT | Status: DC
  Administered 2011-04-17 – 2011-04-27 (×38): 0.63 mg via RESPIRATORY_TRACT
  Filled 2011-04-17 (×50): qty 3

## 2011-04-17 MED ORDER — INSULIN ASPART 100 UNIT/ML ~~LOC~~ SOLN
0.0000 [IU] | Freq: Three times a day (TID) | SUBCUTANEOUS | Status: DC
  Administered 2011-04-17 – 2011-04-18 (×2): 8 [IU] via SUBCUTANEOUS
  Administered 2011-04-18: 3 [IU] via SUBCUTANEOUS
  Administered 2011-04-18: 8 [IU] via SUBCUTANEOUS
  Administered 2011-04-19 (×2): 5 [IU] via SUBCUTANEOUS
  Administered 2011-04-19: 3 [IU] via SUBCUTANEOUS
  Administered 2011-04-20 (×3): 8 [IU] via SUBCUTANEOUS
  Administered 2011-04-21: 5 [IU] via SUBCUTANEOUS
  Administered 2011-04-21: 15 [IU] via SUBCUTANEOUS
  Administered 2011-04-21 – 2011-04-22 (×2): 8 [IU] via SUBCUTANEOUS
  Administered 2011-04-22 (×2): 5 [IU] via SUBCUTANEOUS
  Administered 2011-04-23: 8 [IU] via SUBCUTANEOUS
  Administered 2011-04-23: 2 [IU] via SUBCUTANEOUS
  Administered 2011-04-23: 11 [IU] via SUBCUTANEOUS
  Administered 2011-04-24: 5 [IU] via SUBCUTANEOUS
  Administered 2011-04-24: 2 [IU] via SUBCUTANEOUS
  Administered 2011-04-24 – 2011-04-25 (×3): 8 [IU] via SUBCUTANEOUS
  Administered 2011-04-25: 3 [IU] via SUBCUTANEOUS
  Administered 2011-04-26: 5 [IU] via SUBCUTANEOUS
  Administered 2011-04-26 (×2): 11 [IU] via SUBCUTANEOUS
  Administered 2011-04-27: 5 [IU] via SUBCUTANEOUS
  Administered 2011-04-27: 15 [IU] via SUBCUTANEOUS
  Administered 2011-04-27: 5 [IU] via SUBCUTANEOUS
  Administered 2011-04-28: 8 [IU] via SUBCUTANEOUS
  Administered 2011-04-28: 15 [IU] via SUBCUTANEOUS

## 2011-04-17 MED ORDER — INSULIN ASPART 100 UNIT/ML ~~LOC~~ SOLN
0.0000 [IU] | Freq: Three times a day (TID) | SUBCUTANEOUS | Status: DC
  Administered 2011-04-17: 3 [IU] via SUBCUTANEOUS
  Filled 2011-04-17: qty 3

## 2011-04-17 MED ORDER — INSULIN ASPART 100 UNIT/ML ~~LOC~~ SOLN
0.0000 [IU] | Freq: Every day | SUBCUTANEOUS | Status: DC
  Administered 2011-04-17 – 2011-04-19 (×3): 3 [IU] via SUBCUTANEOUS
  Administered 2011-04-20: 4 [IU] via SUBCUTANEOUS
  Administered 2011-04-21: 2 [IU] via SUBCUTANEOUS
  Administered 2011-04-22 – 2011-04-26 (×5): 3 [IU] via SUBCUTANEOUS
  Administered 2011-04-27: 5 [IU] via SUBCUTANEOUS
  Filled 2011-04-17: qty 3

## 2011-04-17 MED ORDER — INSULIN ASPART 100 UNIT/ML ~~LOC~~ SOLN: 0.0000 [IU] | Freq: Three times a day (TID) | SUBCUTANEOUS | Status: DC

## 2011-04-17 MED ORDER — INSULIN GLARGINE 100 UNIT/ML ~~LOC~~ SOLN
10.0000 [IU] | Freq: Every day | SUBCUTANEOUS | Status: DC
  Administered 2011-04-17 – 2011-04-18 (×2): 10 [IU] via SUBCUTANEOUS
  Filled 2011-04-17: qty 3

## 2011-04-17 MED ORDER — BIOTENE DRY MOUTH MT LIQD
15.0000 mL | Freq: Two times a day (BID) | OROMUCOSAL | Status: DC
  Administered 2011-04-17 – 2011-04-23 (×12): 15 mL via OROMUCOSAL

## 2011-04-17 MED ORDER — BISOPROLOL FUMARATE 5 MG PO TABS
2.5000 mg | ORAL_TABLET | Freq: Every day | ORAL | Status: DC
  Administered 2011-04-18: 2.5 mg via ORAL
  Filled 2011-04-17 (×2): qty 0.5

## 2011-04-17 MED ORDER — DEXTROSE 10 % IV SOLN: INTRAVENOUS | Status: DC

## 2011-04-17 MED ORDER — METHYLPREDNISOLONE SODIUM SUCC 125 MG IJ SOLR
80.0000 mg | Freq: Three times a day (TID) | INTRAMUSCULAR | Status: DC
  Administered 2011-04-17 – 2011-04-18 (×3): 80 mg via INTRAVENOUS
  Filled 2011-04-17 (×3): qty 2
  Filled 2011-04-17 (×3): qty 1.28

## 2011-04-17 MED ORDER — PANTOPRAZOLE SODIUM 40 MG PO TBEC
40.0000 mg | DELAYED_RELEASE_TABLET | Freq: Two times a day (BID) | ORAL | Status: DC
  Administered 2011-04-17 – 2011-04-28 (×22): 40 mg via ORAL
  Filled 2011-04-17 (×24): qty 1

## 2011-04-17 NOTE — Progress Notes (Signed)
CRITICAL VALUE ALERT  Critical value received: CKMB 10.4, Trop. 1.39  Date of notification:  04/16/11  Time of notification:  2148  Critical value read back:yes  Nurse who received alert:  T. Joseph Art, RN   MD notified (1st page):  Dr. Liz Beach MD  Time of first page:  2154  MD notified (2nd page):  Time of second page:  Responding MD:  Dr. Herma Carson  Time MD responded:  2155

## 2011-04-17 NOTE — Progress Notes (Signed)
Name: [name] [name] MRN: [id number] DOB: 20-Jun-1940    LOS: 1 Requesting MD: Dr. Ranae Palms EDP Primary = [name]  PCCM Progress Note   Brief patient profile:  21 yobm  Active smoker on ACEI to [facility] EDP  2/15 with 2 days of increasing DOE/SOB without fevers, chills, sweats or sputum production.  Placed on NIMVS for hypoxic/hypercarbic resp failure.  PCCM asked to admit  Lines / Drains: none  Micro/sepsis: MRSA screen > neg 2/15 bc x 2>> 2/15 uc>> 2/15 SPUTUM  >> Urine Strep ag 2/15 > neg Procalcitonin 2/15   0.36  Antibiotics: 2/15 Avelox(aecodp)>>   Tests / Events: Trop 2.0 2/15 > added bisoprolol 2.5 mg daily am 2/16 plus asa     Vital Signs: BP 101/50  Pulse 95  Temp(Src) 97.6 F (36.4 C) (Oral)  Resp 13  Ht 5\' 9"  (1.753 m)  Wt 257 lb 8 oz (116.8 kg)  BMI 38.03 kg/m2  SpO2 91%    Intake/Output Summary (Last 24 hours) at 04/17/11 4098 Last data filed at 04/17/11 0800  Gross per 24 hour  Intake   1110 ml  Output    300 ml  Net    810 ml     Physical Examination: General:  MO AAM  nad flat Neuro:  intact   HEENT:  Short neck, no jvd Cardiovascular:  hsd rrr Lungs:  +exp wheeze, increased wob, +accesory muscle use. Abdomen:  Obese, + bs Musculoskeletal:  Intact Skin:  Warm +lower ext edema  Ventilator settings: Vent Mode:  [-]  FiO2 (%):  [4 %-50 %] 4 %  Labs and Imaging:  Portable Chest Xray In Am  04/17/2011   IMPRESSION: No acute process identified.        Lab 04/17/11 0251 04/16/11 1320 04/16/11 0950  NA 138 138 140  K 5.5* 5.0 4.9  CL 98 94* 94*  CO2 31 29 31   BUN 63* 48* 47*  CREATININE 3.41* 3.05* 2.53*  GLUCOSE 210* 212* 190*    Lab 04/17/11 0251 04/16/11 1320 04/16/11 0950  HGB 18.3* 20.2* 21.0*  HCT 57.4* 61.9* 63.5*  WBC 12.1* 13.7* 13.4*  PLT 64* 46* 69*   ABG    Component Value Date/Time   PHART 7.292* 04/16/2011 1330   PCO2ART 65.4* 04/16/2011 1330   PO2ART 80.5 04/16/2011 1330   HCO3 30.6* 04/16/2011 1330   TCO2  25.5 04/16/2011 1330   O2SAT 94.1 04/16/2011 1330    Assessment and Plan: Hypoxic, 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 Volume overload on ACEI - changed bd to xopenex 2/16 due to pos trop -steroids -uds  D/C ACEI 2/125  CRI base creatine 2.05 Monitor Lab Results  Component Value Date   CREATININE 3.41* 04/17/2011   CREATININE 3.05* 04/16/2011   CREATININE 2.53* 04/16/2011   D/C ACEI 2/15 Keep tank full as long as no pulmonary edema  DM -ssi  SZ DO -cont home meds  Thrombocytopenia ? Cause/ chronic Lab Results  Component Value Date   PLT 64* 04/17/2011   PLT 46* 04/16/2011   PLT 69* 04/16/2011   PLT 57* 06/09/2009   PLT 53* 04/14/2009   PLT 73* 01/01/2009   should be able to tol low dose asa on ppi bid  Positive cardiac enzymes No clinical ischemia, prob secondary to stress Rx asa/ bisoprolol if bp tolerates -change resp rx to [employer] / Disposition: -->ICU status under PCCM -->full code -->PAS DVT Px (low  plt) -->Protonix for GI Px     The patient is critically ill with multiple organ systems failure and requires high complexity decision making for assessment and support, frequent evaluation and titration of therapies, application of advanced monitoring technologies and extensive interpretation of multiple databases. Critical Care Time devoted to patient care services described in this note is 45 minutes.   Sandrea Hughs, MD Pulmonary and Critical Care Medicine [facility] Cell [phone]

## 2011-04-18 DIAGNOSIS — J441 Chronic obstructive pulmonary disease with (acute) exacerbation: Secondary | ICD-10-CM

## 2011-04-18 DIAGNOSIS — N179 Acute kidney failure, unspecified: Secondary | ICD-10-CM

## 2011-04-18 DIAGNOSIS — J9600 Acute respiratory failure, unspecified whether with hypoxia or hypercapnia: Secondary | ICD-10-CM

## 2011-04-18 LAB — LEGIONELLA ANTIGEN, URINE: Legionella Antigen, Urine: NEGATIVE

## 2011-04-18 LAB — GLUCOSE, CAPILLARY

## 2011-04-18 MED ORDER — MOXIFLOXACIN HCL 400 MG PO TABS
400.0000 mg | ORAL_TABLET | Freq: Every day | ORAL | Status: DC
  Administered 2011-04-18: 400 mg via ORAL
  Filled 2011-04-18 (×2): qty 1

## 2011-04-18 MED ORDER — BISOPROLOL FUMARATE 5 MG PO TABS
5.0000 mg | ORAL_TABLET | Freq: Every day | ORAL | Status: DC
  Administered 2011-04-19: 5 mg via ORAL
  Filled 2011-04-18: qty 1

## 2011-04-18 MED ORDER — METHYLPREDNISOLONE SODIUM SUCC 125 MG IJ SOLR
80.0000 mg | Freq: Two times a day (BID) | INTRAMUSCULAR | Status: DC
  Administered 2011-04-18 – 2011-04-20 (×3): 80 mg via INTRAVENOUS
  Filled 2011-04-18 (×4): qty 1.28

## 2011-04-18 MED ORDER — FUROSEMIDE 10 MG/ML IJ SOLN
80.0000 mg | Freq: Once | INTRAMUSCULAR | Status: AC
  Administered 2011-04-18: 80 mg via INTRAVENOUS
  Filled 2011-04-18: qty 8

## 2011-04-18 NOTE — Progress Notes (Signed)
ANTIBIOTIC CONSULT NOTE  Pharmacy Consult for Avelox Indication: pneumonia  Vital Signs: Temp: 98.4 F (36.9 C) (02/17 0800) Temp src: Oral (02/17 0800) BP: 116/60 mmHg (02/17 0600) Pulse Rate: 85  (02/17 0600)  Labs:  Basename 04/17/11 0251 04/16/11 1320 04/16/11 0950  WBC 12.1* 13.7* 13.4*  HGB 18.3* 20.2* 21.0*  PLT 64* 46* 69*  LABCREA -- -- --  CREATININE 3.41* 3.05* 2.53*   Cultures: 2/15 Blood: ngtd 2/16 Sputum: pending 2/15 MRSA PCR negative  Assessment: 71YOM life long smoker presents with symptoms consistent with PNA on day #3/x Avelox.  Considering patient is improving clinically (afebrile > 48 hours) and is able to take meds by mouth, will switch Avelox to PO.  Plan:  Avelox 400mg  PO q24h.  Clance Boll 04/18/2011,9:17 AM

## 2011-04-18 NOTE — Progress Notes (Signed)
Name: [name] [name] MRN: [id number] DOB: Jul 07, 1940    LOS: 2 Requesting MD: Dr. Ranae Palms EDP Primary = [name]  PCCM Progress Note   Brief patient profile:  72 yobm  Active smoker on ACEI to [facility] EDP  2/15 with 2 days of increasing DOE/SOB without fevers, chills, sweats or sputum production.  Placed on NIMVS for hypoxic/hypercarbic resp failure.  PCCM asked to admit    Micro/sepsis: MRSA screen > neg 2/15 bc x 2>> 2/15 uc>> 2/15 SPUTUM  >> Urine Strep ag 2/15 > neg Procalcitonin 2/15   0.36  Antibiotics: 2/15 Avelox(aecodp)>>   Tests / Events: Trop 2.0 2/15 > added bisoprolol 2.5 mg daily am 2/16 plus asa  Overnight Only used cpap 4 hours - says using it more makes him worse esp his home machine- overall improving   Vital Signs: BP 130/71  Pulse 98  Temp(Src) 98.4 F (36.9 C) (Oral)  Resp 20  Ht 5\' 9"  (1.753 m)  Wt 257 lb 8 oz (116.8 kg)  BMI 38.03 kg/m2  SpO2 91%    Intake/Output Summary (Last 24 hours) at 04/18/11 1109 Last data filed at 04/18/11 1000  Gross per 24 hour  Intake   3050 ml  Output   1435 ml  Net   1615 ml     Physical Examination: General:  MO AAM  nad flat Neuro:  intact   HEENT:  Short neck, no jvd Cardiovascular:  hsd rrr Lungs:  +exp wheeze, increased wob, +accesory muscle use. Abdomen:  Obese, + bs Musculoskeletal:  Intact Skin:  Warm +lower ext edema     Labs and Imaging:  Portable Chest Xray In Am  04/17/2011   IMPRESSION: No acute process identified.        Lab 04/17/11 0251 04/16/11 1320 04/16/11 0950  NA 138 138 140  K 5.5* 5.0 4.9  CL 98 94* 94*  CO2 31 29 31   BUN 63* 48* 47*  CREATININE 3.41* 3.05* 2.53*  GLUCOSE 210* 212* 190*    Lab 04/17/11 0251 04/16/11 1320 04/16/11 0950  HGB 18.3* 20.2* 21.0*  HCT 57.4* 61.9* 63.5*  WBC 12.1* 13.7* 13.4*  PLT 64* 46* 69*   ABG    Component Value Date/Time   PHART 7.292* 04/16/2011 1330   PCO2ART 65.4* 04/16/2011 1330   PO2ART 80.5 04/16/2011 1330   HCO3 30.6*  04/16/2011 1330   TCO2 25.5 04/16/2011 1330   O2SAT 94.1 04/16/2011 1330    Assessment and Plan: Hypoxic, 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 Volume overload on ACEI - changed bd to xopenex 2/16 due to pos trop -steroids -uds  D/C ACEI 2/125  CRI base creatine 2.05 Monitor Lab Results  Component Value Date   CREATININE 3.41* 04/17/2011   CREATININE 3.05* 04/16/2011   CREATININE 2.53* 04/16/2011   D/C ACEI 2/15 Keep tank full as long as no pulmonary edema  DM -ssi  SZ DO -cont home meds  Thrombocytopenia ? Cause/ chronic Lab Results  Component Value Date   PLT 64* 04/17/2011   PLT 46* 04/16/2011   PLT 69* 04/16/2011   PLT 57* 06/09/2009   PLT 53* 04/14/2009   PLT 73* 01/01/2009   should be able to tol low dose asa on ppi bid  Positive cardiac enzymes No clinical ischemia, prob secondary to stress Rx asa/ bisoprolol if bp tolerates> titrated to 5 mg bid 2/17 -change resp rx to xopenex  Best practices / Disposition: -->ICU-SD status under  PCCM -->full code -->PAS DVT Px (low plt) -->Protonix for GI Px       Sandrea Hughs, MD Pulmonary and Critical Care Medicine [facility] Cell [phone]

## 2011-04-19 LAB — BASIC METABOLIC PANEL
CO2: 30 mEq/L (ref 19–32)
Chloride: 100 mEq/L (ref 96–112)
Glucose, Bld: 291 mg/dL — ABNORMAL HIGH (ref 70–99)
Potassium: 5.9 mEq/L — ABNORMAL HIGH (ref 3.5–5.1)
Sodium: 137 mEq/L (ref 135–145)

## 2011-04-19 LAB — CULTURE, RESPIRATORY W GRAM STAIN: Culture: NORMAL

## 2011-04-19 LAB — CBC
Hemoglobin: 18.1 g/dL — ABNORMAL HIGH (ref 13.0–17.0)
MCH: 30.6 pg (ref 26.0–34.0)
RBC: 5.91 MIL/uL — ABNORMAL HIGH (ref 4.22–5.81)

## 2011-04-19 LAB — GLUCOSE, CAPILLARY
Glucose-Capillary: 213 mg/dL — ABNORMAL HIGH (ref 70–99)
Glucose-Capillary: 293 mg/dL — ABNORMAL HIGH (ref 70–99)

## 2011-04-19 LAB — APTT: aPTT: 39 seconds — ABNORMAL HIGH (ref 24–37)

## 2011-04-19 LAB — PROTIME-INR: Prothrombin Time: 14.4 seconds (ref 11.6–15.2)

## 2011-04-19 MED ORDER — CEFTRIAXONE SODIUM 1 G IJ SOLR
1.0000 g | INTRAMUSCULAR | Status: DC
  Administered 2011-04-19 – 2011-04-22 (×4): 1 g via INTRAVENOUS
  Filled 2011-04-19 (×5): qty 10

## 2011-04-19 MED ORDER — INSULIN GLARGINE 100 UNIT/ML ~~LOC~~ SOLN
15.0000 [IU] | Freq: Every day | SUBCUTANEOUS | Status: DC
  Administered 2011-04-19: 15 [IU] via SUBCUTANEOUS

## 2011-04-19 MED ORDER — FUROSEMIDE 10 MG/ML IJ SOLN
80.0000 mg | Freq: Once | INTRAMUSCULAR | Status: AC
  Administered 2011-04-19: 80 mg via INTRAVENOUS
  Filled 2011-04-19: qty 8

## 2011-04-19 MED ORDER — BISOPROLOL FUMARATE 5 MG PO TABS
5.0000 mg | ORAL_TABLET | Freq: Two times a day (BID) | ORAL | Status: DC
Start: 2011-04-19 — End: 2011-04-28
  Administered 2011-04-19 – 2011-04-28 (×18): 5 mg via ORAL
  Filled 2011-04-19 (×19): qty 1

## 2011-04-19 NOTE — Progress Notes (Signed)
CARE MANAGEMENT NOTE 04/19/2011  Patient:  [name] [name], [name] [name]   Account Number:  [id number]  Date Initiated:  04/19/2011  Documentation initiated by:  Lenola Lockner  Subjective/Objective Assessment:   pt admitted due to resp failure     Action/Plan:   lives at home   Anticipated DC Date:  04/22/2011   Anticipated DC Plan:  HOME W HOME HEALTH SERVICES  In-house referral  NA      DC Planning Services  NA      [facility] Choice  NA   Choice offered to / List presented to:  NA   DME arranged  NA      DME agency  NA     HH arranged  NA      HH agency  NA   Status of service:  In process, will continue to follow Medicare Important Message given?  NA - LOS <3 / Initial given by admissions (If response is "NO", the following Medicare IM given date fields will be blank) Date Medicare IM given:   Date Additional Medicare IM given:    Discharge Disposition:    Per UR Regulation:  Reviewed for med. necessity/level of care/duration of stay  Comments:  [id number]/Regina Ganci,RN,BSN,CCM

## 2011-04-19 NOTE — Progress Notes (Signed)
ANTIBIOTIC CONSULT NOTE - Follow up  Pharmacy Consult for Ceftriaxone Indication: UTI  Vital Signs: Temp: 97.8 F (36.6 C) (02/18 0800) Temp src: Oral (02/18 0800) BP: 139/73 mmHg (02/18 0800) Pulse Rate: 111  (02/18 0800)  Labs:  Basename 04/19/11 0315 04/17/11 0251 04/16/11 1320  WBC 13.6* 12.1* 13.7*  HGB 18.1* 18.3* 20.2*  PLT 92* 64* 46*  LABCREA -- -- --  CREATININE 2.37* 3.41* 3.05*   Medications: Anti-infectives     Start     Dose/Rate Route Frequency Ordered Stop   04/18/11 1800   moxifloxacin (AVELOX) tablet 400 mg        400 mg Oral Daily-1800 04/18/11 0915     04/16/11 1400   moxifloxacin (AVELOX) IVPB 400 mg  Status:  Discontinued        400 mg 250 mL/hr over 60 Minutes Intravenous Every 24 hours 04/16/11 1345 04/18/11 0915           Cultures: MRSA PCR neg 2/15 BCx NGTD 2/15 Urine cx: Ecoli (pan-sensitive) 2/16 Sputum Cx: normal flora  Assessment:  [name] [name] life long smoker presents with symptoms consistent with PNA   Avelox d/c after 4 days  Urine cultrue: Ecoli (pan-sensitive)   Plan:   Ceftriaxone 1g IV Q 24 hours  Follow up renal function, labs, and cultures as available   Lynann Beaver PharmD  Pager [phone] 04/19/2011 12:12 PM

## 2011-04-19 NOTE — Progress Notes (Signed)
Inpatient Diabetes Program Recommendations  AACE/ADA: New Consensus Statement on Inpatient Glycemic Control (2009)  Target Ranges:  Prepandial:   less than 140 mg/dL      Peak postprandial:   less than 180 mg/dL (1-2 hours)      Critically ill patients:  140 - 180 mg/dL   Reason for Visit: Hyperglycemia.  Inpatient Diabetes Program Recommendations Insulin - Basal: Current dose lantus is 10 units at HS.  Home dose is Lantus 15 BID. Insulin - Meal Coverage: Currently receiving no meal coverage.  Home dose meal coverage is 20 units tid. HgbA1C: A1C of 6.1  Note:  Results for DESHAN, HEMMELGARN (MRN [id number]) as of 04/19/2011 09:36  Ref. Range 04/18/2011 07:54 04/18/2011 11:43 04/18/2011 16:19 04/18/2011 21:45 04/19/2011 07:36  Glucose-Capillary Latest Range: 70-99 mg/dL 213 (H) 086 (H) 578 (H) 280 (H) 213 (H)   Patient receiving solumedrol.  Request MD consider increasing Lantus dose to closer to home dose and add some meal coverage tid-- (perhaps starting with 6 units and titrated according to CBG's) to be given if patient eats at least 50% of meal.  Thank you.

## 2011-04-19 NOTE — Clinical Documentation Improvement (Signed)
BMI DOCUMENTATION CLARIFICATION QUERY  THIS DOCUMENT IS NOT A PERMANENT PART OF THE MEDICAL RECORD  TO RESPOND TO THE THIS QUERY, FOLLOW THE INSTRUCTIONS BELOW:  1. If needed, update documentation for the patient's encounter via the notes activity.  2. Access this query again and click edit on the In [employer].  3. After updating, or not, click F2 to complete all highlighted (required) fields concerning your review. Select "additional documentation in the medical record" OR "no additional documentation provided".  4. Click Sign note button.  5. The deficiency will fall out of your In Basket *Please let us know if you are not able to complete this workflow by phone or e-mail (listed below).         04/19/11  Dear Dr. Sherene Sires, Judie Petit Marton Redwood  In an effort to better capture your patient's severity of illness, reflect appropriate length of stay and utilization of resources, a review of the patient medical record has revealed the following indicators.    Based on your clinical judgment, please clarify and document in a progress note and/or discharge summary the clinical condition associated with the following supporting information:  In responding to this query please exercise your independent judgment.  The fact that a query is asked, does not imply that any particular answer is desired or expected.  Pt's BMI= 41.1, and H/P states pt with obesity and has sleep apnea necessitating a CPAP @ 6 liter of 02 @ 6 liters.   Please clarify whether or not BMI can be linked to one of he diagnoses listed below and document in pn  and d/c. Thank You!  BEST PRACTICE: When linking BMI to a diagnosis please document both BMI and diagnosis together in pn for accuracy of SOI and ROM.  Please document all that apply  Possible Clinical conditions  Morbid Obesity W/ BMI=   Obesity Hypoventilation Syndrome (OHS)  Underweight w/BMI=  Other condition___________________  Cannot Clinically determine  _____________  Risk Factors: Resp Failure, Sleep Apnea/CPAP  Sign & Symptoms: Obesity in H/P  BMI-41.1 5'8"/270lbs   Medications:  ipratropium (ATROVENT) nebulizer solution 0.5 mg    methylPREDNISolone sodium succinate    Reviewed:  no additional documentation provided 04/20/2011 ljh  Thank You,  Enis Slipper  RN, BSN, CCDS Clinical Documentation Specialist [facility] HIM Dept Pager: [phone] / E-mail: [email or web address]  Health Information Management [place]

## 2011-04-19 NOTE — Progress Notes (Signed)
LOS: 3 Requesting MD: Dr. Ranae Palms EDP Primary = [name]  PCCM Progress Note   Brief patient profile:  71 yobm  Active smoker with CRI on ACEI to [facility] EDP  2/15 with 2 days of increasing DOE/SOB without fevers, chills, sweats or sputum production.  Placed on NIMVS for hypoxic/hypercarbic resp failure.  PCCM asked to admit    Micro/sepsis: MRSA screen > neg 2/15 bc x 2>> 2/15 uc>>ecoli>>ss roc 2/16 SPUTUM  >>nl flora Urine Strep ag 2/15 > neg Procalcitonin 2/15   0.36  Antibiotics: 2/15 Avelox(aecodp)>>2/18 2/18 roc(uti/aecopd)>>   Tests / Events: Trop 2.0 2/15 > added bisoprolol 2.5 mg daily am 2/16 plus asa  Overnight Used cpap   Vital Signs: BP 139/73  Pulse 111  Temp(Src) 97.8 F (36.6 C) (Oral)  Resp 21  Ht 5\' 9"  (1.753 m)  Wt 257 lb 8 oz (116.8 kg)  BMI 38.03 kg/m2  SpO2 93%    Intake/Output Summary (Last 24 hours) at 04/19/11 1049 Last data filed at 04/19/11 1000  Gross per 24 hour  Intake   1800 ml  Output   2555 ml  Net   -755 ml     Physical Examination: General:  MO AAM  nad @ 30 degrees hob / np Neuro:  intact   HEENT:  Short neck, no jvd Cardiovascular:  hsd rrr Lungs:  +exp wheeze, increased wob, +accesory muscle use. Abdomen:  Obese, + bs Musculoskeletal:  Intact Skin:  Warm +lower ext edema     Labs and Imaging:  No results found.       Lab 04/19/11 0315 04/17/11 0251 04/16/11 1320  NA 137 138 138  K 5.9* 5.5* 5.0  CL 100 98 94*  CO2 30 31 29   BUN 82* 63* 48*  CREATININE 2.37* 3.41* 3.05*  GLUCOSE 291* 210* 212*    Lab 04/19/11 0315 04/17/11 0251 04/16/11 1320  HGB 18.1* 18.3* 20.2*  HCT 55.1* 57.4* 61.9*  WBC 13.6* 12.1* 13.7*  PLT 92* 64* 46*   ABG    Component Value Date/Time   PHART 7.292* 04/16/2011 1330   PCO2ART 65.4* 04/16/2011 1330   PO2ART 80.5 04/16/2011 1330   HCO3 30.6* 04/16/2011 1330   TCO2 25.5 04/16/2011 1330   O2SAT 94.1 04/16/2011 1330    Assessment and Plan: Hypoxic, hypercarbic resp failure  in the setting of ongoing tobacco abuse, aecopd, osa and ? Volume overload on ACEI - changed bd to xopenex 2/16 due to pos trop -steroids -uds  D/C ACEI 2/15  CRI base creatine 2.05 Monitor Lab Results  Component Value Date   CREATININE 2.37* 04/19/2011   CREATININE 3.41* 04/17/2011   CREATININE 3.05* 04/16/2011   D/C ACEI 2/15 Keep tank full as long as no pulmonary edema  DM -ssi  SZ DO -cont home meds  Thrombocytopenia ? Cause/ chronic Lab Results  Component Value Date   PLT 92* 04/19/2011   PLT 64* 04/17/2011   PLT 46* 04/16/2011   PLT 57* 06/09/2009   PLT 53* 04/14/2009   PLT 73* 01/01/2009   should be able to tol low dose asa on ppi bid  Hyperkalemia  -more aggressive glucose control  Positive cardiac enzymes No clinical ischemia, prob secondary to stress Rx asa/ bisoprolol if bp tolerates> titrated to 5 mg bid 2/18 -change resp rx to xopenex  Best practices / Disposition: -->ICU-SD status under PCCM -->full code -->PAS DVT Px (low plt) -->Protonix for GI Px Consider tx to floor in am 2/19  Brett Canales Minor ACNP [facility] Pollack PCCM Pager [phone] till 3 pm If no answer page [phone] 04/19/2011, 10:49 AM  Pt independently  seen and examined and available cxr's reviewed and I agree with above findings/ imp/ plan    Sandrea Hughs, MD Pulmonary and Critical Care Medicine [facility] Cell [phone]

## 2011-04-20 DIAGNOSIS — J9600 Acute respiratory failure, unspecified whether with hypoxia or hypercapnia: Secondary | ICD-10-CM

## 2011-04-20 DIAGNOSIS — N179 Acute kidney failure, unspecified: Secondary | ICD-10-CM

## 2011-04-20 DIAGNOSIS — J441 Chronic obstructive pulmonary disease with (acute) exacerbation: Secondary | ICD-10-CM

## 2011-04-20 LAB — URINE CULTURE
Colony Count: 95000
Culture  Setup Time: 201302160241

## 2011-04-20 LAB — CBC
HCT: 57.3 % — ABNORMAL HIGH (ref 39.0–52.0)
MCHC: 31.6 g/dL (ref 30.0–36.0)
MCV: 93.8 fL (ref 78.0–100.0)
RDW: 14.5 % (ref 11.5–15.5)

## 2011-04-20 LAB — BLOOD GAS, ARTERIAL
Acid-Base Excess: 1 mmol/L (ref 0.0–2.0)
Drawn by: 129801
O2 Content: 6 L/min
pCO2 arterial: 66 mmHg (ref 35.0–45.0)
pH, Arterial: 7.283 — ABNORMAL LOW (ref 7.350–7.450)

## 2011-04-20 LAB — BASIC METABOLIC PANEL
BUN: 79 mg/dL — ABNORMAL HIGH (ref 6–23)
Creatinine, Ser: 2.19 mg/dL — ABNORMAL HIGH (ref 0.50–1.35)
GFR calc Af Amer: 33 mL/min — ABNORMAL LOW (ref >90)
GFR calc non Af Amer: 28 mL/min — ABNORMAL LOW (ref >90)
Glucose, Bld: 294 mg/dL — ABNORMAL HIGH (ref 70–99)

## 2011-04-20 LAB — GLUCOSE, CAPILLARY

## 2011-04-20 MED ORDER — FUROSEMIDE 10 MG/ML IJ SOLN
80.0000 mg | Freq: Once | INTRAMUSCULAR | Status: AC
  Administered 2011-04-20: 80 mg via INTRAVENOUS
  Filled 2011-04-20: qty 8

## 2011-04-20 MED ORDER — METHYLPREDNISOLONE SODIUM SUCC 40 MG IJ SOLR
40.0000 mg | Freq: Two times a day (BID) | INTRAMUSCULAR | Status: DC
  Administered 2011-04-20 – 2011-04-21 (×2): 40 mg via INTRAVENOUS
  Filled 2011-04-20 (×2): qty 1

## 2011-04-20 MED ORDER — CHLORHEXIDINE GLUCONATE 0.12 % MT SOLN
15.0000 mL | Freq: Two times a day (BID) | OROMUCOSAL | Status: DC
  Administered 2011-04-20 – 2011-04-27 (×14): 15 mL via OROMUCOSAL
  Filled 2011-04-20 (×18): qty 15

## 2011-04-20 MED ORDER — BIOTENE DRY MOUTH MT LIQD
15.0000 mL | Freq: Two times a day (BID) | OROMUCOSAL | Status: DC
Start: 2011-04-21 — End: 2011-04-28
  Administered 2011-04-21 – 2011-04-27 (×12): 15 mL via OROMUCOSAL

## 2011-04-20 MED ORDER — INSULIN GLARGINE 100 UNIT/ML ~~LOC~~ SOLN
10.0000 [IU] | Freq: Two times a day (BID) | SUBCUTANEOUS | Status: DC
  Administered 2011-04-20 – 2011-04-21 (×2): 10 [IU] via SUBCUTANEOUS

## 2011-04-20 NOTE — Progress Notes (Signed)
LOS: 4 Requesting MD: Dr. Ranae Palms EDP Primary = [name]  PCCM Progress Note   Brief patient profile:  71 yobm  Active smoker with CRI on ACEI to [facility] EDP  2/15 with 2 days of increasing DOE/SOB without fevers, chills, sweats or sputum production.  Placed on NIMVS for hypoxic/hypercarbic resp failure.  PCCM asked to admit    Micro/sepsis: MRSA screen > neg 2/15 bc x 2>> 2/15 uc>>ecoli>>ss roc 2/16 SPUTUM  >>nl flora Urine Strep ag 2/15 > neg Procalcitonin 2/15   0.36  Antibiotics: 2/15 Avelox(aecodp)>>2/18 2/18 roc(uti/aecopd)>>   Tests / Events:  Trop 2.0 2/15 > added bisoprolol 2.5 mg daily am 2/16 plus asa  Overnight Used cpap , increased wob  Vital Signs: BP 121/75  Pulse 87  Temp(Src) 97.9 F (36.6 C) (Oral)  Resp 26  Ht 5\' 9"  (1.753 m)  Wt 264 lb 8.8 oz (120 kg)  BMI 39.07 kg/m2  SpO2 91%    Intake/Output Summary (Last 24 hours) at 04/20/11 0956 Last data filed at 04/20/11 0800  Gross per 24 hour  Intake   2440 ml  Output   2545 ml  Net   -105 ml     Physical Examination: General:  MO AAM  nad @ 30 degrees hob Neuro:  intact   HEENT:  Short neck, no jvd Cardiovascular:  hsd rrr Lungs:  +exp wheeze, increased wob, +accesory muscle use. Abdomen:  Obese, + bs Musculoskeletal:  Intact Skin:  Warm +lower ext edema     Labs and Imaging:  No results found.       Lab 04/20/11 0304 04/19/11 0315 04/17/11 0251  NA 136 137 138  K 5.7* 5.9* 5.5*  CL 99 100 98  CO2 30 30 31   BUN 79* 82* 63*  CREATININE 2.19* 2.37* 3.41*  GLUCOSE 294* 291* 210*    Lab 04/20/11 0304 04/19/11 0315 04/17/11 0251  HGB 18.1* 18.1* 18.3*  HCT 57.3* 55.1* 57.4*  WBC 11.7* 13.6* 12.1*  PLT 114* 92* 64*   ABG    Component Value Date/Time   PHART 7.292* 04/16/2011 1330   PCO2ART 65.4* 04/16/2011 1330   PO2ART 80.5 04/16/2011 1330   HCO3 30.6* 04/16/2011 1330   TCO2 25.5 04/16/2011 1330   O2SAT 94.1 04/16/2011 1330    Assessment and Plan: Hypoxic, hypercarbic  resp failure in the setting of ongoing tobacco abuse, aecopd, osa and ? Volume overload on ACEI   Lab 04/16/11 1330 04/16/11 1018  PHART 7.292* 7.272*  PCO2ART 65.4* 71.8*  PO2ART 80.5 139.0*  HCO3 30.6* 32.0*  TCO2 25.5 26.9  O2SAT 94.1 98.1    PLAN - changed bd to xopenex 2/16 due to pos trop.  D/C ACEI 2/15 -steroids -uds - check abg 2/19 and if hypercarbic do nocturnal bipap  CRI base creatine 2.05 Monitor Lab Results  Component Value Date   CREATININE 2.19* 04/20/2011   CREATININE 2.37* 04/19/2011   CREATININE 3.41* 04/17/2011   D/C ACEI 2/15 Keep tank full as long as no pulmonary edema  DM -ssi  SZ DO -cont home meds  Thrombocytopenia ? Cause/ chronic Lab Results  Component Value Date   PLT 114* 04/20/2011   PLT 92* 04/19/2011   PLT 64* 04/17/2011   PLT 57* 06/09/2009   PLT 53* 04/14/2009   PLT 73* 01/01/2009   should be able to tol low dose asa on ppi bid  Hyperkalemia  Lab 04/20/11 0304 04/19/11 0315 04/17/11 0251 04/16/11 1320 04/16/11 0950  K 5.7* 5.9* 5.5*  5.0 4.9     -more aggressive glucose control  Positive cardiac enzymes   Lab 04/17/11 1402 04/17/11 0745 04/17/11 0251 04/16/11 2051 04/16/11 1320  TROPONINI 1.72* 2.00* 1.73* 1.39* <0.30    Lab 04/16/11 1320  PROBNP 613.7*   PLAN No clinical ischemia, prob secondary to stress Rx asa/ bisoprolol if bp tolerates> titrated to 5 mg bid 2/18 -change resp rx to xopenex Check bnp 2/20 Check echo 2/20  Best practices / Disposition: -->ICU-SD status under PCCM -->full code -->PAS DVT Px (low plt) -->Protonix for GI Px        Brett Canales Minor ACNP [facility] PCCM Pager [phone] till 3 pm If no answer page [phone] 04/20/2011, 9:56 AM   STAFF NOTE: I, Dr Lavinia Sharps have personally reviewed patient's available data, including medical history, events of note, physical examination and test results as part of my evaluation. I have discussed with resident/NP and other care providers such as  pharmacist, RN and RRT.  In addition,  I personally evaluated patient and elicited key findings of acute hypercarbic resp failure - improved. Question of stress induced cardiac ischemia and acute on chronic diastolic v systolic chf exists. Will get abg and if shows persistence of hypercarbia do bipap tonight. Also, check echo and repeat bnp and depending on results cards consult v more diuresis.  Rest per NP/medical resident whose note is outlined above and that I agree with  .  Dr. Kalman Shan, M.D., [facility]C.P Pulmonary and Critical Care Medicine Staff Physician [facility] [place] Pulmonary and Critical Care Pager: [phone], If no answer or between  15:00h - 7:00h: call [phone]  04/20/2011 4:41 PM

## 2011-04-20 NOTE — Progress Notes (Signed)
Critical PCO2 66 called to Critical Care. No new orders given.

## 2011-04-21 ENCOUNTER — Inpatient Hospital Stay (HOSPITAL_COMMUNITY): Payer: Medicare Other

## 2011-04-21 DIAGNOSIS — I369 Nonrheumatic tricuspid valve disorder, unspecified: Secondary | ICD-10-CM

## 2011-04-21 DIAGNOSIS — I5031 Acute diastolic (congestive) heart failure: Secondary | ICD-10-CM

## 2011-04-21 LAB — BASIC METABOLIC PANEL
CO2: 29 mEq/L (ref 19–32)
Calcium: 9.2 mg/dL (ref 8.4–10.5)
GFR calc non Af Amer: 29 mL/min — ABNORMAL LOW (ref >90)
Sodium: 139 mEq/L (ref 135–145)

## 2011-04-21 LAB — GLUCOSE, CAPILLARY
Glucose-Capillary: 244 mg/dL — ABNORMAL HIGH (ref 70–99)
Glucose-Capillary: 365 mg/dL — ABNORMAL HIGH (ref 70–99)
Glucose-Capillary: 232 mg/dL — ABNORMAL HIGH (ref 70–99)

## 2011-04-21 LAB — MAGNESIUM: Magnesium: 2.6 mg/dL — ABNORMAL HIGH (ref 1.5–2.5)

## 2011-04-21 LAB — CBC
MCH: 30.3 pg (ref 26.0–34.0)
Platelets: 112 10*3/uL — ABNORMAL LOW (ref 150–400)
RBC: 6.08 MIL/uL — ABNORMAL HIGH (ref 4.22–5.81)

## 2011-04-21 LAB — PHOSPHORUS: Phosphorus: 3.8 mg/dL (ref 2.3–4.6)

## 2011-04-21 MED ORDER — PERFLUTREN LIPID MICROSPHERE 6.52 MG/ML IV SUSP
10.0000 uL/kg | Freq: Once | INTRAVENOUS | Status: AC
  Administered 2011-04-21: 1.331 mg via INTRAVENOUS
  Filled 2011-04-21: qty 2

## 2011-04-21 MED ORDER — ASPIRIN 81 MG PO CHEW
81.0000 mg | CHEWABLE_TABLET | Freq: Every day | ORAL | Status: DC
  Administered 2011-04-21 – 2011-04-27 (×7): 81 mg via ORAL
  Filled 2011-04-21 (×7): qty 1

## 2011-04-21 MED ORDER — INSULIN GLARGINE 100 UNIT/ML ~~LOC~~ SOLN
14.0000 [IU] | Freq: Two times a day (BID) | SUBCUTANEOUS | Status: DC
  Administered 2011-04-21 – 2011-04-25 (×8): 14 [IU] via SUBCUTANEOUS

## 2011-04-21 MED ORDER — METHYLPREDNISOLONE SODIUM SUCC 40 MG IJ SOLR
40.0000 mg | Freq: Every day | INTRAMUSCULAR | Status: DC
  Administered 2011-04-22 – 2011-04-24 (×3): 40 mg via INTRAVENOUS
  Filled 2011-04-21 (×3): qty 1

## 2011-04-21 MED ORDER — FUROSEMIDE 10 MG/ML IJ SOLN
40.0000 mg | Freq: Every day | INTRAMUSCULAR | Status: DC
  Administered 2011-04-21 – 2011-04-22 (×2): 40 mg via INTRAVENOUS
  Filled 2011-04-21 (×3): qty 4

## 2011-04-21 MED ORDER — SODIUM POLYSTYRENE SULFONATE 15 GM/60ML PO SUSP
15.0000 g | Freq: Once | ORAL | Status: AC
  Administered 2011-04-21: 15 g via ORAL
  Filled 2011-04-21: qty 60

## 2011-04-21 MED FILL — Perflutren Lipid Microsphere IV Susp 6.52 MG/ML: INTRAVENOUS | Qty: 2 | Status: AC

## 2011-04-21 NOTE — Progress Notes (Signed)
LOS: 5 Requesting MD: Dr. 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  Placed on NIMVS for hypoxic/hypercarbic resp failure.  PCCM asked to admit    Micro/sepsis: MRSA screen > neg 2/15 bc x 2>> 2/15 uc>>ecoli>>ss roc 2/16 SPUTUM  >>nl flora Urine Strep ag 2/15 > neg Procalcitonin 2/15   0.36  Antibiotics: 2/15 Avelox(aecodp)>>2/18 2/18 roc(uti/aecopd)>>   Tests / Events: Trop 2.0 2/15 > added bisoprolol 2.5 mg daily am 2/16 plus asa  2/20 new onset scant hemoptysis. ECHO ef 60% but RV dilated  Overnight Used cpap , increased wob. C/o small hemoptysis for first time to [place] but says this is a new problems since admission and has been having it daily x 5days. ABG shows  Persistent acidosis. He is using cpap at night. BNP has gone up,. K tends to be high though he is not getting any kcl  Vital Signs: BP 129/69  Pulse 65  Temp(Src) 97.6 F (36.4 C) (Oral)  Resp 25  Ht 5\' 9"  (1.753 m)  Wt 267 lb 3.2 oz (121.2 kg)  BMI 39.46 kg/m2  SpO2 93%    Intake/Output Summary (Last 24 hours) at 04/21/11 0957 Last data filed at 04/21/11 0600  Gross per 24 hour  Intake 2034.83 ml  Output   2575 ml  Net -540.17 ml     Physical Examination: General:  MO AAM  nad @ 30 degrees hob Neuro:  intact   HEENT:  Short neck, no jvd Cardiovascular:  hsd rrr Lungs:  +exp wheeze, increased wob, +accesory muscle use. Abdomen:  Obese, + bs Musculoskeletal:  Intact Skin:  Warm +lower ext edema     Labs and Imaging:  Dg Chest Port 1 View  04/21/2011  *RADIOLOGY REPORT*  Clinical Data: COPD  PORTABLE CHEST - 1 VIEW  Comparison: 04/17/2011; 04/16/2011; 04/09/2010/  Findings: Examination is degraded secondary to patient positioning. Grossly unchanged cardiac silhouette and mediastinal contours. Interval increase in  bilateral lower and right mid lung heterogeneous air space opacities.  Pulmonary vasculature is indistinct with cephalization of flow.  No definite pleural effusion, though note, the right costophrenic angle is excluded from view.  No definite pneumothorax.  Unchanged bones.  IMPRESSION: 1.  Minimally degraded examination with findings of worsening bilateral heterogeneous air space opacities, possibly atelectasis though underlying infection not excluded. Further evaluation may be obtained with a PA and lateral chest radiograph as clinically indicated.  2.  Query mild pulmonary edema.  Original Report Authenticated By: Waynard Reeds, M.D.      Lab 04/21/11 0305 04/16/11 1320  PROBNP 8197.0* 613.7*       Lab 04/21/11 0305 04/20/11 0304 04/19/11 0315  NA 139 136 137  K 5.9* 5.7* 5.9*  CL 102 99 100  CO2 29 30 30   BUN 84* 79* 82*  CREATININE 2.15* 2.19* 2.37*  GLUCOSE 281* 294* 291*    Lab 04/21/11 0305 04/20/11 0304 04/19/11 0315  HGB 18.4* 18.1* 18.1*  HCT 56.9* 57.3* 55.1*  WBC 13.0* 11.7* 13.6*  PLT 112* 114* 92*   ABG    Component Value Date/Time   PHART 7.283* 04/20/2011 1745   PCO2ART 66.0* 04/20/2011 1745   PO2ART 69.8* 04/20/2011 1745   HCO3 30.3* 04/20/2011 1745   TCO2 25.7 04/20/2011 1745   O2SAT 93.0 04/20/2011  1745    Assessment and Plan: Hypoxic, 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 Volume overload on ACEI   Lab 04/20/11 1745 04/16/11 1330 04/16/11 1018  PHART 7.283* 7.292* 7.272*  PCO2ART 66.0* 65.4* 71.8*  PO2ART 69.8* 80.5 139.0*  HCO3 30.3* 30.6* 32.0*  TCO2 25.7 25.5 26.9  O2SAT 93.0 94.1 98.1   He is still acidotic  PLAN - changed bd to xopenex 2/16 due to pos trop.   - s/p D/C ACEI 2/15 -steroids to continue -uds - check abg 2/21 and if hypercarbic do day time bipap  CRI base creatine 2.05   Lab 04/21/11 0305 04/16/11 1320  PROBNP 8197.0* 613.7*    Lab 04/21/11 0305 04/20/11 0304 04/19/11 0315  04/17/11 0251 04/16/11 1320  K 5.9* 5.7* 5.9* 5.5* 5.0    Lab Results  Component Value Date   CREATININE 2.15* 04/21/2011   CREATININE 2.19* 04/20/2011   CREATININE 2.37* 04/19/2011   D/C ACEI 2/15 START DIURESIS 2/20 due to rising BNP and K  Hemoptysis: 2/20 - ? 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 Cause/ chronic Lab Results  Component Value Date   PLT 112* 04/21/2011   PLT 114* 04/20/2011   PLT 92* 04/19/2011   PLT 57* 06/09/2009   PLT 53* 04/14/2009   PLT 73* 01/01/2009   should be able to tol low dose asa on ppi bid  Hyperkalemia  Lab 04/21/11 0305 04/20/11 0304 04/19/11 0315 04/17/11 0251 04/16/11 1320  K 5.9* 5.7* 5.9* 5.5* 5.0   -more aggressive glucose control 2/20 kaexlate and start diuresis  Positive cardiac enzymes likely due to stress /ACUTE DIASTOLIC CHF    Lab 04/17/11 1402 04/17/11 0745 04/17/11 0251 04/16/11 2051 04/16/11 1320  TROPONINI 1.72* 2.00* 1.73* 1.39* <0.30    Lab 04/21/11 0305 04/16/11 1320  PROBNP 8197.0* 613.7*   ECHO 2./20 - cor pulmonale and LVEF 60%  PLAN Rx asa/ bisoprolol if bp tolerates> titrated to 5 mg bid 2/18 -change resp rx to xopenex - DIURESISE AGGRESSIVELY 2/20  Best practices / Disposition: -->ICU-SD status under PCCM -->full code -->PAS DVT Px (low plt) -->Protonix for GI Px        Steve Minor ACNP [facility] PCCM Pager [phone] till 3 pm If no answer page [phone] 04/21/2011, 9:57 AM   STAFF NOTE: I, Dr Lavinia Sharps have personally reviewed patient's available data, including medical history, events of note, physical examination and test results as part of my evaluation. I have discussed with resident/NP and other care providers such as pharmacist, RN and RRT.  In addition,  I personally evaluated patient and elicited key findings of having hemoptysis that i I think is likely due to acute diastolic chf. Will start  aggressive diuresis. I am still concerned by acute on chronic resp acidosis on his ABG. He wil definitely need nocturnal cpap. Will recheck abg am of 04/22/11 after diuersis. Keep in SDU. Marland Kitchen  Rest per NP/medical resident whose note is outlined above and that I agree with  The patient is critically ill with multiple organ systems failure and requires high complexity decision making for assessment and support, frequent evaluation and titration of therapies, application of advanced monitoring technologies and extensive interpretation of multiple databases.   Critical Care Time devoted to patient care services described in this note is  31 Minutes.  Dr. Kalman Shan,  M.D., F.C.C.P Pulmonary and Critical Care Medicine Staff Physician [facility] [place] [facility] Pager: [phone], If no answer or between  15:00h - 7:00h: call [phone]  04/21/2011 4:39 PM

## 2011-04-21 NOTE — Progress Notes (Signed)
*  PRELIMINARY RESULTS* Echocardiogram 2D Echocardiogram has been performed.  [name] [name] [name] 04/21/2011, 10:34 AM

## 2011-04-22 ENCOUNTER — Inpatient Hospital Stay (HOSPITAL_COMMUNITY): Payer: Medicare Other

## 2011-04-22 DIAGNOSIS — J441 Chronic obstructive pulmonary disease with (acute) exacerbation: Secondary | ICD-10-CM

## 2011-04-22 DIAGNOSIS — R609 Edema, unspecified: Secondary | ICD-10-CM

## 2011-04-22 DIAGNOSIS — I5031 Acute diastolic (congestive) heart failure: Secondary | ICD-10-CM

## 2011-04-22 DIAGNOSIS — N179 Acute kidney failure, unspecified: Secondary | ICD-10-CM

## 2011-04-22 DIAGNOSIS — J9600 Acute respiratory failure, unspecified whether with hypoxia or hypercapnia: Secondary | ICD-10-CM

## 2011-04-22 LAB — GLUCOSE, CAPILLARY: Glucose-Capillary: 289 mg/dL — ABNORMAL HIGH (ref 70–99)

## 2011-04-22 LAB — CULTURE, BLOOD (ROUTINE X 2)
Culture  Setup Time: 201302152128
Culture: NO GROWTH

## 2011-04-22 LAB — BASIC METABOLIC PANEL
Calcium: 9.6 mg/dL (ref 8.4–10.5)
Chloride: 104 mEq/L (ref 96–112)
Creatinine, Ser: 2.03 mg/dL — ABNORMAL HIGH (ref 0.50–1.35)
GFR calc Af Amer: 36 mL/min — ABNORMAL LOW (ref >90)
GFR calc non Af Amer: 31 mL/min — ABNORMAL LOW (ref >90)

## 2011-04-22 LAB — APTT: aPTT: 33 seconds (ref 24–37)

## 2011-04-22 LAB — BLOOD GAS, ARTERIAL
Acid-Base Excess: 4.2 mmol/L — ABNORMAL HIGH (ref 0.0–2.0)
Bicarbonate: 32 mEq/L — ABNORMAL HIGH (ref 20.0–24.0)
O2 Content: 4 L/min
O2 Saturation: 88.4 %
TCO2: 26.6 mmol/L (ref 0–100)
pO2, Arterial: 55.5 mmHg — ABNORMAL LOW (ref 80.0–100.0)

## 2011-04-22 LAB — CBC
MCHC: 32.8 g/dL (ref 30.0–36.0)
MCV: 93.4 fL (ref 78.0–100.0)
Platelets: 91 10*3/uL — ABNORMAL LOW (ref 150–400)
RDW: 14.5 % (ref 11.5–15.5)
WBC: 14.9 10*3/uL — ABNORMAL HIGH (ref 4.0–10.5)

## 2011-04-22 MED ORDER — FUROSEMIDE 10 MG/ML IJ SOLN
40.0000 mg | Freq: Two times a day (BID) | INTRAMUSCULAR | Status: DC
  Administered 2011-04-22 – 2011-04-23 (×3): 40 mg via INTRAVENOUS
  Filled 2011-04-22 (×5): qty 4

## 2011-04-22 NOTE — Progress Notes (Signed)
LOS: 6 Requesting MD: Dr. 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  Placed on NIMVS for hypoxic/hypercarbic resp failure.  PCCM asked to admit    Micro/sepsis: MRSA screen > neg 2/15 bc x 2>>neg 2/15 uc>>ecoli>>ss roc 2/16 SPUTUM  >>nl flora Urine Strep ag 2/15 > neg Procalcitonin 2/15   0.36  Antibiotics: 2/15 Avelox(aecodp)>>2/18 2/18 roc(uti/aecopd)>>   Tests / Events: Trop 2.0 2/15 > added bisoprolol 2.5 mg daily am 2/16 plus asa  2/20 new onset scant hemoptysis. ECHO ef 60% but RV dilated  Overnight Used cpap , increased wob. C/o small hemoptysis for first time to [place] but says this is a new problems since admission and has been having it daily x 5days. ABG shows  Persistent acidosis. He is using cpap at night. BNP has gone up,. K tends to be high though he is not getting any kcl  Vital Signs: BP 149/81  Pulse 82  Temp(Src) 98.3 F (36.8 C) (Oral)  Resp 27  Ht 5\' 9"  (1.753 m)  Wt 259 lb 4.2 oz (117.6 kg)  BMI 38.29 kg/m2  SpO2 93%    Intake/Output Summary (Last 24 hours) at 04/22/11 1008 Last data filed at 04/22/11 0600  Gross per 24 hour  Intake    840 ml  Output   3177 ml  Net  -2337 ml     Physical Examination: General:  MO AAM  nad sitting up Neuro:  intact   HEENT:  Short neck, no jvd Cardiovascular:  hsd rrr Lungs:  +exp wheeze, Abdomen:  Obese, + bs Musculoskeletal:  Intact Skin:  Warm +lower ext edema     Labs and Imaging:  Dg Chest Port 1 View  04/22/2011  *RADIOLOGY REPORT*  Clinical Data: Congestive heart failure.  PORTABLE CHEST - 1 VIEW  Comparison: 04/21/2011.  Findings: Thickening of the minor fissure.  Pulmonary vascular congestion.  This may represent mild congestive heart failure type changes.  Additionally, right base subtle consolidation.  This may  represent pulmonary vascular prominence and crowding of vessels although subtle infiltrate not excluded.  No gross pneumothorax. Heart size top normal.  IMPRESSION: Pulmonary vascular prominence with small right-sided pleural effusion may represent changes of mild congestive heart failure.  Question subtle right base infiltrate  Original Report Authenticated By: Fuller Canada, M.D.   Dg Chest Port 1 View  04/21/2011  *RADIOLOGY REPORT*  Clinical Data: COPD  PORTABLE CHEST - 1 VIEW  Comparison: 04/17/2011; 04/16/2011; 04/09/2010/  Findings: Examination is degraded secondary to patient positioning. Grossly unchanged cardiac silhouette and mediastinal contours. Interval increase in bilateral lower and right mid lung heterogeneous air space opacities.  Pulmonary vasculature is indistinct with cephalization of flow.  No definite pleural effusion, though note, the right costophrenic angle is excluded from view.  No definite pneumothorax.  Unchanged bones.  IMPRESSION: 1.  Minimally degraded examination with findings of worsening bilateral heterogeneous air space opacities, possibly atelectasis though underlying infection not excluded. Further evaluation may be obtained with a PA and lateral chest radiograph as clinically indicated.  2.  Query mild pulmonary edema.  Original Report Authenticated By: Waynard Reeds, M.D.      Lab 04/22/11 0305 04/21/11 0305 04/16/11 1320  PROBNP 8503.0* 8197.0* 613.7*       Lab 04/22/11 0305 04/21/11 1610  04/20/11 0304  NA 140 139 136  K 5.3* 5.9* 5.7*  CL 104 102 99  CO2 29 29 30   BUN 78* 84* 79*  CREATININE 2.03* 2.15* 2.19*  GLUCOSE 194* 281* 294*    Lab 04/22/11 0305 04/21/11 0305 04/20/11 0304  HGB 19.4* 18.4* 18.1*  HCT 59.1* 56.9* 57.3*  WBC 14.9* 13.0* 11.7*  PLT 91* 112* 114*   ABG    Component Value Date/Time   PHART 7.349* 04/22/2011 0514   PCO2ART 59.6* 04/22/2011 0514   PO2ART 55.5* 04/22/2011 0514   HCO3 32.0* 04/22/2011 0514   TCO2 26.6  04/22/2011 0514   O2SAT 88.4 04/22/2011 0514    Assessment and Plan: Hypoxic, 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 Volume overload on ACEI   Lab 04/22/11 0514 04/20/11 1745 04/16/11 1330 04/16/11 1018  PHART 7.349* 7.283* 7.292* 7.272*  PCO2ART 59.6* 66.0* 65.4* 71.8*  PO2ART 55.5* 69.8* 80.5 139.0*  HCO3 32.0* 30.3* 30.6* 32.0*  TCO2 26.6 25.7 25.5 26.9  O2SAT 88.4 93.0 94.1 98.1   He is still acidotic but improved and more chronic now  PLAN - changed bd to xopenex 2/16 due to pos trop.   - s/p D/C ACEI 2/15 -steroids to continue -uds - check abg 2/21 and if hypercarbic do day time bipap  CRI base creatine 2.05   Lab 04/22/11 0305 04/21/11 0305 04/16/11 1320  PROBNP 8503.0* 8197.0* 613.7*    Lab 04/22/11 0305 04/21/11 0305 04/20/11 0304 04/19/11 0315 04/17/11 0251  K 5.3* 5.9* 5.7* 5.9* 5.5*    Lab Results  Component Value Date   CREATININE 2.03* 04/22/2011   CREATININE 2.15* 04/21/2011   CREATININE 2.19* 04/20/2011   D/C ACEI 2/15 START DIURESIS 2/20 due to rising BNP and K  Hemoptysis: 2/20 - ? Due to diastolic chf and rising bnp PLAN -continue  asa -pas -check coagsResults for DENYM, RAHIMI (MRN [id number]) as of 04/22/2011 10:12  Ref. 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 Cause/ chronic Lab Results  Component Value Date   PLT 91* 04/22/2011   PLT 112* 04/21/2011   PLT 114* 04/20/2011   PLT 57* 06/09/2009   PLT 53* 04/14/2009   PLT 73* 01/01/2009   should be able to tol low dose asa on ppi bid  Hyperkalemia  Lab 04/22/11 0305 04/21/11 0305 04/20/11 0304 04/19/11 0315 04/17/11 0251  K 5.3* 5.9* 5.7* 5.9* 5.5*  PLAN  -more aggressive glucose control  - 2/20 kaexlate and started diuresis  Positive cardiac enzymes likely due to stress /ACUTE DIASTOLIC CHF   Lab 04/17/11 1402 04/17/11 0745 04/17/11 0251 04/16/11 2051 04/16/11 1320   TROPONINI 1.72* 2.00* 1.73* 1.39* <0.30    Lab 04/22/11 0305 04/21/11 0305 04/16/11 1320  PROBNP 8503.0* 8197.0* 613.7*   ECHO 2./20 - cor pulmonale and LVEF 60%  PLAN Rx asa/ bisoprolol if bp tolerates> titrated to 5 mg bid 2/18 -change resp rx to xopenex - DIURESISE AGGRESSIVELY starting 2/20  Best practices / Disposition: -->ICU-SD status under PCCM -->full code -->PAS DVT Px (low plt) -->Protonix for GI Px        Steve Minor ACNP [facility] PCCM Pager [phone] till 3 pm If no answer page [phone] 04/22/2011, 10:08 AM   STAFF NOTE: I, Dr Lavinia Sharps have personally reviewed patient's available data, including medical history, events of note, physical examination and  test results as part of my evaluation. I have discussed with resident/NP and other care providers such as pharmacist, RN and RRT.  In addition,  I personally evaluated patient and elicited key findings of acute on chronic hypercarbic failure due to aecopd and acute diastolic chf. He is still having hemoptysis; cause not clear. I will increase diuresis, get CT to look for lung mass (cannot do CT PE protocol due to creatinine) and get duplex for DVT.  Rest per NP/medical resident whose note is outlined above and that I agree with   Dr. Kalman Shan, M.D., [facility].C.P Pulmonary and Critical Care Medicine Staff Physician [facility] and Critical Care Pager: [phone], If no answer or between  15:00h - 7:00h: call [phone]  04/22/2011 5:22 PM

## 2011-04-22 NOTE — Progress Notes (Signed)
CARE MANAGEMENT NOTE 04/22/2011  Patient:  [name] [name], [name] [name]   Account Number:  [id number]  Date Initiated:  04/19/2011  Documentation initiated by:  Russia Scheiderer  Subjective/Objective Assessment:   pt admitted due to resp failure     Action/Plan:   lives at home   Anticipated DC Date:  04/25/2011   Anticipated DC Plan:  HOME W HOME HEALTH SERVICES  In-house referral  NA      DC Planning Services  NA      [facility] Choice  NA   Choice offered to / List presented to:  NA   DME arranged  NA      DME agency  NA     HH arranged  NA      HH agency  NA   Status of service:  In process, will continue to follow Medicare Important Message given?  NA - LOS <3 / Initial given by admissions (If response is "NO", the following Medicare IM given date fields will be blank) Date Medicare IM given:   Date Additional Medicare IM given:    Discharge Disposition:    Per UR Regulation:  Reviewed for med. necessity/level of care/duration of stay  Comments:  [id number]/Issaic Welliver,RN,BSN,CCM

## 2011-04-22 NOTE — Progress Notes (Signed)
Bilateral lower extremity venous duplex completed.  Preliminary report is negative for DVT, SVT, or a Baker's cyst. 

## 2011-04-22 NOTE — Progress Notes (Signed)
ANTIBIOTIC CONSULT NOTE - Follow up  Pharmacy Consult for Ceftriaxone Indication: UTI  Vital Signs: Temp: 98.3 F (36.8 C) (02/21 1200) Temp src: Oral (02/21 1200) BP: 149/81 mmHg (02/21 0800) Pulse Rate: 81  (02/21 1200)  Labs:  Basename 04/22/11 0305 04/21/11 0305 04/20/11 0304  WBC 14.9* 13.0* 11.7*  HGB 19.4* 18.4* 18.1*  PLT 91* 112* 114*  LABCREA -- -- --  CREATININE 2.03* 2.15* 2.19*   Medications: Anti-infectives     Start     Dose/Rate Route Frequency Ordered Stop   04/19/11 1400   cefTRIAXone (ROCEPHIN) 1 g in dextrose 5 % 50 mL IVPB        1 g 100 mL/hr over 30 Minutes Intravenous Every 24 hours 04/19/11 1306     04/18/11 1800   moxifloxacin (AVELOX) tablet 400 mg  Status:  Discontinued        400 mg Oral Daily-1800 04/18/11 0915 04/19/11 1306   04/16/11 1400   moxifloxacin (AVELOX) IVPB 400 mg  Status:  Discontinued        400 mg 250 mL/hr over 60 Minutes Intravenous Every 24 hours 04/16/11 1345 04/18/11 0915           Cultures: MRSA PCR neg 2/15 BCx NGF 2/15 Urine cx: Ecoli (pan-sensitive) 2/16 Sputum Cx: normal flora  Assessment:  71YOM life long smoker presents with symptoms consistent with PNA and current UTI  D4 Rocephin, after completing 4 days of Avelox  Urine cultrue: Ecoli (pan-sensitive)  SCr: 2.03 (down from 2.15): CrCl: 36mL/min   Plan:   Continue ceftriaxone 1g IV Q 24 hours  Follow up renal function, labs, and cultures as available     Marijo Conception, Pharmacy Student 04/22/2011 1:35 PM    Lynann Beaver PharmD   Pager [phone] 04/22/2011 1:42 PM

## 2011-04-23 DIAGNOSIS — N390 Urinary tract infection, site not specified: Secondary | ICD-10-CM

## 2011-04-23 DIAGNOSIS — R042 Hemoptysis: Secondary | ICD-10-CM

## 2011-04-23 LAB — PRO B NATRIURETIC PEPTIDE: Pro B Natriuretic peptide (BNP): 8044 pg/mL — ABNORMAL HIGH (ref 0–125)

## 2011-04-23 LAB — CBC
MCHC: 32.8 g/dL (ref 30.0–36.0)
Platelets: 75 10*3/uL — ABNORMAL LOW (ref 150–400)
RDW: 14.5 % (ref 11.5–15.5)
WBC: 13.7 10*3/uL — ABNORMAL HIGH (ref 4.0–10.5)

## 2011-04-23 LAB — BASIC METABOLIC PANEL
Chloride: 100 mEq/L (ref 96–112)
Creatinine, Ser: 1.99 mg/dL — ABNORMAL HIGH (ref 0.50–1.35)
GFR calc Af Amer: 37 mL/min — ABNORMAL LOW (ref >90)
GFR calc non Af Amer: 32 mL/min — ABNORMAL LOW (ref >90)
Potassium: 5 mEq/L (ref 3.5–5.1)

## 2011-04-23 LAB — GLUCOSE, CAPILLARY
Glucose-Capillary: 125 mg/dL — ABNORMAL HIGH (ref 70–99)
Glucose-Capillary: 334 mg/dL — ABNORMAL HIGH (ref 70–99)

## 2011-04-23 MED ORDER — FUROSEMIDE 10 MG/ML IJ SOLN
60.0000 mg | Freq: Two times a day (BID) | INTRAMUSCULAR | Status: DC
  Administered 2011-04-24: 60 mg via INTRAVENOUS
  Filled 2011-04-23 (×2): qty 6

## 2011-04-23 MED ORDER — LEVOFLOXACIN 750 MG PO TABS
750.0000 mg | ORAL_TABLET | ORAL | Status: DC
  Administered 2011-04-23: 750 mg via ORAL
  Filled 2011-04-23: qty 1

## 2011-04-23 NOTE — Progress Notes (Signed)
Small amt. Of bloody sputum this morning.

## 2011-04-23 NOTE — Progress Notes (Signed)
LOS: 7 Requesting MD: Dr. Ranae [name] EDP Primary = [name] [name]  PCCM Progress Note   Brief patient profile:  71 yobm  Active smoker with CRI on ACEI to [facility] EDP  2/15 with 2 days of increasing DOE/SOB without fevers, chills, sweats or sputum production.  Placed on NIMVS for hypoxic/hypercarbic resp failure.  PCCM asked to admit    Micro/sepsis: MRSA screen > neg 2/15 bc x 2>>neg 2/15 uc>>ecoli>>ss roc 2/16 SPUTUM  >>nl flora Urine Strep ag 2/15 > neg Procalcitonin 2/15   0.36 .................. Urine culture 2/22 >>  Antibiotics: 2/15 Avelox(aecodp)>>2/18 2/18 roc(uti/aecopd)>>   Tests / Events: Trop 2.0 2/15 > added bisoprolol 2.5 mg daily am 2/16 plus asa  2/20 new onset scant hemoptysis. ECHO ef 60% but RV dilated  Overnight Feels better  Vital Signs: BP 120/70  Pulse 68  Temp(Src) 97.8 F (36.6 C) (Oral)  Resp 24  Ht 5\' 9"  (1.753 m)  Wt 259 lb 14.8 oz (117.9 kg)  BMI 38.38 kg/m2  SpO2 98%    Intake/Output Summary (Last 24 hours) at 04/23/11 1021 Last data filed at 04/23/11 0900  Gross per 24 hour  Intake   1420 ml  Output   5398 ml  Net  -3978 ml     Physical Examination: General:  MO AAM  nad sitting up Neuro:  intact   HEENT:  Short neck, no jvd Cardiovascular:  hsd rrr Lungs:  Decreased exp wheeze, Abdomen:  Obese, + bs Musculoskeletal:  Intact Skin:  Warm +lower ext edema     Labs and Imaging:  Ct Chest Wo Contrast  04/22/2011  *RADIOLOGY REPORT*  Clinical Data: Hemoptysis.  Clinical concern for a lung mass.  CT CHEST WITHOUT CONTRAST  Technique:  Multidetector CT imaging of the chest was performed following the standard protocol without IV contrast.  Comparison: Portable chest obtained earlier today.  Findings: Dense airspace opacity in the posterior aspect of the right lower lobe, with a patchy configuration.  Small amount of dense of airspace opacity in the posterior lateral aspect of the right middle lobe with some linear density. Linear  density in the left lower lobe.  Small right pleural effusion.  No enlarged lymph nodes.  Dense coronary artery calcifications.  Thoracic spine degenerative changes, including changes of DISH.  IMPRESSION:  1.  Right lower lobe and right middle lobe pneumonia. 2.  Small reactive right pleural effusion. 3.  Left lower lobe and right middle lobe linear atelectasis or scarring. 4.  No mass visualized. 5.  Dense coronary artery atheromatous calcifications.  Original Report Authenticated By: [name] [name], M.D.   Dg Chest Port 1 View  04/22/2011  *RADIOLOGY REPORT*  Clinical Data: Congestive heart failure.  PORTABLE CHEST - 1 VIEW  Comparison: 04/21/2011.  Findings: Thickening of the minor fissure.  Pulmonary vascular congestion.  This may represent mild congestive heart failure type changes.  Additionally, right base subtle consolidation.  This may represent pulmonary vascular prominence and crowding of vessels although subtle infiltrate not excluded.  No gross pneumothorax. Heart size top normal.  IMPRESSION: Pulmonary vascular prominence with small right-sided pleural effusion may represent changes of mild congestive heart failure.  Question subtle right base infiltrate  Original Report Authenticated By: [name] [name], M.D.      Lab 04/23/11 0305 04/22/11 0305 04/21/11 0305 04/16/11 1320  PROBNP 8044.0* 8503.0* 8197.0* 613.7*       Lab 04/23/11 0305 04/22/11 0305 04/21/11 0305  NA 139 140 139  K 5.0 5.3* 5.9*  CL 100 104 102  CO2 33* 29 29  BUN 69* 78* 84*  CREATININE 1.99* 2.03* 2.15*  GLUCOSE 157* 194* 281*    Lab 04/23/11 0305 04/22/11 0305 04/21/11 0305  HGB 18.9* 19.4* 18.4*  HCT 57.6* 59.1* 56.9*  WBC 13.7* 14.9* 13.0*  PLT 75* 91* 112*   ABG    Component Value Date/Time   PHART 7.349* 04/22/2011 0514   PCO2ART 59.6* 04/22/2011 0514   PO2ART 55.5* 04/22/2011 0514   HCO3 32.0* 04/22/2011 0514   TCO2 26.6 04/22/2011 0514   O2SAT 88.4 04/22/2011 0514    Assessment and  Plan: Hypoxic, 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 Volume overload on ACEI   Lab 04/22/11 0514 04/20/11 1745 04/16/11 1330  PHART 7.349* 7.283* 7.292*  PCO2ART 59.6* 66.0* 65.4*  PO2ART 55.5* 69.8* 80.5  HCO3 32.0* 30.3* 30.6*  TCO2 26.6 25.7 25.5  O2SAT 88.4 93.0 94.1   He is still acidotic but improved and more chronic now  PLAN - changed bd to xopenex 2/16 due to pos trop.   - s/p D/C ACEI 2/15 -steroids to continue -uds - check abg 2/21 and if hypercarbic do day time bipap  CRI base creatine 2.05   Lab 04/23/11 0305 04/22/11 0305 04/21/11 0305 04/16/11 1320  PROBNP 8044.0* 8503.0* 8197.0* 613.7*    Lab 04/23/11 0305 04/22/11 0305 04/21/11 0305 04/20/11 0304 04/19/11 0315  K 5.0 5.3* 5.9* 5.7* 5.9*    Lab Results  Component Value Date   CREATININE 1.99* 04/23/2011   CREATININE 2.03* 04/22/2011   CREATININE 2.15* 04/21/2011   D/C ACEI 2/15 START DIURESIS 2/20 due to rising BNP and K  Hemoptysis: 2/20 - ? Due to diastolic chf and rising bnp PLAN -continue  asa -pas -check coagsResults for [name] [name], [name] [name] (MRN [id number]) as of 04/22/2011 10:12  Ref. Range 04/22/2011 03:05  INR Latest Range: 0.00-1.49  1.06   Hematuria: -monitor. 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 Cause/ chronic Lab Results  Component Value Date   PLT 75* 04/23/2011   PLT 91* 04/22/2011   PLT 112* 04/21/2011   PLT 57* 06/09/2009   PLT 53* 04/14/2009   PLT 73* 01/01/2009   should be able to tol low dose asa on ppi bid  Hyperkalemia  Lab 04/23/11 0305 04/22/11 0305 04/21/11 0305 04/20/11 0304 04/19/11 0315  K 5.0 5.3* 5.9* 5.7* 5.9*  PLAN  -more aggressive glucose control  - 2/20 kaexlate and started diuresis  Positive cardiac enzymes likely due to stress /ACUTE DIASTOLIC CHF    Lab 04/17/11 1402 04/17/11 0745 04/17/11 0251 04/16/11 2051 04/16/11 1320  TROPONINI 1.72* 2.00* 1.73* 1.39* <0.30    Lab  04/23/11 0305 04/22/11 0305 04/21/11 0305 04/16/11 1320  PROBNP 8044.0* 8503.0* 8197.0* 613.7*   ECHO 2./20 - cor pulmonale and LVEF 60%  PLAN Rx asa/ bisoprolol if bp tolerates> titrated to 5 mg bid 2/18 -change resp rx to xopenex - DIURESISE AGGRESSIVELY starting 2/20   Intake/Output Summary (Last 24 hours) at 04/23/11 1917 Last data filed at 04/23/11 1800  Gross per 24 hour  Intake    980 ml  Output   4443 ml  Net  -3463 ml     Best practices / Disposition: -->ICU-SD status under PCCM -->full code -->PAS DVT Px (low plt) -->Protonix for GI Px        Steve Minor ACNP [facility] PCCM Pager [phone]  till 3 pm If no answer page [phone] 04/23/2011, 10:21 AM    STAFF NOTE: I, Dr Lavinia Sharps have personally reviewed patient's available data, including medical history, events of note, physical examination and test results as part of my evaluation. I have discussed with resident/NP and other care providers such as pharmacist, RN and RRT.  In addition,  I personally evaluated patient and elicited key findings of acute resp failure  And hemoptysis. CT shows RLL PNA and no mass. Duplex negative for DVT. So suspect hemotpysis is due to pna and chf. He is in negative balance with lasix; will continue aggressive diuresis; will increase lasix to 60mg  bid. Appears this is controling K and helping reduce creatinine.  Some intermittent hematuria earlier today: will dc foley but check urine culture.  Rest per NP/medical resident whose note is outlined above and that I agree with  Dr. Kalman Shan, M.D., [facility].C.P Pulmonary and Critical Care Medicine Staff Physician [facility] [place] Pulmonary and Critical Care Pager: [phone], If no answer or between  15:00h - 7:00h: call [phone]  04/23/2011 7:18 PM

## 2011-04-23 NOTE — Progress Notes (Signed)
No bloody sputum noted, however hematuria persists. Foley is properly secured to his leg.

## 2011-04-24 LAB — BASIC METABOLIC PANEL
CO2: 33 mEq/L — ABNORMAL HIGH (ref 19–32)
Chloride: 99 mEq/L (ref 96–112)
Creatinine, Ser: 1.87 mg/dL — ABNORMAL HIGH (ref 0.50–1.35)
GFR calc Af Amer: 39 mL/min — ABNORMAL LOW (ref >90)
Potassium: 4.8 mEq/L (ref 3.5–5.1)
Sodium: 138 mEq/L (ref 135–145)

## 2011-04-24 LAB — CBC
MCV: 92.5 fL (ref 78.0–100.0)
Platelets: 68 10*3/uL — ABNORMAL LOW (ref 150–400)
RBC: 6.29 MIL/uL — ABNORMAL HIGH (ref 4.22–5.81)
WBC: 15.5 10*3/uL — ABNORMAL HIGH (ref 4.0–10.5)

## 2011-04-24 LAB — GLUCOSE, CAPILLARY
Glucose-Capillary: 310 mg/dL — ABNORMAL HIGH (ref 70–99)
Glucose-Capillary: 245 mg/dL — ABNORMAL HIGH (ref 70–99)
Glucose-Capillary: 202 mg/dL — ABNORMAL HIGH (ref 70–99)

## 2011-04-24 LAB — PHOSPHORUS: Phosphorus: 4.1 mg/dL (ref 2.3–4.6)

## 2011-04-24 LAB — PRO B NATRIURETIC PEPTIDE: Pro B Natriuretic peptide (BNP): 3635 pg/mL — ABNORMAL HIGH (ref 0–125)

## 2011-04-24 LAB — MAGNESIUM: Magnesium: 1.9 mg/dL (ref 1.5–2.5)

## 2011-04-24 MED ORDER — LEVOFLOXACIN IN D5W 750 MG/150ML IV SOLN
750.0000 mg | INTRAVENOUS | Status: DC
  Administered 2011-04-25 – 2011-04-27 (×2): 750 mg via INTRAVENOUS
  Filled 2011-04-24 (×2): qty 150

## 2011-04-24 MED ORDER — PREDNISONE 20 MG PO TABS
40.0000 mg | ORAL_TABLET | Freq: Every day | ORAL | Status: DC
  Administered 2011-04-25 – 2011-04-28 (×4): 40 mg via ORAL
  Filled 2011-04-24 (×4): qty 2

## 2011-04-24 MED ORDER — FUROSEMIDE 10 MG/ML IJ SOLN
80.0000 mg | Freq: Two times a day (BID) | INTRAMUSCULAR | Status: DC
  Administered 2011-04-24 – 2011-04-27 (×6): 80 mg via INTRAVENOUS
  Filled 2011-04-24 (×7): qty 8

## 2011-04-24 NOTE — Progress Notes (Signed)
ADmit date 04/16/2011  LOS: 8 Requesting MD: Dr. 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  Placed on NIMVS for hypoxic/hypercarbic resp failure.  PCCM asked to admit. Office patient of [name] for COPD    Micro/sepsis: MRSA screen > neg 2/15 bc x 2>>neg 2/15 uc>>ecoli>>ss roc and quinolones. Entereococcus - sensitive to ampi and levoflox 2/16 SPUTUM  >>nl flora Urine Strep ag 2/15 > neg Procalcitonin 2/15   0.36 .................. Urine culture 2/22 >>  Results for orders placed during the hospital encounter of 04/16/11  CULTURE, BLOOD (ROUTINE X 2)     Status: Normal   Collection Time   04/16/11  1:20 PM      Component Value Range Status Comment   Specimen Description BLOOD RIGHT HAND   Final    Special Requests BOTTLES DRAWN AEROBIC AND ANAEROBIC    Final    Culture  Setup Time [id number]   Final    Culture NO GROWTH 5 DAYS   Final    Report Status 04/22/2011 FINAL   Final   CULTURE, BLOOD (ROUTINE X 2)     Status: Normal   Collection Time   04/16/11  1:29 PM      Component Value Range Status Comment   Specimen Description BLOOD RIGHT ARM   Final    Special Requests BOTTLES DRAWN AEROBIC AND ANAEROBIC    Final    Culture  Setup Time [id number]   Final    Culture NO GROWTH 5 DAYS   Final    Report Status 04/22/2011 FINAL   Final   URINE CULTURE     Status: Normal   Collection Time   04/16/11  2:33 PM      Component Value Range Status Comment   Specimen Description URINE, CATHETERIZED   Final    Special Requests NONE   Final    Culture  Setup Time [id number]   Final    Colony Count 95,000 COLONIES/ML   Final    Culture     Final    Value: ESCHERICHIA COLI     ENTEROCOCCUS SPECIES   Report Status 04/20/2011 FINAL   Final    Organism ID, Bacteria ESCHERICHIA COLI   Final    Organism ID, Bacteria ENTEROCOCCUS SPECIES   Final   MRSA PCR SCREENING     Status: Normal   Collection Time   04/16/11 10:06 PM      Component Value Range Status Comment   MRSA by PCR NEGATIVE  NEGATIVE  Final   CULTURE, SPUTUM-ASSESSMENT     Status: Normal   Collection Time   04/17/11  7:28 AM      Component Value Range Status Comment   Specimen Description SPUTUM   Final    Special Requests Normal   Final    Sputum evaluation     Final    Value: THIS SPECIMEN IS ACCEPTABLE. RESPIRATORY CULTURE REPORT TO FOLLOW.   Report Status 04/17/2011 FINAL   Final   CULTURE, RESPIRATORY     Status: Normal   Collection Time   04/17/11  7:28 AM      Component Value Range Status Comment   Specimen Description SPUTUM   Final    Special Requests NONE   Final    Gram Stain  Final    Value: NO WBC SEEN     NO SQUAMOUS EPITHELIAL CELLS SEEN     RARE GRAM POSITIVE COCCI IN PAIRS   Culture NORMAL OROPHARYNGEAL FLORA   Final    Report Status 04/19/2011 FINAL   Final      Antibiotics: 2/15 Avelox(aecodp)>>2/18 2/18 roc(uti/aecopd)>>2/22 2/22 PO levoflox (dual uti) >>  Anti-infectives     Start     Dose/Rate Route Frequency Ordered Stop   04/23/11 1500   levofloxacin (LEVAQUIN) tablet 750 mg        750 mg Oral Every 48 hours 04/23/11 1333     04/19/11 1400   cefTRIAXone (ROCEPHIN) 1 g in dextrose 5 % 50 mL IVPB  Status:  Discontinued        1 g 100 mL/hr over 30 Minutes Intravenous Every 24 hours 04/19/11 1306 04/23/11 1331   04/18/11 1800   moxifloxacin (AVELOX) tablet 400 mg  Status:  Discontinued        400 mg Oral Daily-1800 04/18/11 0915 04/19/11 1306   04/16/11 1400   moxifloxacin (AVELOX) IVPB 400 mg  Status:  Discontinued        400 mg 250 mL/hr over 60 Minutes Intravenous Every 24 hours 04/16/11 1345 04/18/11 0915            Tests / Events: Trop 2.0 2/15 > added bisoprolol 2.5 mg daily am 2/16 plus asa  2/20 new onset scant hemoptysis. ECHO ef 60% but RV  dilated  Overnight Feels better but still having intermittent small amount 10cc total volume hemoptysis in 24h Also with intermittent hematurai despite dc foley on 2/22 Otherwise well. Desaturated with cpap at night  Vital Signs: BP 134/67  Pulse 73  Temp(Src) 97.4 F (36.3 C) (Oral)  Resp 24  Ht 5\' 9"  (1.753 m)  Wt 116.6 kg (257 lb 0.9 oz)  BMI 37.96 kg/m2  SpO2 91%    Intake/Output Summary (Last 24 hours) at 04/24/11 1217 Last data filed at 04/24/11 0800  Gross per 24 hour  Intake    720 ml  Output   1360 ml  Net   -640 ml     Physical Examination: General:  MO AAM  nad sitting up Neuro:  intact   HEENT:  Short neck, no jvd Cardiovascular:  hsd rrr Lungs:  Decreased exp wheeze, Abdomen:  Obese, + bs Musculoskeletal:  Intact Skin:  Warm +lower ext edema     Labs and Imaging:  Ct Chest Wo Contrast  04/22/2011  *RADIOLOGY REPORT*  Clinical Data: Hemoptysis.  Clinical concern for a lung mass.  CT CHEST WITHOUT CONTRAST  Technique:  Multidetector CT imaging of the chest was performed following the standard protocol without IV contrast.  Comparison: Portable chest obtained earlier today.  Findings: Dense airspace opacity in the posterior aspect of the right lower lobe, with a patchy configuration.  Small amount of dense of airspace opacity in the posterior lateral aspect of the right middle lobe with some linear density. Linear density in the left lower lobe.  Small right pleural effusion.  No enlarged lymph nodes.  Dense coronary artery calcifications.  Thoracic spine degenerative changes, including changes of DISH.  IMPRESSION:  1.  Right lower lobe and right middle lobe pneumonia. 2.  Small reactive right pleural effusion. 3.  Left lower lobe and right middle lobe linear atelectasis or scarring. 4.  No mass visualized. 5.  Dense coronary artery atheromatous calcifications.  Original Report Authenticated By: Darrol Angel, M.D.  Lab 04/24/11 0340 04/23/11 0305  04/22/11 0305 04/21/11 0305  PROBNP 3635.0* 8044.0* 8503.0* 8197.0*       Lab 04/24/11 0340 04/23/11 0305 04/22/11 0305  NA 138 139 140  K 4.8 5.0 5.3*  CL 99 100 104  CO2 33* 33* 29  BUN 66* 69* 78*  CREATININE 1.87* 1.99* 2.03*  GLUCOSE 129* 157* 194*    Lab 04/24/11 0340 04/23/11 0305 04/22/11 0305  HGB 18.8* 18.9* 19.4*  HCT 58.2* 57.6* 59.1*  WBC 15.5* 13.7* 14.9*  PLT 68* 75* 91*   ABG    Component Value Date/Time   PHART 7.349* 04/22/2011 0514   PCO2ART 59.6* 04/22/2011 0514   PO2ART 55.5* 04/22/2011 0514   HCO3 32.0* 04/22/2011 0514   TCO2 26.6 04/22/2011 0514   O2SAT 88.4 04/22/2011 0514    Assessment and Plan: Hypoxic, 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 Volume overload on ACEI   Lab 04/22/11 0514 04/20/11 1745  PHART 7.349* 7.283*  PCO2ART 59.6* 66.0*  PO2ART 55.5* 69.8*  HCO3 32.0* 30.3*  TCO2 26.6 25.7  O2SAT 88.4 93.0   He is still acidotic but improved and more chronic now as of 2/21  PLAN - xopenex 2/16 due to pos trop.   - -steroids to continue but change to po on 04/24/11 -cotninue nocturnal auto bipap  CRI base creatine 2.05 s/p D/C ACEI 2/15  Lab 04/24/11 0340 04/23/11 0305 04/22/11 0305 04/21/11 0305  PROBNP 3635.0* 8044.0* 8503.0* 8197.0*    Lab 04/24/11 0340 04/23/11 0305 04/22/11 0305 04/21/11 0305 04/20/11 0304  K 4.8 5.0 5.3* 5.9* 5.7*    Lab Results  Component Value Date   CREATININE 1.87* 04/24/2011   CREATININE 1.99* 04/23/2011   CREATININE 2.03* 04/22/2011   Cotninue DIURESIS since 2/20 due to rising BNP and K: appears to be helping Change solumedrol to prednisone to help with high K Check BNP prn  Hemoptysis: 2/20, still present as of 04/24/11 - ? Due to diastolic chf and rising bnp and RLL PNA on CT 2/22. Duplex Leg negative 2/21 PLAN -continue  Asa for now -pas - repeat doppler legs (serial) on 2/24 or 2/25  Hematuria: -monitor. 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 Cause/ chronic Lab Results  Component Value Date   PLT 68* 04/24/2011   PLT 75* 04/23/2011   PLT 91* 04/22/2011   PLT 57* 06/09/2009   PLT 53* 04/14/2009   PLT 73* 01/01/2009   should be able to tol low dose asa on ppi bid SCDs Check HIT panel 2/24  Hyperkalemia  Lab 04/24/11 0340 04/23/11 0305 04/22/11 0305 04/21/11 0305 04/20/11 0304  K 4.8 5.0 5.3* 5.9* 5.7*  PLAN  -more aggressive glucose control  - 2/20 kaexlate and started diuresis - change to po prednisone 2/23  Positive cardiac enzymes likely due to stress /ACUTE DIASTOLIC CHF. ECHO 2./20 - cor pulmonale and LVEF 60%    Lab 04/17/11 1402  TROPONINI 1.72*    Lab 04/24/11 0340 04/23/11 0305 04/22/11 0305 04/21/11 0305  PROBNP 3635.0* 8044.0* 8503.0* 8197.0*     PLAN Rx asa/ bisoprolol if bp tolerates> titrated to 5 mg bid 2/18 -change resp rx to xopenex - DIURESISE AGGRESSIVELY starting 2/20   Intake/Output Summary (Last 24 hours) at 04/24/11 1217 Last data filed at 04/24/11 0800  Gross per 24 hour  Intake  720 ml  Output   1360 ml  Net   -640 ml     Best practices / Disposition: --> move to tele floor status under PCCM on 04/24/11 -->full code -->PAS DVT Px (low plt) -->Protonix for GI Px       Dr. Kalman Shan, M.D., [facility].C.P Pulmonary and Critical Care Medicine Staff Physician [facility] [place] Pulmonary and Critical [facility] Pager: [phone], If no answer or between  15:00h - 7:00h: call [phone]  04/24/2011 12:17 PM

## 2011-04-24 NOTE — Progress Notes (Signed)
ANTIBIOTIC CONSULT NOTE - FOLLOW UP  Pharmacy Consult for Levaquin Indication: UTI/PNA No Known Allergies  Patient Measurements: Height: 5\' 9"  (175.3 cm) Weight: 257 lb 0.9 oz (116.6 kg) IBW/kg (Calculated) : 70.7    Vital Signs: Temp: 98.3 F (36.8 C) (02/23 1200) Temp src: Oral (02/23 1200) BP: 110/60 mmHg (02/23 1200) Pulse Rate: 72  (02/23 1200) Intake/Output from previous day: 02/22 0701 - 02/23 0700 In: 1000 [P.O.:1000] Out: 2565 [Urine:2565] Intake/Output from this shift: Total I/O In: 600 [P.O.:600] Out: 750 [Urine:750]  Labs:  [facility] 04/24/11 0340 04/23/11 0305 04/22/11 0305  WBC 15.5* 13.7* 14.9*  HGB 18.8* 18.9* 19.4*  PLT 68* 75* 91*  LABCREA -- -- --  CREATININE 1.87* 1.99* 2.03*   Estimated Creatinine Clearance: 44.3 ml/min (by C-G formula based on Cr of 1.87).    Microbiology: Recent Results (from the past 720 hour(s))  CULTURE, BLOOD (ROUTINE X 2)     Status: Normal   Collection Time   04/16/11  1:20 PM      Component Value Range Status Comment   Specimen Description BLOOD RIGHT HAND   Final    Special Requests BOTTLES DRAWN AEROBIC AND ANAEROBIC    Final    Culture  Setup Time [id number]   Final    Culture NO GROWTH 5 DAYS   Final    Report Status 04/22/2011 FINAL   Final   CULTURE, BLOOD (ROUTINE X 2)     Status: Normal   Collection Time   04/16/11  1:29 PM      Component Value Range Status Comment   Specimen Description BLOOD RIGHT ARM   Final    Special Requests BOTTLES DRAWN AEROBIC AND ANAEROBIC    Final    Culture  Setup Time [id number]   Final    Culture NO GROWTH 5 DAYS   Final    Report Status 04/22/2011 FINAL   Final   URINE CULTURE     Status: Normal   Collection Time   04/16/11  2:33 PM      Component Value Range Status Comment   Specimen Description URINE, CATHETERIZED   Final    Special Requests NONE   Final    Culture  Setup Time [id number]   Final    Colony Count 95,000 COLONIES/ML   Final    Culture      Final    Value: ESCHERICHIA COLI     ENTEROCOCCUS SPECIES   Report Status 04/20/2011 FINAL   Final    Organism ID, Bacteria ESCHERICHIA COLI   Final    Organism ID, Bacteria ENTEROCOCCUS SPECIES   Final   MRSA PCR SCREENING     Status: Normal   Collection Time   04/16/11 10:06 PM      Component Value Range Status Comment   MRSA by PCR NEGATIVE  NEGATIVE  Final   CULTURE, SPUTUM-ASSESSMENT     Status: Normal   Collection Time   04/17/11  7:28 AM      Component Value Range Status Comment   Specimen Description SPUTUM   Final    Special Requests Normal   Final    Sputum evaluation     Final    Value: THIS SPECIMEN IS ACCEPTABLE. RESPIRATORY CULTURE REPORT TO FOLLOW.   Report Status 04/17/2011 FINAL   Final   CULTURE, RESPIRATORY     Status: Normal   Collection Time   04/17/11  7:28 AM      Component Value Range Status  Comment   Specimen Description SPUTUM   Final    Special Requests NONE   Final    Gram Stain     Final    Value: NO WBC SEEN     NO SQUAMOUS EPITHELIAL CELLS SEEN     RARE GRAM POSITIVE COCCI IN PAIRS   Culture NORMAL OROPHARYNGEAL FLORA   Final    Report Status 04/19/2011 FINAL   Final     Anti-infectives     Start     Dose/Rate Route Frequency Ordered Stop   04/23/11 1500   levofloxacin (LEVAQUIN) tablet 750 mg  Status:  Discontinued        750 mg Oral Every 48 hours 04/23/11 1333 04/24/11 1228   04/19/11 1400   cefTRIAXone (ROCEPHIN) 1 g in dextrose 5 % 50 mL IVPB  Status:  Discontinued        1 g 100 mL/hr over 30 Minutes Intravenous Every 24 hours 04/19/11 1306 04/23/11 1331   04/18/11 1800   moxifloxacin (AVELOX) tablet 400 mg  Status:  Discontinued        400 mg Oral Daily-1800 04/18/11 0915 04/19/11 1306   04/16/11 1400   moxifloxacin (AVELOX) IVPB 400 mg  Status:  Discontinued        400 mg 250 mL/hr over 60 Minutes Intravenous Every 24 hours 04/16/11 1345 04/18/11 0915          Assessment:  73 YOF adm 2/15 w/worsenting respiratory sx (acute  on chronic resp failure)  Day # 2 Levaquin po 750mg  q48h for UTI/PNA. Now to change to IV.  Urine cx 2/15 with Ecoli and Enterococcus, both sens to levaquin  Previous ABX: Avelox 2/15-18, Rocephin 2/18-2/22  Scr improved, CrCl 44  Goal of Therapy:  Appropriate renal dose of Levaquin  Plan:   Levaquin 750mg  IV q48h  Follow Scr and adjust as necessary  F/U repeat Urine cx.  Gwen Her PharmD  [phone] 04/24/2011 1:16 PM

## 2011-04-25 DIAGNOSIS — J441 Chronic obstructive pulmonary disease with (acute) exacerbation: Secondary | ICD-10-CM

## 2011-04-25 DIAGNOSIS — I5031 Acute diastolic (congestive) heart failure: Secondary | ICD-10-CM

## 2011-04-25 DIAGNOSIS — R0602 Shortness of breath: Secondary | ICD-10-CM

## 2011-04-25 DIAGNOSIS — R042 Hemoptysis: Secondary | ICD-10-CM

## 2011-04-25 LAB — BASIC METABOLIC PANEL
BUN: 67 mg/dL — ABNORMAL HIGH (ref 6–23)
Calcium: 9.3 mg/dL (ref 8.4–10.5)
Creatinine, Ser: 2.34 mg/dL — ABNORMAL HIGH (ref 0.50–1.35)
GFR calc Af Amer: 30 mL/min — ABNORMAL LOW (ref >90)

## 2011-04-25 LAB — GLUCOSE, CAPILLARY
Glucose-Capillary: 153 mg/dL — ABNORMAL HIGH (ref 70–99)
Glucose-Capillary: 277 mg/dL — ABNORMAL HIGH (ref 70–99)
Glucose-Capillary: 281 mg/dL — ABNORMAL HIGH (ref 70–99)
Glucose-Capillary: 284 mg/dL — ABNORMAL HIGH (ref 70–99)

## 2011-04-25 LAB — URINE CULTURE: Colony Count: NO GROWTH

## 2011-04-25 LAB — PRO B NATRIURETIC PEPTIDE: Pro B Natriuretic peptide (BNP): 2284 pg/mL — ABNORMAL HIGH (ref 0–125)

## 2011-04-25 MED ORDER — INSULIN GLARGINE 100 UNIT/ML ~~LOC~~ SOLN
18.0000 [IU] | Freq: Two times a day (BID) | SUBCUTANEOUS | Status: DC
  Administered 2011-04-25 – 2011-04-28 (×6): 18 [IU] via SUBCUTANEOUS
  Filled 2011-04-25: qty 3

## 2011-04-25 NOTE — Progress Notes (Signed)
VASCULAR LAB PRELIMINARY  PRELIMINARY  PRELIMINARY  PRELIMINARY  Bilateral lower extremity venous Dopplers completed.    Preliminary report:  There is no obvious evidence of DVT or SVT noted in the bilateral lower extremities.  Sherren Kerns [place], 04/25/2011, 9:54 AM

## 2011-04-25 NOTE — Progress Notes (Signed)
Subjective: Pt in no distress or increased wob.  No hemoptysis today.  Is having blood from penis though.  No issues with cpap at night.   Objective: Vital signs in last 24 hours: Blood pressure 136/77, pulse 77, temperature 97.3 F (36.3 C), temperature source Oral, resp. rate 22, height 5\' 9"  (1.753 m), weight 116.6 kg (257 lb 0.9 oz), SpO2 95.00%.  Intake/Output from previous day: 02/23 0701 - 02/24 0700 In: 608 [P.O.:600; IV Piggyback:8] Out: 750 [Urine:750]   Physical Exam:   obese male in nad Chest with mild decrease in bs, no wheezing Cor with rrr abd benign LE with mild edema, no cyanosis  Alert and oriented, moves all 4.    Lab Results:  Basename 04/24/11 0340 04/23/11 0305  WBC 15.5* 13.7*  HGB 18.8* 18.9*  HCT 58.2* 57.6*  PLT 68* 75*   BMET  Basename 04/25/11 0555 04/24/11 0340 04/23/11 0305  NA 137 138 139  K 5.0 4.8 5.0  CL 95* 99 100  CO2 37* 33* 33*  GLUCOSE 174* 129* 157*  BUN 67* 66* 69*  CREATININE 2.34* 1.87* 1.99*  CALCIUM 9.3 9.3 9.4    Studies/Results: No results found.  Assessment/Plan: Patient Active Hospital Problem List:  Acute respiratory failure presumed secondary to copd exacerbation and OHS/OSA Pt stable currently with no acute bronchospasm.  Wearing cpap at HS.  Will taper prednisone as tolerated.   Hemoptysis felt secondary to PNA/CHF.  No dvt by multiple doppler exams.  Denies hemoptysis today.  Chronic renal insuff.  No new labwork today.  Will check in am.   Hematuria.  He has blood dripping from penis today.  He will need urology consult in am.   He has seen Dr. Vonita Moss before.    DM.  Blood sugars continue to be elevated.  Will increase lantus in am and pm.  Should improve with tapering prednisone.   Thrombocytopenia.  Will recheck in am.      Barbaraann Share, M.D. 04/25/2011, 1:05 PM

## 2011-04-26 DIAGNOSIS — J9600 Acute respiratory failure, unspecified whether with hypoxia or hypercapnia: Secondary | ICD-10-CM

## 2011-04-26 LAB — BASIC METABOLIC PANEL
Calcium: 9 mg/dL (ref 8.4–10.5)
Chloride: 96 mEq/L (ref 96–112)
Creatinine, Ser: 2.22 mg/dL — ABNORMAL HIGH (ref 0.50–1.35)
GFR calc Af Amer: 32 mL/min — ABNORMAL LOW (ref >90)
GFR calc non Af Amer: 28 mL/min — ABNORMAL LOW (ref >90)

## 2011-04-26 LAB — CBC
HCT: 55 % — ABNORMAL HIGH (ref 39.0–52.0)
Platelets: 65 10*3/uL — ABNORMAL LOW (ref 150–400)
RDW: 14.2 % (ref 11.5–15.5)
WBC: 14.4 10*3/uL — ABNORMAL HIGH (ref 4.0–10.5)

## 2011-04-26 LAB — GLUCOSE, CAPILLARY: Glucose-Capillary: 201 mg/dL — ABNORMAL HIGH (ref 70–99)

## 2011-04-26 NOTE — Progress Notes (Signed)
Nutrition Brief Note:  Per dietary ambassador, pt with increased hunger and requesting additional foods at meals.  Pt is consuming 100% of his meals.  Upon review of pt anthropometric measures, CHO Modified HIGH would be more appropriate for pt.  Noted pt with increased blood glucose.  CHO Mod HIGH diet would increase CHO intake by 15g/meal.  MD adjusting lantus and tapering prednisone.  [name] [name] Pager: [phone], covering [address]: [phone]

## 2011-04-26 NOTE — Progress Notes (Signed)
LOS: 10 Requesting MD: Dr. Ranae Palms EDP Primary = [name]  PCCM Progress Note   Brief patient profile:  71 yobm  Active smoker with CRI on ACEI to [facility] EDP  2/15 with 2 days of increasing DOE/SOB without fevers, chills, sweats or sputum production.  Placed on NIMVS for hypoxic/hypercarbic resp failure.  PCCM asked to admit. Office patient of [name] for COPD    Micro/sepsis: MRSA screen > neg 2/15 bc x 2>>neg 2/15 uc>>ecoli>>ss roc and quinolones. Entereococcus - sensitive to ampi and levoflox 2/16 SPUTUM  >>nl flora Urine Strep ag 2/15 > neg Procalcitonin 2/15   0.36  Urine culture 2/22 >>  Antibiotics: 2/15 Avelox(aecodp)>>2/18 2/18 roc(uti/aecopd)>>2/22 2/22 PO levoflox (dual uti) >>   Tests / Events: Trop 2.0 2/15 > added bisoprolol 2.5 mg daily am 2/16 plus asa  2/20 new onset scant hemoptysis. ECHO ef 60% but RV dilated  Overnight In bathroom on rounds on floor, "doing fine, wants to go home"  Vital Signs: BP 114/65  Pulse 77  Temp(Src) 97.9 F (36.6 C) (Oral)  Resp 18  Ht 5\' 9"  (1.753 m)  Wt 253 lb 1.9 oz (114.814 kg)  BMI 37.38 kg/m2  SpO2 94%    Intake/Output Summary (Last 24 hours) at 04/26/11 1623 Last data filed at 04/26/11 1200  Gross per 24 hour  Intake    600 ml  Output    950 ml  Net   -350 ml     Physical Examination: General:  MO AAM  nad sitting up Neuro:  intact   HEENT:  Short neck, no jvd Cardiovascular:  hsd rrr Lungs:  Decreased exp wheeze, Abdomen:  Obese, + bs Musculoskeletal:  Intact Skin:  Warm +lower ext edema     Labs and Imaging:  No results found.    Lab 04/25/11 0555 04/24/11 0340 04/23/11 0305 04/22/11 0305 04/21/11 0305  PROBNP 2284.0* 3635.0* 8044.0* 8503.0* 8197.0*       Lab 04/26/11 0440 04/25/11 0555 04/24/11 0340  NA 137 137 138  K 4.9 5.0 4.8  CL 96 95* 99  CO2 34* 37* 33*  BUN 72* 67* 66*  CREATININE 2.22* 2.34* 1.87*  GLUCOSE 253* 174* 129*    Lab 04/26/11 0440 04/24/11 0340 04/23/11 0305    HGB 17.8* 18.8* 18.9*  HCT 55.0* 58.2* 57.6*  WBC 14.4* 15.5* 13.7*  PLT 65* 68* 75*   ABG    Component Value Date/Time   PHART 7.349* 04/22/2011 0514   PCO2ART 59.6* 04/22/2011 0514   PO2ART 55.5* 04/22/2011 0514   HCO3 32.0* 04/22/2011 0514   TCO2 26.6 04/22/2011 0514   O2SAT 88.4 04/22/2011 0514    Assessment and Plan: Hypoxic, 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 Volume overload on ACEI   Lab 04/22/11 0514 04/20/11 1745  PHART 7.349* 7.283*  PCO2ART 59.6* 66.0*  PO2ART 55.5* 69.8*  HCO3 32.0* 30.3*  TCO2 26.6 25.7  O2SAT 88.4 93.0      PLAN - xopenex 2/16 due to pos trop.   - -steroids to continue but changed to po on 2/23   -cotninue nocturnal auto bipap   CRI base creatine 2.05 s/p D/C ACEI 2/15  Lab 04/25/11 0555 04/24/11 0340 04/23/11 0305 04/22/11 0305 04/21/11 0305  PROBNP 2284.0* 3635.0* 8044.0* 8503.0* 8197.0*    Lab 04/26/11 0440 04/25/11 0555 04/24/11 0340 04/23/11 0305 04/22/11 0305  K 4.9 5.0 4.8 5.0 5.3*    Lab Results  Component Value Date  CREATININE 2.22* 04/26/2011   CREATININE 2.34* 04/25/2011   CREATININE 1.87* 04/24/2011   Cotninue DIURESIS since 2/20 due to rising BNP and K: appears to be helping Changedsolumedrol to prednisone to help with high K    Hemoptysis: 2/20, still present as of 04/24/11 - ? Due to diastolic chf and rising bnp and RLL PNA on CT 2/22. Duplex Leg negative 2/21 PLAN -continue  Asa for now -pas    Hematuria: -monitor. -  repeat urine culture 2/22 > neg - change levoflox to IV on 2/23   DM -ssi  SZ DO -cont home meds  Thrombocytopenia ? Cause/ chronic Lab Results  Component Value Date   PLT 65* 04/26/2011   PLT 68* 04/24/2011   PLT 75* 04/23/2011   PLT 57* 06/09/2009   PLT 53* 04/14/2009   PLT 73* 01/01/2009   should be able to tol low dose asa on ppi bid SCDs   HIT panel 2/24 >>>  Hyperkalemia  Lab 04/26/11 0440 04/25/11 0555 04/24/11 0340 04/23/11  0305 04/22/11 0305  K 4.9 5.0 4.8 5.0 5.3*  PLAN  -more aggressive glucose control  - 2/20 kaexlate and started diuresis - changed to po prednisone 2/23  Positive cardiac enzymes likely due to stress /ACUTE DIASTOLIC CHF. ECHO 2./20 - cor pulmonale and LVEF 60%   No results found for this basename: TROPONINI:5 in the last 168 hours  Lab 04/25/11 0555 04/24/11 0340 04/23/11 0305 04/22/11 0305 04/21/11 0305  PROBNP 2284.0* 3635.0* 8044.0* 8503.0* 8197.0*     PLAN Rx asa/ bisoprolol if bp tolerates> titrated to 5 mg bid 2/18 -change resp rx to xopenex - DIURESISE AGGRESSIVELY starting 2/20   Intake/Output Summary (Last 24 hours) at 04/26/11 1623 Last data filed at 04/26/11 1200  Gross per 24 hour  Intake    600 ml  Output    950 ml  Net   -350 ml     Best practices / Disposition: --> moved to tele floor status under PCCM on 2/23  -->full code -->PAS DVT Px (low plt) -->Protonix for GI Px --> ? Home 2/26     Sandrea Hughs, MD Pulmonary and Critical Care Medicine [facility] Cell [phone]

## 2011-04-27 ENCOUNTER — Inpatient Hospital Stay (HOSPITAL_COMMUNITY): Payer: Medicare Other

## 2011-04-27 DIAGNOSIS — J9600 Acute respiratory failure, unspecified whether with hypoxia or hypercapnia: Secondary | ICD-10-CM

## 2011-04-27 DIAGNOSIS — J441 Chronic obstructive pulmonary disease with (acute) exacerbation: Secondary | ICD-10-CM

## 2011-04-27 DIAGNOSIS — I5031 Acute diastolic (congestive) heart failure: Secondary | ICD-10-CM

## 2011-04-27 DIAGNOSIS — R042 Hemoptysis: Secondary | ICD-10-CM

## 2011-04-27 LAB — GLUCOSE, CAPILLARY
Glucose-Capillary: 242 mg/dL — ABNORMAL HIGH (ref 70–99)
Glucose-Capillary: 371 mg/dL — ABNORMAL HIGH (ref 70–99)

## 2011-04-27 MED ORDER — FUROSEMIDE 10 MG/ML IJ SOLN
80.0000 mg | Freq: Four times a day (QID) | INTRAMUSCULAR | Status: AC
  Administered 2011-04-27: 80 mg via INTRAVENOUS
  Filled 2011-04-27 (×2): qty 8

## 2011-04-27 NOTE — Discharge Summary (Signed)
Physician Discharge Summary  Patient ID: [name] [name] MRN: [id number] DOB/AGE: 09-26-1940 71 y.o.  Admit date: 04/16/2011 Discharge date: 04/28/2011  Admission Diagnoses: Acute on Chronic respiratory failure  Discharge Diagnoses:  Active Problems:  DIABETES MELLITUS, TYPE II  HYPERLIPIDEMIA  Morbid obesity  THROMBOCYTOPENIA  HYPERTENSION  Chronic respiratory failure  SEIZURE DISORDER  SLEEP APNEA  Acute exacerbation of chronic obstructive pulmonary disease (COPD)  Purulent bronchitis  Pulmonary edema  Cor pulmonale  Obesity hypoventilation syndrome  Micro/sepsis:  MRSA screen > neg  2/15 bc x 2>>neg  2/15 uc>>ecoli>>ss roc and quinolones. Entereococcus - sensitive to ampi and levoflox  2/16 SPUTUM >>nl flora  Urine Strep ag 2/15 > neg  Procalcitonin 2/15 0.36  Urine culture 2/22 >>neg  Antibiotics:  2/15 Avelox(aecodp)>>2/18  2/18 roc(uti/aecopd)>>2/22  2/22 PO levoflox (dual uti) >>  Tests / Events:  Trop 2.0 2/15 > added bisoprolol 2.5 mg daily am 2/16 plus asa  2/20 new onset scant hemoptysis. ECHO ef 60% but RV dilated   Brief patient profile:  71 yobm Active smoker with CRI on ACEI to [facility] EDP 2/15 with 2 days of increasing DOE/SOB without fevers, chills, sweats or sputum production. Placed on NIMVS for hypoxic/hypercarbic resp failure. PCCM asked to admit. Office patient of [name] for COPD  Hospital Course:  Acute on Chronic resp failure in the setting of ongoing tobacco abuse, aecopd w/ purulent bronchitis, osa and Volume overload / pulmonary edema w/ decompensated cor pulmonale on ACEI:  Admitted initially to the intensive care. Therapeutic interventions have included: BIPAP, supplemental oxygen, scheduled BDs, aggressive diuresis, discontinuation of ace-inhibitor and empiric antibiotics. A large degree of his acute on chronic illness can most-likely be contributed to medical non-compliance and persistent active smoking. He has been strongly urged to stop. He  has made moderate improvements over the course of his stay however at time of discharge still requires significant Oxygen support. He will be discharged on escalated dosing of lasix, his CPAP has been changed to auto-set BIPAP given his chronic hypercarbic respiratory component of his disease. We will send him home on slow prednisone taper and he will complete 3 more days of antibiotics.   PLAN  - xopenex and atrovent q 6 d/t positive troponins - taper steroids to off - home with  nocturnal auto bipap  -diurese increased -home on 5 liters N/C -will need walking oximetry check in the office f/u visit -complete 3 more days of levaquin  CRI base creatine 2.05: s/p D/C ACEI 2/15  Recent Labs  Basename 04/28/11 0454 04/26/11 0440   CREATININE 2.12* 2.22*  Plan:  -f/u chemistry, diuretics adjusted.  -no more Ace-I  Hemoptysis: 2/20, still present as of 04/27/11  - ? Due to diastolic chf and rising bnp and RLL PNA on CT 2/22. Duplex Leg negative 2/21  PLAN  -hold asa  -cont abx   Hematuria (resolved)   DM  CBG (last 3)   Basename 04/28/11 0717 04/27/11 2107 04/27/11 1710  GLUCAP 257* 360* 371*  plan:  -back to home dosing  -decrease steroids   SZ DO  -cont home meds   Thrombocytopenia ? Cause/ chronic   Lab 04/28/11 0454 04/26/11 0440 04/24/11 0340  PLT 51* 65* 68*  plan: -HIT panel 2/24 >> POSITIVE  -Follow up CBC on 3/4   Hyperkalemia   Lab 04/28/11 0454 04/26/11 0440 04/25/11 0555  K 5.2* 4.9 5.0  PLAN  -more aggressive glucose control  - changed to po prednisone 2/23  -Follow  up chemistry on his follow up visit on 3/4   Positive cardiac enzymes likely due to stress /ACUTE DIASTOLIC CHF. ECHO 2./20 - cor pulmonale and LVEF 60%  No results found for this basename: TROPONINI:5 in the last 168 hours  No results found for this basename: CKTOTAL:3,CKMB:3,TROPONINI:3 in the last 168 hours  Lab 04/28/11 0454 04/25/11 0555 04/24/11 0340  PROBNP 1042.0* 2284.0* 3635.0*    PLAN  -cont bisoprolol  -f/u BNP     Discharge Exam: Temp:  [97.9 F (36.6 C)-98.8 F (37.1 C)] 98.8 F (37.1 C) (02/27 0613) Pulse Rate:  [70-79] 77  (02/27 0613) Resp:  [20] 20  (02/27 0613) BP: (107-130)/(60-71) 130/71 mmHg (02/27 0613) SpO2:  [92 %-95 %] 95 % (02/27 3790)   Physical Examination:  General: MO AAM nad sitting up  Neuro: intact  HEENT: Short neck, no jvd  Cardiovascular: hsd rrr  Lungs: Decreased exp wheeze,  Abdomen: Obese, + bs  Musculoskeletal: Intact  Skin: Warm +lower ext edema   Labs at discharge Lab Results  Component Value Date   CREATININE 2.12* 04/28/2011   BUN 73* 04/28/2011   NA 138 04/28/2011   K 5.2* 04/28/2011   CL 99 04/28/2011   CO2 33* 04/28/2011   Lab Results  Component Value Date   WBC 14.7* 04/28/2011   HGB 17.1* 04/28/2011   HCT 54.8* 04/28/2011   MCV 94.5 04/28/2011   PLT 51* 04/28/2011   Lab Results  Component Value Date   ALT 28 04/28/2011   AST 14 04/28/2011   ALKPHOS 112 04/28/2011   BILITOT 0.5 04/28/2011   Lab Results  Component Value Date   INR 1.06 04/22/2011   INR 1.10 04/16/2011   INR 1.12 04/16/2011    Current radiology studies Dg Chest Port 1 View  04/27/2011  *RADIOLOGY REPORT*  Clinical Data: Low oxygen saturation, COPD  PORTABLE CHEST - 1 VIEW  Comparison: April 22, 2011  Findings: There has been slight interval improvement in the right lower lobe air space opacity.  The small right effusion in the minor fissure has resolved.  The left lung is clear.  The cardiac silhouette, mediastinum, pulmonary vasculature are within normal limits.  IMPRESSION: There has been some interval improvement in the right lower lobe infiltrate and small effusion.  Original Report Authenticated By: Brandon Melnick, M.D.    Disposition:  01-Home or Self Care  Discharge Orders    Future Appointments: Provider: Department: Dept Phone: Center:   05/03/2011 10:15 AM Rubye Oaks, NP [facility] [phone] None   07/08/2011 10:15  AM Carollee Herter, MD [facility] [phone] [facility]     Future Orders Please Complete By Expires   For home use only DME oxygen      Diet - low sodium heart healthy      Increase activity slowly      Call MD for:  temperature >100.4      Call MD for:  difficulty breathing, headache or visual disturbances      Call MD for:  persistant dizziness or light-headedness      Call MD for:  extreme fatigue      (HEART FAILURE PATIENTS) Call MD:  Anytime you have any of the following symptoms: 1) 3 pound weight gain in 24 hours or 5 pounds in 1 week 2) shortness of breath, with or without a dry hacking cough 3) swelling in the hands, feet or stomach 4) if you have to sleep on extra pillows at night  in order to breathe.      Discharge instructions      Comments:   Use oxygen at 5 liters all times Use your New BIPAP device nightly as directed.     Medication List  As of 04/28/2011 11:32 AM   STOP taking these medications         allopurinol 100 MG tablet      aspirin 81 MG tablet      colchicine 0.6 MG tablet      Fluticasone-Salmeterol 250-50 MCG/DOSE Aepb      glimepiride 2 MG tablet      lisinopril 20 MG tablet      magnesium 30 MG tablet         TAKE these medications         bisoprolol 5 MG tablet   Commonly known as: ZEBETA   Take 1 tablet (5 mg total) by mouth 2 (two) times daily.      fluticasone 50 MCG/ACT nasal spray   Commonly known as: FLONASE   Place 2 sprays into the nose daily.      furosemide 40 MG tablet   Commonly known as: LASIX   Take 2 tablets in the morning and two in the evening      insulin glargine 100 UNIT/ML injection   Commonly known as: LANTUS   Inject 15 Units into the skin 2 (two) times daily.      insulin glulisine 100 UNIT/ML injection   Commonly known as: APIDRA   Inject 20 Units into the skin 3 (three) times daily before meals.      ipratropium 0.02 % nebulizer solution   Commonly known as: ATROVENT   Take 2.5 mLs (0.5  mg total) by nebulization 4 (four) times daily.      levalbuterol 0.63 MG/3ML nebulizer solution   Commonly known as: XOPENEX   Take 3 mLs (0.63 mg total) by nebulization every 6 (six) hours.      levofloxacin 750 MG tablet   Commonly known as: LEVAQUIN   Take 1 tablet (750 mg total) by mouth daily.      pantoprazole 40 MG tablet   Commonly known as: PROTONIX   Take 1 tablet (40 mg total) by mouth daily.      paricalcitol 2 MCG capsule   Commonly known as: ZEMPLAR   Take 1 capsule (2 mcg total) by mouth every other day.      predniSONE 10 MG tablet   Commonly known as: DELTASONE   Take 3 tabs daily for 3 days, then 2 tabs daily for three days, then 1 tab daily for three days.      simvastatin 40 MG tablet   Commonly known as: ZOCOR   Take 1 tablet (40 mg total) by mouth at bedtime. Once daily           Follow-up Information    Follow up with PARRETT,TAMMY, NP on 05/03/2011. (at 1015 am. )    Contact information:   [employer] [employer] [address] [place] [address] [phone]          Discharged Condition: Weak, needs home health on follow up with escalated O2 support.  Signed: BABCOCK,PETE 04/28/2011, 11:32 AM   Pt independently  seen and examined and available cxr's reviewed and I agree with above findings/ imp/ plan   Sandrea Hughs, MD Pulmonary and Critical Care Medicine [facility] Cell [phone]

## 2011-04-27 NOTE — Evaluation (Signed)
Occupational Therapy Evaluation Patient Details Name: [name] [name] MRN: [id number] DOB: 07-08-1940 Today's Date: 04/27/2011  Problem List:  Patient Active Problem List  Diagnoses  . DIABETES MELLITUS, TYPE II  . HYPERLIPIDEMIA  . OBESITY  . MORBID OBESITY  . THROMBOCYTOPENIA  . ERECTILE DYSFUNCTION  . TOBACCO USER  . DEPRESSION  . HYPERTENSION  . ALLERGIC RHINITIS  . COPD  . CHRONIC RESPIRATORY FAILURE  . ESOPHAGEAL STRICTURE  . GASTRITIS  . DUODENITIS, WITHOUT HEMORRHAGE  . GASTROPARESIS  . DIVERTICULOSIS, COLON  . PANCREATITIS, ACUTE  . ARTHRITIS  . SEIZURE DISORDER  . SLEEP APNEA  . ABDOMINAL PAIN, EPIGASTRIC  . ADENOCARCINOMA, PROSTATE, HX OF  . COLONIC POLYPS, ADENOMATOUS, HX OF  . Pancreatitis, acute    Past Medical History:  Past Medical History  Diagnosis Date  . Renal insufficiency   . Diabetes mellitus   . GERD (gastroesophageal reflux disease)   . Gastroparesis   . Esophageal stricture   . Hyperlipemia   . Hypertension   . Pancreatitis   . Chronic respiratory disease   . Depression   . Seizure disorder   . Arthritis   . Diverticulosis   . Sleep apnea     CPAP Machine  . Obesity   . ED (erectile dysfunction)   . Sleep apnea   . COPD (chronic obstructive pulmonary disease)     Gold Stage  III/IV  . H/O polycythemia   . Chronic kidney disease (CKD), stage III (moderate)   . DDD (degenerative disc disease), lumbar   . History of esophageal stricture   . Erectile dysfunction   . Prostate cancer   . Peptic ulcer disease   . History of colon polyps   . Gout   . Gastroparesis    Past Surgical History:  Past Surgical History  Procedure Date  . Prostate surgery   . Coronary artery bypass graft     x 4  . Bone marrow biopsy   . Cardiac catheterization 05/02/2008  . Back surgery     lumbar    OT Assessment/Plan/Recommendation OT Assessment Clinical Impression Statement: Pt is a 71 yo male admitted with respiratory failure. Skilled OT  recommended to maximize I w/BADLs utilizing proper energy conservation strategies. Pt lives independently. Pt will likely not need any f/u OT at home. OT Recommendation/Assessment: Patient will need skilled OT in the acute care venue OT Problem List: Decreased activity tolerance;Decreased knowledge of use of DME or AE;Cardiopulmonary status limiting activity Barriers to Discharge: Decreased caregiver support OT Therapy Diagnosis : Generalized weakness OT Plan OT Frequency: Min 2X/week OT Treatment/Interventions: Self-care/ADL training;Therapeutic activities;Energy conservation;DME and/or AE instruction;Patient/family education OT Recommendation Follow Up Recommendations: No OT follow up Equipment Recommended: None recommended by OT Individuals Consulted Consulted and Agree with Results and Recommendations: Patient OT Goals Acute Rehab OT Goals OT Goal Formulation: With patient Time For Goal Achievement: 2 weeks ADL Goals Pt Will Perform Grooming: with modified independence;Sitting, edge of bed;Unsupported (X 3 tasks.) ADL Goal: Grooming - Progress: Goal set today Pt Will Perform Lower Body Bathing: with modified independence;Sit to stand from bed;Sit to stand from chair;with adaptive equipment ADL Goal: Lower Body Bathing - Progress: Goal set today Pt Will Perform Lower Body Dressing: with modified independence;Sit to stand from bed;Sit to stand from chair;with adaptive equipment ADL Goal: Lower Body Dressing - Progress: Goal set today Pt Will Transfer to Toilet: with modified independence;Ambulation;Regular height toilet ADL Goal: Toilet Transfer - Progress: Goal set today Pt Will Perform Toileting -  Clothing Manipulation: with modified independence;with adaptive equipment;Standing ADL Goal: Toileting - Clothing Manipulation - Progress: Goal set today Pt Will Perform Toileting - Hygiene: with modified independence;Sit to stand from 3-in-1/toilet ADL Goal: Toileting - Hygiene - Progress:  Goal set today Pt Will Perform Tub/Shower Transfer: Tub transfer;with modified independence;Ambulation ADL Goal: Tub/Shower Transfer - Progress: Goal set today Additional ADL Goal #1: Pt will verbalize 3 energy conservation strategies for successful ADL completion. ADL Goal: Additional Goal #1 - Progress: Goal set today  OT Evaluation Precautions/Restrictions  Restrictions Weight Bearing Restrictions: No Prior Functioning Home Living Lives With: Alone Type of Home: House Home Layout: One level Home Access: Stairs to enter Entrance Stairs-Rails: Left Entrance Stairs-Number of Steps: 2 Bathroom Shower/Tub: Engineer, manufacturing systems: Handicapped height Home Adaptive Equipment: Walker - rolling;Other (comment) (home o2 at 3L) Prior Function Level of Independence: Independent with basic ADLs;Independent with transfers;Independent with gait;Independent with homemaking with ambulation Driving: Yes Vocation: Retired ADL ADL Grooming: Simulated;Supervision/safety Where Assessed - Grooming: Standing at sink Upper Body Bathing: Simulated;Set up Where Assessed - Upper Body Bathing: Sitting, bed;Unsupported Lower Body Bathing: Simulated;Minimal assistance Where Assessed - Lower Body Bathing: Sit to stand from bed Upper Body Dressing: Simulated;Set up Where Assessed - Upper Body Dressing: Sitting, bed;Unsupported Lower Body Dressing: Minimal assistance Where Assessed - Lower Body Dressing: Sit to stand from bed Toilet Transfer: Performed;Supervision/safety Toilet Transfer Method: Proofreader: Regular height toilet;Grab bars Toileting - Clothing Manipulation: Simulated;Supervision/safety Where Assessed - Toileting Clothing Manipulation: Sit to stand from 3-in-1 or toilet Toileting - Hygiene: Simulated;Supervision/safety Where Assessed - Toileting Hygiene: Sit to stand from 3-in-1 or toilet Tub/Shower Transfer: Not assessed Tub/Shower Transfer Method: Not  assessed Ambulation Related to ADLs: Pt ambulated to bathroom pushing portable O2 tank. Desaturated to 87% following activity. At rest pt's O2 saturation lingers in the high 80s to low 90s. ADL Comments: Educated pt on energy conservation strategies. Provided handout. Max time and effort needed for all functional tasks. Vision/Perception    Cognition Cognition Arousal/Alertness: Awake/alert Overall Cognitive Status: Appears within functional limits for tasks assessed Orientation Level: Oriented X4 Sensation/Coordination   Extremity Assessment RUE Assessment RUE Assessment: Within Functional Limits LUE Assessment LUE Assessment: Within Functional Limits Mobility  Bed Mobility Bed Mobility: Yes Supine to Sit: 6: Modified independent (Device/Increase time);HOB flat;With rails Transfers Transfers: Yes Sit to Stand: 5: Supervision;From bed;From toilet;With upper extremity assist Exercises   End of Session OT - End of Session Activity Tolerance: Patient limited by fatigue Patient left: in chair;with call bell in reach General Behavior During Session: [facility] for tasks performed Cognition: [place] for tasks performed   Lanasia Porras A, OTR/L [phone] 04/27/2011, 3:45 PM

## 2011-04-27 NOTE — Progress Notes (Signed)
Agree with assessment, no changes needed.  Chilton Si, Rayven Rettig L  8:42 AM

## 2011-04-27 NOTE — Progress Notes (Signed)
LOS: 11 Requesting MD: Dr. Ranae Palms EDP Primary = Gerrit Heck PCCM  Brief patient profile:  62 yobm  Active smoker with CRI on ACEI to [facility] EDP  2/15 with 2 days of increasing DOE/SOB without fevers, chills, sweats or sputum production.  Placed on NIMVS for hypoxic/hypercarbic resp failure.  PCCM asked to admit. Office patient of [name] for COPD    Micro/sepsis: MRSA screen > neg 2/15 bc x 2>>neg 2/15 uc>>ecoli>>ss roc and quinolones. Entereococcus - sensitive to ampi and levoflox 2/16 SPUTUM  >>nl flora Urine Strep ag 2/15 > neg Procalcitonin 2/15   0.36 Urine culture 2/22 >>neg  Antibiotics: 2/15 Avelox(aecodp)>>2/18 2/18 roc(uti/aecopd)>>2/22 2/22 PO levoflox (dual uti) >>   Tests / Events: Trop 2.0 2/15 > added bisoprolol 2.5 mg daily am 2/16 plus asa  2/20 new onset scant hemoptysis. ECHO ef 60% but RV dilated  Overnight Now apprehensive about going home as his sats dropped to 82% on 5 liters and he felt very weak during ambulation.    Vital Signs: BP 116/46  Pulse 76  Temp(Src) 98.7 F (37.1 C) (Oral)  Resp 18  Ht 5\' 9"  (1.753 m)  Wt 114.81 kg (253 lb 1.8 oz)  BMI 37.38 kg/m2  SpO2 95%    Intake/Output Summary (Last 24 hours) at 04/27/11 1318 Last data filed at 04/27/11 0954  Gross per 24 hour  Intake    840 ml  Output   1650 ml  Net   -810 ml     Physical Examination: General:  MO AAM  nad sitting up Neuro:  intact   HEENT:  Short neck, no jvd Cardiovascular:  hsd rrr Lungs:  Decreased exp wheeze, Abdomen:  Obese, + bs Musculoskeletal:  Intact Skin:  Warm +lower ext edema     Labs and Imaging:  No results found.    Lab 04/25/11 0555 04/24/11 0340 04/23/11 0305 04/22/11 0305 04/21/11 0305  PROBNP 2284.0* 3635.0* 8044.0* 8503.0* 8197.0*       Lab 04/26/11 0440 04/25/11 0555 04/24/11 0340  NA 137 137 138  K 4.9 5.0 4.8  CL 96 95* 99  CO2 34* 37* 33*  BUN 72* 67* 66*  CREATININE 2.22* 2.34* 1.87*  GLUCOSE 253* 174* 129*     Lab 04/26/11 0440 04/24/11 0340 04/23/11 0305  HGB 17.8* 18.8* 18.9*  HCT 55.0* 58.2* 57.6*  WBC 14.4* 15.5* 13.7*  PLT 65* 68* 75*   ABG    Component Value Date/Time   PHART 7.349* 04/22/2011 0514   PCO2ART 59.6* 04/22/2011 0514   PO2ART 55.5* 04/22/2011 0514   HCO3 32.0* 04/22/2011 0514   TCO2 26.6 04/22/2011 0514   O2SAT 88.4 04/22/2011 0514    Assessment and Plan: Acute on Chronic resp failure in the setting of ongoing tobacco abuse, aecopd w/ purulent bronchitis, osa and  Volume overload on ACEI  PLAN - xopenex 2/16 due to pos trop.   - steroids to continue but changed to po on 2/23   -cotninue nocturnal auto bipap - f/u cxr -diurese as possible -may need increased FIO2 at home   CRI base creatine 2.05 s/p D/C ACEI 2/15  Lab Results  Component Value Date   CREATININE 2.22* 04/26/2011   CREATININE 2.34* 04/25/2011   CREATININE 1.87* 04/24/2011  Plan: -f/u chemistry, diuretics adjusted.     Hemoptysis: 2/20, still present as of 04/27/11 - ? Due to diastolic chf and rising bnp and RLL PNA on CT 2/22. Duplex Leg negative 2/21 PLAN -hold asa -pas -  cont abx   Hematuria (resolved)  DM CBG (last 3)   Basename 04/27/11 1159 04/27/11 0744 04/26/11 2118  GLUCAP 242* 241* 265*  plan: -ssi -decrease steroids  SZ DO -cont home meds  Thrombocytopenia ? Cause/ chronic Lab Results  Component Value Date   PLT 65* 04/26/2011   PLT 68* 04/24/2011   PLT 75* 04/23/2011   PLT 57* 06/09/2009   PLT 53* 04/14/2009   PLT 73* 01/01/2009   should be able to tol low dose asa on ppi bid SCDs   HIT panel 2/24 >>>  Hyperkalemia  Lab 04/26/11 0440 04/25/11 0555 04/24/11 0340 04/23/11 0305 04/22/11 0305  K 4.9 5.0 4.8 5.0 5.3*  PLAN  -more aggressive glucose control  - 2/20 kaexlate and started diuresis - changed to po prednisone 2/23  Positive cardiac enzymes likely due to stress /ACUTE DIASTOLIC CHF. ECHO 2./20 - cor pulmonale and LVEF 60%   No results found for this  basename: TROPONINI:5 in the last 168 hours  Lab 04/25/11 0555 04/24/11 0340 04/23/11 0305 04/22/11 0305 04/21/11 0305  PROBNP 2284.0* 3635.0* 8044.0* 8503.0* 8197.0*  PLAN Rx asa/ bisoprolol if bp tolerates> titrated to 5 mg bid 2/18 -change resp rx to xopenex - DIURESISE AGGRESSIVELY starting 2/20   Intake/Output Summary (Last 24 hours) at 04/27/11 1318 Last data filed at 04/27/11 0954  Gross per 24 hour  Intake    840 ml  Output   1650 ml  Net   -810 ml    Best practices / Disposition: --> moved to tele floor status under PCCM on 2/23  -->full code -->PAS DVT Px (low plt) -->Protonix for GI Px --> ? Home 2/27 after more aggressive diuresis.

## 2011-04-27 NOTE — Progress Notes (Signed)
ANTIBIOTIC CONSULT NOTE - FOLLOW UP  Pharmacy Consult for Levaquin Indication: UTI/PNA No Known Allergies  Patient Measurements: Height: 5\' 9"  (175.3 cm) Weight: 253 lb 1.9 oz (114.814 kg) IBW/kg (Calculated) : 70.7   Vital Signs: Temp: 98.7 F (37.1 C) (02/26 0523) Temp src: Oral (02/26 0523) BP: 116/46 mmHg (02/26 0523) Pulse Rate: 76  (02/26 0523) Intake/Output from previous day: 02/25 0701 - 02/26 0700 In: 1080 [P.O.:1080] Out: 1550 [Urine:1550]  Labs:  Basename 04/26/11 0440 04/25/11 0555  WBC 14.4* --  HGB 17.8* --  PLT 65* --  LABCREA -- --  CREATININE 2.22* 2.34*   Estimated Creatinine Clearance: 37 ml/min (by C-G formula based on Cr of 2.22).    Microbiology: Recent Results (from the past 720 hour(s))  CULTURE, BLOOD (ROUTINE X 2)     Status: Normal   Collection Time   04/16/11  1:20 PM      Component Value Range Status Comment   Specimen Description BLOOD RIGHT HAND   Final    Special Requests BOTTLES DRAWN AEROBIC AND ANAEROBIC    Final    Culture  Setup Time [id number]   Final    Culture NO GROWTH 5 DAYS   Final    Report Status 04/22/2011 FINAL   Final   CULTURE, BLOOD (ROUTINE X 2)     Status: Normal   Collection Time   04/16/11  1:29 PM      Component Value Range Status Comment   Specimen Description BLOOD RIGHT ARM   Final    Special Requests BOTTLES DRAWN AEROBIC AND ANAEROBIC    Final    Culture  Setup Time [id number]   Final    Culture NO GROWTH 5 DAYS   Final    Report Status 04/22/2011 FINAL   Final   URINE CULTURE     Status: Normal   Collection Time   04/16/11  2:33 PM      Component Value Range Status Comment   Specimen Description URINE, CATHETERIZED   Final    Special Requests NONE   Final    Culture  Setup Time [id number]   Final    Colony Count 95,000 COLONIES/ML   Final    Culture     Final    Value: ESCHERICHIA COLI     ENTEROCOCCUS SPECIES   Report Status 04/20/2011 FINAL   Final    Organism ID, Bacteria  ESCHERICHIA COLI   Final    Organism ID, Bacteria ENTEROCOCCUS SPECIES   Final   MRSA PCR SCREENING     Status: Normal   Collection Time   04/16/11 10:06 PM      Component Value Range Status Comment   MRSA by PCR NEGATIVE  NEGATIVE  Final   CULTURE, SPUTUM-ASSESSMENT     Status: Normal   Collection Time   04/17/11  7:28 AM      Component Value Range Status Comment   Specimen Description SPUTUM   Final    Special Requests Normal   Final    Sputum evaluation     Final    Value: THIS SPECIMEN IS ACCEPTABLE. RESPIRATORY CULTURE REPORT TO FOLLOW.   Report Status 04/17/2011 FINAL   Final   CULTURE, RESPIRATORY     Status: Normal   Collection Time   04/17/11  7:28 AM      Component Value Range Status Comment   Specimen Description SPUTUM   Final    Special Requests NONE   Final  Gram Stain     Final    Value: NO WBC SEEN     NO SQUAMOUS EPITHELIAL CELLS SEEN     RARE GRAM POSITIVE COCCI IN PAIRS   Culture NORMAL OROPHARYNGEAL FLORA   Final    Report Status 04/19/2011 FINAL   Final   URINE CULTURE     Status: Normal   Collection Time   04/23/11  2:03 PM      Component Value Range Status Comment   Specimen Description URINE, CATHETERIZED   Final    Special Requests Normal   Final    Culture  Setup Time [id number]   Final    Colony Count NO GROWTH   Final    Culture NO GROWTH   Final    Report Status 04/25/2011 FINAL   Final     Anti-infectives     Start     Dose/Rate Route Frequency Ordered Stop   04/25/11 1400   Levofloxacin (LEVAQUIN) IVPB 750 mg        750 mg 100 mL/hr over 90 Minutes Intravenous Every 48 hours 04/24/11 1317     04/23/11 1500   levofloxacin (LEVAQUIN) tablet 750 mg  Status:  Discontinued        750 mg Oral Every 48 hours 04/23/11 1333 04/24/11 1228   04/19/11 1400   cefTRIAXone (ROCEPHIN) 1 g in dextrose 5 % 50 mL IVPB  Status:  Discontinued        1 g 100 mL/hr over 30 Minutes Intravenous Every 24 hours 04/19/11 1306 04/23/11 1331   04/18/11 1800    moxifloxacin (AVELOX) tablet 400 mg  Status:  Discontinued        400 mg Oral Daily-1800 04/18/11 0915 04/19/11 1306   04/16/11 1400   moxifloxacin (AVELOX) IVPB 400 mg  Status:  Discontinued        400 mg 250 mL/hr over 60 Minutes Intravenous Every 24 hours 04/16/11 1345 04/18/11 0915          Assessment:  73 YOF adm 2/15 w/worsenting respiratory sx (acute on chronic resp failure)  Day # 5 Levaquin IV 750mg  q48h for UTI/PNA  Urine cx 2/15 with Ecoli and Enterococcus, both sens to levaquin  Previous ABX: Avelox 2/15-18, Rocephin 2/18-2/22  SCr increased, but trending down as of last. SCr: 2.22 (2/25), CrCl: 22mL/min  Goal of Therapy:  Appropriate renal dose of Levaquin  Plan:   Levaquin 750mg  IV q48h  Follow Scr and adjust as necessary  F/U repeat Urine cx.  Questionable discharge for 2/26 per MD note   Marijo Conception, Pharmacy Student 04/27/2011 8:08 AM

## 2011-04-27 NOTE — Progress Notes (Signed)
Inpatient Diabetes Program Recommendations  AACE/ADA: New Consensus Statement on Inpatient Glycemic Control (2009)  Target Ranges:  Prepandial:   less than 140 mg/dL      Peak postprandial:   less than 180 mg/dL (1-2 hours)      Critically ill patients:  140 - 180 mg/dL   Reason for Visit: Hyperglycemia  Results for [name] [name], [name] [name] (MRN [id number]) as of 04/27/2011 10:43  Ref. Range 04/26/2011 07:44 04/26/2011 12:08 04/26/2011 17:36 04/26/2011 21:18 04/27/2011 07:44  Glucose-Capillary Latest Range: 70-99 mg/dL 213 (H) 086 (H) 578 (H) 265 (H) 241 (H)    Inpatient Diabetes Program Recommendations Insulin - Basal: * Insulin - Meal Coverage: Add Novolog 4 units tidwc for meal coverage insulin HgbA1C: *  Note: Will follow.

## 2011-04-27 NOTE — Progress Notes (Signed)
Ambulated with patient down hallway. Initially, O2 was set on 3L. At rest, patient's sats were at 88%. Then, while walking, the sats dropped to 83% and ranged between 82%-85% while walking. During rests, the sats would improve slightly, getting no better than 87%. The O2 was titrated up to 5L, and sats improved more. During ambulation, the patient's sats ranged between 86%-89% and was 90%-94% during breaks.

## 2011-04-28 DIAGNOSIS — J441 Chronic obstructive pulmonary disease with (acute) exacerbation: Secondary | ICD-10-CM

## 2011-04-28 DIAGNOSIS — I5031 Acute diastolic (congestive) heart failure: Secondary | ICD-10-CM

## 2011-04-28 DIAGNOSIS — N179 Acute kidney failure, unspecified: Secondary | ICD-10-CM

## 2011-04-28 DIAGNOSIS — E662 Morbid (severe) obesity with alveolar hypoventilation: Secondary | ICD-10-CM

## 2011-04-28 DIAGNOSIS — I2781 Cor pulmonale (chronic): Secondary | ICD-10-CM

## 2011-04-28 DIAGNOSIS — J411 Mucopurulent chronic bronchitis: Secondary | ICD-10-CM | POA: Diagnosis present

## 2011-04-28 DIAGNOSIS — J9600 Acute respiratory failure, unspecified whether with hypoxia or hypercapnia: Secondary | ICD-10-CM

## 2011-04-28 DIAGNOSIS — J811 Chronic pulmonary edema: Secondary | ICD-10-CM | POA: Diagnosis present

## 2011-04-28 DIAGNOSIS — J9620 Acute and chronic respiratory failure, unspecified whether with hypoxia or hypercapnia: Secondary | ICD-10-CM | POA: Diagnosis present

## 2011-04-28 HISTORY — DX: Morbid (severe) obesity with alveolar hypoventilation: E66.2

## 2011-04-28 LAB — CBC
HCT: 54.8 % — ABNORMAL HIGH (ref 39.0–52.0)
MCH: 29.5 pg (ref 26.0–34.0)
MCHC: 31.2 g/dL (ref 30.0–36.0)
MCV: 94.5 fL (ref 78.0–100.0)
RDW: 14.4 % (ref 11.5–15.5)

## 2011-04-28 LAB — GLUCOSE, CAPILLARY: Glucose-Capillary: 431 mg/dL — ABNORMAL HIGH (ref 70–99)

## 2011-04-28 LAB — COMPREHENSIVE METABOLIC PANEL
Albumin: 3 g/dL — ABNORMAL LOW (ref 3.5–5.2)
BUN: 73 mg/dL — ABNORMAL HIGH (ref 6–23)
Chloride: 99 mEq/L (ref 96–112)
Creatinine, Ser: 2.12 mg/dL — ABNORMAL HIGH (ref 0.50–1.35)
Total Bilirubin: 0.5 mg/dL (ref 0.3–1.2)
Total Protein: 6.5 g/dL (ref 6.0–8.3)

## 2011-04-28 LAB — HEPARIN INDUCED THROMBOCYTOPENIA PNL
Heparin Induced Plt Ab: POSITIVE
Patient O.D.: 0.439
UFH High Dose UFH H: 0 % Release
UFH Low Dose 0.1 IU/mL: 0 % Release
UFH Low Dose 0.5 IU/mL: 0 % Release
UFH SRA Result: NEGATIVE

## 2011-04-28 LAB — PRO B NATRIURETIC PEPTIDE: Pro B Natriuretic peptide (BNP): 1042 pg/mL — ABNORMAL HIGH (ref 0–125)

## 2011-04-28 MED ORDER — FUROSEMIDE 40 MG PO TABS: ORAL_TABLET | ORAL | Status: DC

## 2011-04-28 MED ORDER — LEVOFLOXACIN 750 MG PO TABS: 750.0000 mg | ORAL_TABLET | Freq: Every day | ORAL | Status: DC

## 2011-04-28 MED ORDER — PREDNISONE 10 MG PO TABS: ORAL_TABLET | ORAL | Status: DC

## 2011-04-28 MED ORDER — BISOPROLOL FUMARATE 5 MG PO TABS: 5.0000 mg | ORAL_TABLET | Freq: Two times a day (BID) | ORAL | Status: DC

## 2011-04-28 MED ORDER — IPRATROPIUM BROMIDE 0.02 % IN SOLN: 0.5000 mg | Freq: Four times a day (QID) | RESPIRATORY_TRACT | Status: DC

## 2011-04-28 MED ORDER — LEVALBUTEROL HCL 0.63 MG/3ML IN NEBU: 0.6300 mg | INHALATION_SOLUTION | Freq: Four times a day (QID) | RESPIRATORY_TRACT | Status: DC

## 2011-04-28 NOTE — Progress Notes (Addendum)
04/28/2011... 5:00 AM... ... Pt refused CPAP overnight; o2 sats have been WNL on 5 L/[place]; pt on continuous pulse ox.... RN Marvia Pickles

## 2011-04-28 NOTE — Evaluation (Signed)
Physical Therapy Evaluation Patient Details Name: [name] [name] MRN: [id number] DOB: [birth year] Today's Date: 04/28/2011  Problem List:  Patient Active Problem List  Diagnoses  . DIABETES MELLITUS, TYPE II  . HYPERLIPIDEMIA  . OBESITY  . Morbid obesity  . THROMBOCYTOPENIA  . ERECTILE DYSFUNCTION  . TOBACCO USER  . DEPRESSION  . HYPERTENSION  . ALLERGIC RHINITIS  . COPD  . Chronic respiratory failure  . ESOPHAGEAL STRICTURE  . GASTRITIS  . DUODENITIS, WITHOUT HEMORRHAGE  . GASTROPARESIS  . DIVERTICULOSIS, COLON  . PANCREATITIS, ACUTE  . ARTHRITIS  . SEIZURE DISORDER  . SLEEP APNEA  . ABDOMINAL PAIN, EPIGASTRIC  . ADENOCARCINOMA, PROSTATE, HX OF  . COLONIC POLYPS, ADENOMATOUS, HX OF  . Pancreatitis, acute  . Acute exacerbation of chronic obstructive pulmonary disease (COPD)  . Purulent bronchitis  . Pulmonary edema  . Cor pulmonale  . Obesity hypoventilation syndrome    Past Medical History:  Past Medical History  Diagnosis Date  . Renal insufficiency   . Diabetes mellitus   . GERD (gastroesophageal reflux disease)   . Gastroparesis   . Esophageal stricture   . Hyperlipemia   . Hypertension   . Pancreatitis   . Chronic respiratory disease   . Depression   . Seizure disorder   . Arthritis   . Diverticulosis   . Sleep apnea     CPAP Machine  . Obesity   . ED (erectile dysfunction)   . Sleep apnea   . COPD (chronic obstructive pulmonary disease)     Gold Stage  III/IV  . H/O polycythemia   . Chronic kidney disease (CKD), stage III (moderate)   . DDD (degenerative disc disease), lumbar   . History of esophageal stricture   . Erectile dysfunction   . Prostate cancer   . Peptic ulcer disease   . History of colon polyps   . Gout   . Gastroparesis    Past Surgical History:  Past Surgical History  Procedure Date  . Prostate surgery   . Coronary artery bypass graft     x 4  . Bone marrow biopsy   . Cardiac catheterization 05/02/2008  . Back surgery      lumbar    PT Assessment/Plan/Recommendation PT Assessment Clinical Impression Statement: Pt admitted for acute on chronic respiratory failure.  Pt currently at baseline and agrees to not require skilled PT services.  Pt knows to rest when needed and educated to perform pursed lip breathing when SOB.  Pt reports he is ready "to get out of here."  Will d/c from acute PT.  Recommend pt ambulate with staff. PT Recommendation/Assessment: Patent does not need any further PT services No Skilled PT: Patient at baseline level of functioning;All education completed PT Recommendation Follow Up Recommendations: No PT follow up Equipment Recommended: None recommended by PT PT Goals     PT Evaluation Precautions/Restrictions    Prior Functioning  Home Living Lives With: Alone Type of Home: House Home Layout: One level Home Access: Stairs to enter Entrance Stairs-Rails: Left Entrance Stairs-Number of Steps: 2 Bathroom Shower/Tub: Engineer, manufacturing systems: Handicapped height Home Adaptive Equipment: Walker - rolling;Other (comment) (home o2 at 3L) Additional Comments: Pt reports starting on 2.5 L home oxygen. Prior Function Level of Independence: Independent with basic ADLs;Independent with transfers;Independent with gait;Independent with homemaking with ambulation Driving: Yes Vocation: Retired Financial risk analyst Arousal/Alertness: Awake/alert Overall Cognitive Status: Appears within functional limits for tasks assessed Sensation/Coordination   Extremity Assessment RLE Assessment RLE Assessment: Within  Functional Limits LLE Assessment LLE Assessment: Within Functional Limits Mobility (including Balance) Bed Mobility Bed Mobility: No (up in chair) Transfers Transfers: Yes Sit to Stand: 5: Supervision;From chair/3-in-1;With upper extremity assist;With armrests Stand to Sit: 5: Supervision;With upper extremity assist;With armrests;To  chair/3-in-1 Ambulation/Gait Ambulation/Gait: Yes Ambulation/Gait Assistance: 5: Supervision Ambulation/Gait Assistance Details (indicate cue type and reason): 100 x2 one short standing rest break for breathing and checking SaO2 92% on 4L however upon return to chair 89% on 4L so placed back on 5L and performed pursed lip breathing. Ambulation Distance (Feet): 200 Feet Assistive device: None Gait Pattern: Step-through pattern Gait velocity: Pt reports he knows when to rest.    Exercise    End of Session PT - End of Session Activity Tolerance: Patient tolerated treatment well Patient left: in chair;with call bell in reach General Behavior During Session: [facility] for tasks performed Cognition: [facility] for tasks performed  Jud Fanguy,KATHrine E 04/28/2011, 12:21 PM Pager: [phone]

## 2011-04-28 NOTE — Progress Notes (Signed)
LOS: 12 Requesting MD: Dr. Ranae Palms EDP Primary = Gerrit Heck PCCM Discharge day  Brief patient profile:  40 yobm  Active smoker with CRI on ACEI to [facility] EDP  2/15 with 2 days of increasing DOE/SOB without fevers, chills, sweats or sputum production.  Placed on NIMVS for hypoxic/hypercarbic resp failure.  PCCM asked to admit. Office patient of [name] for COPD    Micro/sepsis: MRSA screen > neg 2/15 bc x 2>>neg 2/15 uc>>ecoli>>ss roc and quinolones. Entereococcus - sensitive to ampi and levoflox 2/16 SPUTUM  >>nl flora Urine Strep ag 2/15 > neg Procalcitonin 2/15   0.36 Urine culture 2/22 >>neg  Antibiotics: 2/15 Avelox(aecodp)>>2/18 2/18 roc(uti/aecopd)>>2/22 2/22 PO levoflox (dual uti) >>   Tests / Events: Trop 2.0 2/15 > added bisoprolol 2.5 mg daily am 2/16 plus asa  2/20 new onset scant hemoptysis. ECHO ef 60% but RV dilated  Overnight Wants to go home,   Vital Signs: BP 130/71  Pulse 77  Temp(Src) 98.8 F (37.1 C) (Oral)  Resp 20  Ht 5\' 9"  (1.753 m)  Wt 114.81 kg (253 lb 1.8 oz)  BMI 37.38 kg/m2  SpO2 95%    Intake/Output Summary (Last 24 hours) at 04/28/11 1028 Last data filed at 04/28/11 0400  Gross per 24 hour  Intake    240 ml  Output   1000 ml  Net   -760 ml     Physical Examination: General:  MO AAM  nad sitting up Neuro:  intact   HEENT:  Short neck, no jvd Cardiovascular:  hsd rrr Lungs:  Decreased exp wheeze, Abdomen:  Obese, + bs Musculoskeletal:  Intact Skin:  Warm +lower ext edema   Labs and Imaging:  Dg Chest Port 1 View  04/27/2011  *RADIOLOGY REPORT*  Clinical Data: Low oxygen saturation, COPD  PORTABLE CHEST - 1 VIEW  Comparison: April 22, 2011  Findings: There has been slight interval improvement in the right lower lobe air space opacity.  The small right effusion in the minor fissure has resolved.  The left lung is clear.  The cardiac silhouette, mediastinum, pulmonary vasculature are within normal limits.  IMPRESSION:  There has been some interval improvement in the right lower lobe infiltrate and small effusion.  Original Report Authenticated By: Brandon Melnick, M.D.      Lab 04/28/11 0454 04/25/11 0555 04/24/11 0340 04/23/11 0305 04/22/11 0305  PROBNP 1042.0* 2284.0* 3635.0* 8044.0* 8503.0*    Lab 04/28/11 0454 04/26/11 0440 04/25/11 0555  NA 138 137 137  K 5.2* 4.9 5.0  CL 99 96 95*  CO2 33* 34* 37*  BUN 73* 72* 67*  CREATININE 2.12* 2.22* 2.34*  GLUCOSE 268* 253* 174*    Lab 04/28/11 0454 04/26/11 0440 04/24/11 0340  HGB 17.1* 17.8* 18.8*  HCT 54.8* 55.0* 58.2*  WBC 14.7* 14.4* 15.5*  PLT 51* 65* 68*   ABG    Component Value Date/Time   PHART 7.349* 04/22/2011 0514   PCO2ART 59.6* 04/22/2011 0514   PO2ART 55.5* 04/22/2011 0514   HCO3 32.0* 04/22/2011 0514   TCO2 26.6 04/22/2011 0514   O2SAT 88.4 04/22/2011 0514    Assessment and Plan: Acute on Chronic resp failure in the setting of ongoing tobacco abuse, aecopd w/ purulent bronchitis, osa and  Volume overload on ACEI: feels better PLAN - xopenex 2/16 due to pos trop.   - taper steroids -cotninue nocturnal auto bipap -diurese as much as possible: will send him home on 80mg  bid -5 liters O2  CRI base creatine 2.05: slowly improving  s/p D/C ACEI 2/15  Lab Results  Component Value Date   CREATININE 2.12* 04/28/2011   CREATININE 2.22* 04/26/2011   CREATININE 2.34* 04/25/2011  Plan: -f/u chemistry, diuretics adjusted.     Hemoptysis: 2/20, still present as of 04/27/11 - ? Due to diastolic chf and rising bnp and RLL PNA on CT 2/22. Duplex Leg negative 2/21 PLAN -hold asa -cont abx   Hematuria (resolved)  DM CBG (last 3)   Basename 04/28/11 0717 04/27/11 2107 04/27/11 1710  GLUCAP 257* 360* 371*  plan: -home on home regimen -decrease steroids  SZ DO -cont home meds  Thrombocytopenia ? Cause/ chronic Lab Results  Component Value Date   PLT 51* 04/28/2011   PLT 65* 04/26/2011   PLT 68* 04/24/2011   PLT 57* 06/09/2009     PLT 53* 04/14/2009   PLT 73* 01/01/2009  plan -HIT panel 2/24 >  POSITIVE -Follow up CBC on 3/4  Hyperkalemia   Lab 04/28/11 0454 04/26/11 0440 04/25/11 0555 04/24/11 0340 04/23/11 0305  K 5.2* 4.9 5.0 4.8 5.0  PLAN  -more aggressive glucose control - changed to po prednisone 2/23 -Follow up chemistry on his follow up visit on 3/4  Positive cardiac enzymes likely due to stress /ACUTE DIASTOLIC CHF. ECHO 2./20 - cor pulmonale and LVEF 60%   No results found for this basename: TROPONINI:5 in the last 168 hours  Lab 04/28/11 0454 04/25/11 0555 04/24/11 0340 04/23/11 0305 04/22/11 0305  PROBNP 1042.0* 2284.0* 3635.0* 8044.0* 8503.0*  PLAN -cont bisoprolol -f/u BNP   Best practices / Disposition: Home today. Have set up close follow up on 3/4 Have requested BNP,BMP and CBC to be ordered at that time.         Sandrea Hughs, MD Pulmonary and Critical Care Medicine [facility] Cell [phone]

## 2011-04-28 NOTE — Progress Notes (Addendum)
DC instructions given to patient.  Patient denies further questions or concerns at this time.  No changes noted since am assessment. IV DC.  Patient DC to home.  Patient drove himself and is requesting to drive himself home. Patient advised of the valuables policy and has all of is belongings in his possession. Home care set up to deliver O2 and BiPap.  Patient reported some concern about being able to afford his medications.  CM notified, but no assistance can be provided due to him having insurance.  Patient told to call insurance company when he leaves and to check with his MD for samples.

## 2011-04-29 ENCOUNTER — Telehealth: Payer: Self-pay | Admitting: Acute Care

## 2011-04-29 NOTE — Telephone Encounter (Signed)
Ok for now but needs to be set up with sleep doc for longterm eval and rx - see if he's seen any of our docs for this and if not get him in with 1st avail

## 2011-04-29 NOTE — Telephone Encounter (Signed)
Anders Simmonds sent order for separate auto settings on BiPap and per Chanetta Marshall at [facility] they cannot do this separately. They can do auto 5-20 on BiPap. Dr. Sherene Sires, is this okay? Pls advise.

## 2011-04-29 NOTE — Telephone Encounter (Signed)
Spoke with jimmy at [facility] and given ok for bipap setting of 5-20.  Spoke with pt and given sleep consult appt with Dr Shelle Iron on 05-17-11 at 11:45.

## 2011-04-30 ENCOUNTER — Telehealth: Payer: Self-pay | Admitting: Pulmonary Disease

## 2011-04-30 MED ORDER — IPRATROPIUM BROMIDE 0.02 % IN SOLN: 0.5000 mg | Freq: Four times a day (QID) | RESPIRATORY_TRACT | Status: DC

## 2011-04-30 NOTE — Telephone Encounter (Signed)
MW is pt's pulmonologist, not Dr. Shelle Iron. Dr. Shelle Iron will be seeing th pt for sleep only. Pt states that [facility] told him that they no longet have Atrovent for the nebulizer and to ask his physician to change this to something different. He was able to fill the Levalbuterol 0.63mg  and is using this.   I called [facility] on [address] and spoke with [facility]. She ran a test claim for the Atrovent and it went through with no problem. She wasn't sure why someone told him they don't carry it because they do have it. Pt is aware and will pick this up later today. Also clarified with the pt that his RX for Zebeta is the Bisoprolol that he already has. Pt verbalized understanding.

## 2011-05-03 ENCOUNTER — Inpatient Hospital Stay: Payer: Medicare Other | Admitting: Adult Health

## 2011-05-04 ENCOUNTER — Ambulatory Visit (INDEPENDENT_AMBULATORY_CARE_PROVIDER_SITE_OTHER)
Admission: RE | Admit: 2011-05-04 | Discharge: 2011-05-04 | Disposition: A | Discharge status: Home / Self Care | Payer: Medicare Other | Source: Ambulatory Visit | Attending: Adult Health | Admitting: Adult Health

## 2011-05-04 ENCOUNTER — Encounter: Payer: Self-pay | Admitting: Adult Health

## 2011-05-04 ENCOUNTER — Ambulatory Visit (INDEPENDENT_AMBULATORY_CARE_PROVIDER_SITE_OTHER): Payer: Medicare Other | Admitting: Adult Health

## 2011-05-04 ENCOUNTER — Other Ambulatory Visit (INDEPENDENT_AMBULATORY_CARE_PROVIDER_SITE_OTHER): Payer: Medicare Other

## 2011-05-04 VITALS — BP 114/74 | HR 83 | Temp 97.4°F | Ht 68.0 in | Wt 257.6 lb

## 2011-05-04 DIAGNOSIS — J189 Pneumonia, unspecified organism: Secondary | ICD-10-CM

## 2011-05-04 DIAGNOSIS — J449 Chronic obstructive pulmonary disease, unspecified: Secondary | ICD-10-CM

## 2011-05-04 DIAGNOSIS — F17200 Nicotine dependence, unspecified, uncomplicated: Secondary | ICD-10-CM

## 2011-05-04 DIAGNOSIS — J9610 Chronic respiratory failure, unspecified whether with hypoxia or hypercapnia: Secondary | ICD-10-CM

## 2011-05-04 DIAGNOSIS — G4730 Sleep apnea, unspecified: Secondary | ICD-10-CM

## 2011-05-04 DIAGNOSIS — R0602 Shortness of breath: Secondary | ICD-10-CM

## 2011-05-04 LAB — BASIC METABOLIC PANEL
BUN: 62 mg/dL — ABNORMAL HIGH (ref 6–23)
Chloride: 89 mEq/L — ABNORMAL LOW (ref 96–112)
GFR: 34.63 mL/min — ABNORMAL LOW (ref >60.00)
Glucose, Bld: 183 mg/dL — ABNORMAL HIGH (ref 70–99)
Potassium: 5 mEq/L (ref 3.5–5.1)
Sodium: 145 mEq/L (ref 135–145)

## 2011-05-04 NOTE — Assessment & Plan Note (Signed)
Patient continues to smoke  Has decreased usage from 5 ppd to 4-6 cigarettes since hospital discharge  Smoking cessation discussed  Educated on the dangers of smoking with oxygen   Plan:  Smoking cessation  No smoking with oxygen!!!  Follow up with Dr. Sherene Sires in 3 months and as needed

## 2011-05-04 NOTE — Assessment & Plan Note (Signed)
Doing well on BIPAP QHS   Plan:  Continue BIPAP  Follow up with Dr. Shelle Iron in 1 week as planned for sleep consult

## 2011-05-04 NOTE — Assessment & Plan Note (Signed)
Recent flare , now resolving  Smoking cesstation encouraged Taper off prednisone as directed.

## 2011-05-04 NOTE — Assessment & Plan Note (Addendum)
Recent flare with Acute on chronic hypercarbic/hypoxic RF w/ hospitalization -complicated by decompensated cor pulmonale and PNA/COPD flare  Clinically improving CXR done today shows residual parenchymal opacity at the right lung base -- improvement from previous CXR on 2/26/20113  Cont on 5 liters O2 at rest and 6 liters O2 with exertion (required 6 lm walking to keep sats > 90 %  Post hospital follow up CBC, BMET & BNP to check kidney function, WBC, and chemistry.    Plan:  Oxygen at 5  liters at rest and 6 liters with exertion  Finish Prednisone taper as directed Low salt diet.  Continue Xopenex and Atrovent Neb Four times a day   Legs elevated.  Smoking cessation No smoking with oxygen!  Labs today: CBC, BMET, BNP Follow up Dr. Sherene Sires  In 3 weeks as planned  follow up Dr. Shelle Iron as planned for sleep consult.

## 2011-05-04 NOTE — Progress Notes (Signed)
Subjective:    Patient ID: [name] [name], male    DOB: April 03, 1940, 71 y.o.   MRN: [id number]  HPI 71 yobm with morbid obesity actively smoking FEV1 of 45% (2009)  - PFTs 10/13/07 FEV1 of 45%, ratio 63%, FVC 15% improvement after bronchodilators DLCO 45%    October 13, 2007 has cut down on smoking now able to walk half a block on 02 2lpm   May 13, 2010 ov Follow up visit-COPD-states wheezing with sleep and weakness. Recent hospital 2/8-10 for pancreatitis some better since discharge. no discolored mucus.      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 Tx with IV abx, steroids, diuresis, BIPAP. Discharged on Levaquin and steroid taper.  Reports breathing has improved since discharge but still has some shortness of breath with exertion along with weakness and fatigue. Minimum cough. Clear mucus production with occasional dark red tinged sputum. BLE edema, greater on right than left. No leg pain. No syncope, dizziness, headaches, vision changes, cp, palpitations or fevers. Uses BiPAP at night and states that he's sleeping much better now than he has in years. Has completed discharge antibiotics. Uses nebulizer's three-  four times daily and is completing current steroid taper.   On 5L continuous O2 and reports smoking 4-6 cigarettes since discharge. Hx  5 ppd smoking at his max.   Admits to smoking inside home and with removal of oxygen in a separate room. Will occasionally leave oxygen system on.  Education provided on smoking cessation. States that he has tried "everything" and that Chantix worked well in the past but can no longer use due to insurance issues.  Extensive education given to not smoke in home with oxygen on. Informed patient that oxygen will be discontinued for his safety if he continues  to smoke with o2 in home.  CXR today showed improved aeration with some residual opacity in RLL   Review of Systems Constitutional:   No  weight loss, night sweats,  Fevers or chills.  + fatigue and lassitude.  HEENT:   No headaches,  difficulty swallowing,  tooth/dental problems, or  sore throat. No sneezing, itching, ear ache, nasal congestion, post nasal drip,   CV:  No chest pain,  orthopnea, PND, anasarca, dizziness, palpitations, syncope.  + swelling in lower extremities  GI:  No heartburn, indigestion, abdominal pain, nausea, vomiting, diarrhea, change in bowel habits, loss of appetite or bloody stools.   Resp: No shortness of breath at rest.  No excess mucus. No non-productive cough,. No wheezing.  No chest wall deformity + shortness of breath with exertion + productive cough with occasional dark red sputum + productive cough with minimal clear sputum   Skin: no rash or lesions.  GU: no dysuria, change in color of urine, no urgency or frequency.  No flank pain, no hematuria   MS:  No joint pain or swelling.  No decreased range of motion.  No back pain.  Psych:  No change in mood or affect. No depression or anxiety.  No memory loss.     Objective:   Physical Exam GEN: well-nourished, obese, pleasant and cooperative 71 yo AAM in NAD  HEENT:  Oral pharyngeal pharynx clear with no lesions, no postnasal drip or exudate noted.   NECK:  Supple w/ fair ROM. No JVD. Normal carotid impulses w/o bruits; no thyromegaly or nodules  palpated; no lymphadenopathy.  RESP: breath sounds present in all fields. Slightly diminished breath sounds to right lower lobe. No wheezes/ rales/ or rhonchi. No accessory muscle use.  CARD:  RRR. No murmurs, rubs or gallops. 2+ edema to BLE, greater on right than left. Pulses intact. No cyanosis or clubbing.  GI:   Obese. soft & nontender. Bowel sounds present in all quadrants. No organomegaly or masses detected.  Musco: Warm bil, no deformities or  joint swelling noted.   Neuro: alert and oriented x 4. no focal deficits noted.    Skin: Warm and dry. No lesions or rashes     Assessment & Plan:

## 2011-05-04 NOTE — Patient Instructions (Signed)
Finish Prednisone taper as directed Low salt diet.  Continue Xopenex and Atrovent Neb Four times a day   Very important to stop smoking.  No smoking in house - with oxygen -it is very dangerous-flammable.  Legs elevated.  Follow up Dr. Sherene Sires  In 3 weeks Dr. Sherene Sires   follow up Dr. Shelle Iron as planned for sleep consult.

## 2011-05-05 LAB — CBC WITH DIFFERENTIAL/PLATELET
Basophils Relative: 0.4 % (ref 0.0–3.0)
Eosinophils Absolute: 0 10*3/uL (ref 0.0–0.7)
HCT: 58.1 % — ABNORMAL HIGH (ref 39.0–52.0)
Hemoglobin: 18.5 g/dL (ref 13.0–17.0)
Lymphocytes Relative: 8.1 % — ABNORMAL LOW (ref 12.0–46.0)
Lymphs Abs: 1.2 10*3/uL (ref 0.7–4.0)
MCHC: 31.8 g/dL (ref 30.0–36.0)
Monocytes Relative: 3.1 % (ref 3.0–12.0)
Neutro Abs: 12.9 10*3/uL — ABNORMAL HIGH (ref 1.4–7.7)
RBC: 6.22 Mil/uL — ABNORMAL HIGH (ref 4.22–5.81)

## 2011-05-07 ENCOUNTER — Telehealth: Payer: Self-pay | Admitting: Internal Medicine

## 2011-05-07 MED ORDER — INSULIN PEN NEEDLE 31G X 5 MM MISC: Status: DC

## 2011-05-07 NOTE — Telephone Encounter (Signed)
Sent needles in. 

## 2011-05-11 ENCOUNTER — Telehealth: Payer: Self-pay | Admitting: Internal Medicine

## 2011-05-11 NOTE — Telephone Encounter (Signed)
Notes Recorded by Sherre Lain, MA on 05/11/2011 at 12:44 PM LMOM TCB x2. ------  Notes Recorded by Sherre Lain, MA on 05/05/2011 at 1:12 PM LMOM TCB x1. Will route lab results and ov note to PCP Dr [name]. Also, do not see documentation about pt having a nephrologist, will ask when pt returns call. ------  Notes Recorded by Rubye Oaks, NP on 05/05/2011 at 12:51 PM Labs are abnormal  He needs ov with his PCP to discuss his blood count to discuss if hematology referral is indicated if not already done. -w/in next 2-3 days  He needs to follow up with nephrologist for his kidney fxn  Keep sleep consult with Dr. Shelle Iron - cont on CPAP  Fax labs to PCP     Called, spoke with pt.  I informed him above per TP and advised we will send results to Dr. [name].  Pt states he will call Dr. Jola Babinski office to schedule a follow up with in the next 2-3 day.  Reports he does see Dr. Darrick Penna at [facility].  He believes he has an upcoming appt with him as well but will call to double check on this.  If he does not have an appt soon with Dr. Darrick Penna, he will schedule this.  I also advised pt to keep sleep consult on March 18 with Dr. Shelle Iron and should cont on CPAP in the meantime.  He verbalized understanding of these instructions and voiced no further questions/cocnerns at this time.  Spoke with Katheren Shams - she has already sent lab results to Dr. [name].

## 2011-05-14 ENCOUNTER — Inpatient Hospital Stay: Payer: Medicare Other | Admitting: Medical

## 2011-05-17 ENCOUNTER — Institutional Professional Consult (permissible substitution): Payer: Medicare Other | Admitting: Pulmonary Disease

## 2011-05-27 ENCOUNTER — Other Ambulatory Visit (INDEPENDENT_AMBULATORY_CARE_PROVIDER_SITE_OTHER): Payer: Medicare Other

## 2011-05-27 ENCOUNTER — Ambulatory Visit (INDEPENDENT_AMBULATORY_CARE_PROVIDER_SITE_OTHER): Payer: Medicare Other | Admitting: Internal Medicine

## 2011-05-27 ENCOUNTER — Encounter: Payer: Self-pay | Admitting: Internal Medicine

## 2011-05-27 VITALS — BP 142/58 | HR 76 | Temp 98.3°F | Ht 68.0 in | Wt 259.0 lb

## 2011-05-27 DIAGNOSIS — N189 Chronic kidney disease, unspecified: Secondary | ICD-10-CM

## 2011-05-27 DIAGNOSIS — J449 Chronic obstructive pulmonary disease, unspecified: Secondary | ICD-10-CM

## 2011-05-27 DIAGNOSIS — J9610 Chronic respiratory failure, unspecified whether with hypoxia or hypercapnia: Secondary | ICD-10-CM

## 2011-05-27 DIAGNOSIS — N184 Chronic kidney disease, stage 4 (severe): Secondary | ICD-10-CM | POA: Insufficient documentation

## 2011-05-27 DIAGNOSIS — D696 Thrombocytopenia, unspecified: Secondary | ICD-10-CM

## 2011-05-27 DIAGNOSIS — F17200 Nicotine dependence, unspecified, uncomplicated: Secondary | ICD-10-CM

## 2011-05-27 LAB — CBC WITH DIFFERENTIAL/PLATELET
Basophils Relative: 0.3 % (ref 0.0–3.0)
Eosinophils Relative: 0.6 % (ref 0.0–5.0)
Lymphocytes Relative: 27.4 % (ref 12.0–46.0)
MCV: 91.5 fl (ref 78.0–100.0)
Monocytes Relative: 8.2 % (ref 3.0–12.0)
Neutrophils Relative %: 63.5 % (ref 43.0–77.0)
Platelets: 45 10*3/uL — CL (ref 150.0–400.0)
RBC: 5.38 Mil/uL (ref 4.22–5.81)
WBC: 10.1 10*3/uL (ref 4.5–10.5)

## 2011-05-27 LAB — BASIC METABOLIC PANEL
BUN: 24 mg/dL — ABNORMAL HIGH (ref 6–23)
CO2: 38 mEq/L — ABNORMAL HIGH (ref 19–32)
Chloride: 92 mEq/L — ABNORMAL LOW (ref 96–112)
Glucose, Bld: 211 mg/dL — ABNORMAL HIGH (ref 70–99)
Potassium: 4.1 mEq/L (ref 3.5–5.1)
Sodium: 142 mEq/L (ref 135–145)

## 2011-05-27 NOTE — Assessment & Plan Note (Signed)
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 - hco3 38 05/27/2011  So hypercarbic as well  GOLD III with chronic hypercarbic and hypoxemic rf so need to titrate 02 to around 90, no higher  DDX of  difficult airways managment all start with A and  include Adherence, Ace Inhibitors, Acid Reflux, Active Sinus Disease, Alpha 1 Antitripsin deficiency, Anxiety masquerading as Airways dz,  ABPA,  allergy(esp in young), Aspiration (esp in elderly), Adverse effects of DPI,  Active smokers, plus two Bs  = Bronchiectasis and Beta blocker use..and one C= CHF  Adherence is always the initial "prime suspect" and is a multilayered concern that requires a "trust but verify" approach in every patient - starting with knowing how to use medications, especially inhalers, correctly, keeping up with refills and understanding the fundamental difference between maintenance and prns vs those medications only taken for a very short course and then stopped and not refilled. He desperately needs meaningful and accurate med reconciliation here.  To keep things simple, I have asked the patient to first separate medicines that are perceived as maintenance, that is to be taken daily "no matter what", from those medicines that are taken on only on an as-needed basis and I have given the patient examples of both, and then return to see our NP to generate a  detailed  medication calendar which should be followed until the next physician sees the patient and updates it.   Active smoking > discussed separately

## 2011-05-27 NOTE — Assessment & Plan Note (Signed)

## 2011-05-27 NOTE — Progress Notes (Deleted)
Subjective:    Patient ID: [name] [name], male    DOB: 03-Nov-1940, 71 y.o.   MRN: [id number]  Shortness of Breath   71 yobm with morbid obesity actively smoking FEV1 of 45% (2009)  - PFTs 10/13/07 FEV1 of 45%, ratio 63%, FVC 15% improvement after bronchodilators DLCO 45%    October 13, 2007 has cut down on smoking now able to walk half a block on 02 2lpm   May 13, 2010 ov Follow up visit-COPD-states wheezing with sleep and weakness. Recent hospital 2/8-10 for pancreatitis some better since discharge. no discolored mucus.      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 Tx with IV abx, steroids, diuresis, BIPAP. Discharged on Levaquin and steroid taper.  Reports breathing has improved since discharge but still has some shortness of breath with exertion along with weakness and fatigue. Minimum cough. Clear mucus production with occasional dark red tinged sputum. BLE edema, greater on right than left. No leg pain. No syncope, dizziness, headaches, vision changes, cp, palpitations or fevers. Uses BiPAP at night and states that he's sleeping much better now than he has in years. Has completed discharge antibiotics. Uses nebulizer's three-  four times daily and is completing current steroid taper.   On 5L continuous O2 and reports smoking 4-6 cigarettes since discharge. Hx  5 ppd smoking at his max.   Admits to smoking inside home and with removal of oxygen in a separate room. Will occasionally leave oxygen system on.  Education provided on smoking cessation. States that he has tried "everything" and that Chantix worked well in the past but can no longer use due to insurance issues.  Extensive education given to not smoke in home with oxygen on. Informed patient that oxygen will be discontinued for his  safety if he continues to smoke with o2 in home.  CXR today showed improved aeration with some residual opacity in RLL   Review of Systems  Respiratory: Positive for shortness of breath.    Constitutional:   No  weight loss, night sweats,  Fevers or chills.  + fatigue and lassitude.  HEENT:   No headaches,  difficulty swallowing,  tooth/dental problems, or  sore throat. No sneezing, itching, ear ache, nasal congestion, post nasal drip,   CV:  No chest pain,  orthopnea, PND, anasarca, dizziness, palpitations, syncope.  + swelling in lower extremities  GI:  No heartburn, indigestion, abdominal pain, nausea, vomiting, diarrhea, change in bowel habits, loss of appetite or bloody stools.   Resp: No shortness of breath at rest.  No excess mucus. No non-productive cough,. No wheezing.  No chest wall deformity + shortness of breath with exertion + productive cough with occasional dark red sputum + productive cough with minimal clear sputum   Skin: no rash or lesions.  GU: no dysuria, change in color of urine, no urgency or frequency.  No flank pain, no hematuria   MS:  No joint pain or swelling.  No decreased range of motion.  No back pain.  Psych:  No change in mood or affect. No depression or anxiety.  No memory loss.     Objective:   Physical Exam GEN: well-nourished, obese, pleasant and cooperative 71 yo AAM in NAD  HEENT:  Oral pharyngeal pharynx clear with no lesions, no postnasal drip or exudate noted.   NECK:  Supple  w/ fair ROM. No JVD. Normal carotid impulses w/o bruits; no thyromegaly or nodules palpated; no lymphadenopathy.  RESP: breath sounds present in all fields. Slightly diminished breath sounds to right lower lobe. No wheezes/ rales/ or rhonchi. No accessory muscle use.  CARD:  RRR. No murmurs, rubs or gallops. 2+ edema to BLE, greater on right than left. Pulses intact. No cyanosis or clubbing.  GI:   Obese. soft & nontender. Bowel sounds present in all quadrants.  No organomegaly or masses detected.  Musco: Warm bil, no deformities or joint swelling noted.   Neuro: alert and oriented x 4. no focal deficits noted.    Skin: Warm and dry. No lesions or rashes     Assessment & Plan:

## 2011-05-27 NOTE — Assessment & Plan Note (Signed)
-   Multifactorial GOLD III copd and ohs -Chronic 02 dep RF - hco3 38 05/27/2011  So hypercarbic as well - ? BIPAP dep at hs at discharge from [facility] on 04/28/11  Referred to Dr Shelle Iron to establish noct needs but for now we need to be absolutely sure [facility] and pulmonary on same page re home rx at hs and during the day.

## 2011-05-27 NOTE — Assessment & Plan Note (Addendum)
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

## 2011-05-27 NOTE — Patient Instructions (Signed)
We will have [employer] adjust your 02 delivery for a saturation around 90% at home and on your portable.  Please remember to go to the lab and x-ray department downstairs for your tests - we will call you with the results when they are available.    See Tammy NP w/in 2 weeks with all your medications, even over the counter meds, separated in two separate bags, the ones you take no matter what vs the ones you stop once you feel better and take only as needed when you feel you need them.   Tammy  will generate for you a new user friendly medication calendar that will put [place] all on the same page re: your medication use.     Without this process, it simply isn't possible to assure that we are providing  your outpatient care  with  the attention to detail we feel you deserve.   If we cannot assure that you're getting that kind of care,  then we cannot manage your problem effectively from this clinic.  Once you have seen Tammy and we are sure that we're all on the same page with your medication use she will arrange follow up with me.

## 2011-05-27 NOTE — Progress Notes (Signed)
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 Tx with IV abx, steroids, diuresis, BIPAP. Discharged on Levaquin and steroid taper.   Cc  breathing has improved since discharge but still has some shortness of breath with exertion along with weakness and fatigue. Minimum cough. Clear mucus production with occasional dark red tinged sputum. BLE edema, greater on right than left. Uses BiPAP at night and states that he's sleeping much better now than he has in years. Has completed discharge antibiotics. Uses nebulizer's three-  four times daily and is completing current steroid taper.   On 5L continuous O2 and reports smoking 4-6 cigarettes since discharge. Hx  5 ppd smoking at his max.   Admits to smoking inside home and with removal of oxygen in a separate room. Will occasionally leave oxygen system on.  CXR >  improved aeration with some residual opacity in RLL  rec Finish Prednisone taper as directed Low salt diet.  Continue Xopenex and Atrovent Neb Four times a day   Very important to stop smoking.  No smoking in house - with oxygen -it is very dangerous-flammable.  Legs elevated.  Follow up Dr. Sherene Sires  In 3 weeks Dr. Sherene Sires   follow up Dr. Shelle Iron as planned for sleep consult.   05/27/2011 f/u ov/[name] [name] cc "cpap working better" (record says it's bipap and he has new machine) wearing 02  at hs and practically all day on 02 at 5lpm breathing better than he has in a year now off acei and actually ran out of  albuterol > 1 week prior to OV  s effect.  Thoroughly confused with names of meds/ machines/ 02 rx.  No purulent sputum. Leg swelling better  Sleeping ok without nocturnal  or early am exacerbation  of respiratory  c/o's or need for noct saba. Also denies any obvious fluctuation of symptoms with weather or environmental changes or other aggravating or alleviating factors except as outlined above   ROS  At present neg for  any significant sore throat, dysphagia, itching, sneezing,  nasal congestion or excess/ purulent secretions,  fever, chills, sweats, unintended wt loss, pleuritic or exertional cp, hempoptysis, orthopnea pnd   Also denies presyncope, palpitations, heartburn, abdominal pain, nausea, vomiting, diarrhea  or change in bowel or urinary habits, dysuria,hematuria,  rash, arthralgias, visual complaints, headache, numbness weakness or ataxia.        Objective:   Physical Exam Wt Readings from Last 3 Encounters:  05/27/11 259 lb (117.482 kg)  05/04/11 257 lb 9.6 oz (116.847 kg)  04/27/11 253 lb 1.8 oz (114.81 kg)    GEN: well-nourished, obese, pleasant and cooperative elderly  AAM in NAD  HEENT:  Oral pharyngeal pharynx clear with no lesions, no postnasal drip or exudate noted.   NECK:  Supple w/ fair ROM. No JVD. Normal carotid impulses w/o bruits; no thyromegaly or nodules palpated; no lymphadenopathy.  RESP: breath sounds present in all fields. Slightly diminished breath sounds to right lower lobe. No wheezes/ rales/ or rhonchi. No accessory muscle  use.  CARD:  RRR. No murmurs, rubs or gallops. trace edema to BLE, greater on right than left. Pulses intact. No cyanosis or clubbing.  GI:   Obese. soft & nontender. Bowel sounds present in all quadrants. No organomegaly or masses detected.  Musco: Warm bil, no deformities or joint swelling noted.   Neuro: alert and oriented x 4. no focal deficits noted.    Skin: Warm and dry. No lesions or rashes  cxr 05/04/11 Still some  residual parenchymal opacity at the right lung base      Assessment & Plan:

## 2011-05-31 ENCOUNTER — Encounter: Payer: Self-pay | Admitting: Internal Medicine

## 2011-05-31 ENCOUNTER — Telehealth: Payer: Self-pay | Admitting: Internal Medicine

## 2011-05-31 NOTE — Telephone Encounter (Signed)
Notes Recorded by Nyoka Cowden, MD on 05/27/2011 at 4:59 PM Call patient : Studies are unremarkable, no change in recs  I spoke with patient about results and he verbalized understanding and had no questions

## 2011-06-04 ENCOUNTER — Ambulatory Visit (INDEPENDENT_AMBULATORY_CARE_PROVIDER_SITE_OTHER): Payer: Medicare Other | Admitting: Pulmonary Disease

## 2011-06-04 ENCOUNTER — Encounter: Payer: Self-pay | Admitting: Pulmonary Disease

## 2011-06-04 VITALS — BP 126/52 | HR 76 | Temp 98.3°F | Ht 68.0 in | Wt 251.0 lb

## 2011-06-04 DIAGNOSIS — G4730 Sleep apnea, unspecified: Secondary | ICD-10-CM

## 2011-06-04 NOTE — Assessment & Plan Note (Signed)
The patient has a long history of obstructive sleep apnea, and is currently on some type of positive pressure device.  Despite wearing this, he is still having significant sleepiness during the day.  Some of this may be secondary to chronic hypercarbia from obesity hypoventilation syndrome, however, he has not had his CPAP pressure optimized in many many years.  He is also using a mask that is 73 years old and probably leaks significantly.  At this point, I would like to get him a new CPAP mask, and also optimize his pressure again at home.  I have also encouraged him to work aggressively on weight loss.

## 2011-06-04 NOTE — Patient Instructions (Signed)
Will get you a new mask Will optimize your pressure with an [employer] machine for 2-3 weeks, and will call you with the results once I receive your download.   Work on weight loss If doing well, will see you back in 6mos.  After that, will see you yearly.

## 2011-06-04 NOTE — Progress Notes (Signed)
  Subjective:    Patient ID: [name] [name], male    DOB: [birth year], 71 y.o.   MRN: [id number]  HPI The patient is a 71 year old male who had been asked to see for management of obstructive sleep apnea.  The patient was diagnosed with sleep apnea over 30 years ago, and has been on a positive pressure device since that time.  He is unsure if it is a bilevel or CPAP device, but thinks that his pressure is between 11 and 13 cm?  He has just gotten a brand-new machine, however he has a mask that is over 32 years old.  He wears the device every night, along with supplemental oxygen.  He gets up every morning at 4 AM, and feels that he is rested more mornings than unrested.  However, the patient falls asleep easily during the day with any periods of inactivity.  This also occurs at night while watching television.  He denies any sleepiness driving shorter distances, but does have issues with longer distances.  He states that his weight is up 30 pounds from 2 years ago, and his Epworth score today is abnormal at 10.  Sleep Questionnaire: What time do you typically go to bed?( Between what hours) midnight How long does it take you to fall asleep? 2+ hours How many times during the night do you wake up? 2 What time do you get out of bed to start your day? 0400 Do you drive or operate heavy machinery in your occupation? No How much has your weight changed (up or down) over the past two years? (In pounds) 20 lb (9.072 kg) Have you ever had a sleep study before? Yes If yes, location of study? [facility] If yes, date of study? 1980s Do you currently use CPAP? Yes If so, what pressure? 11cm? Do you wear oxygen at any time? Yes    Review of Systems  Constitutional: Negative for fever and unexpected weight change.  HENT: Positive for congestion and trouble swallowing. Negative for ear pain, nosebleeds, sore throat, rhinorrhea, sneezing, dental problem, postnasal drip and sinus pressure.   Eyes: Negative for redness and  itching.  Respiratory: Positive for cough and shortness of breath. Negative for chest tightness and wheezing.   Cardiovascular: Positive for leg swelling. Negative for palpitations.  Gastrointestinal: Negative for nausea and vomiting.  Genitourinary: Negative for dysuria.  Musculoskeletal: Positive for joint swelling.  Skin: Negative for rash.  Neurological: Negative for headaches.  Hematological: Does not bruise/bleed easily.  Psychiatric/Behavioral: Negative for dysphoric mood. The patient is not nervous/anxious.        Objective:   Physical Exam Constitutional:  Obese male, no acute distress  HENT:  Nares patent without discharge, but large turbinates  Oropharynx without exudate, palate and uvula are thick and elongated.  Eyes:  Perrla, eomi, no scleral icterus  Neck:  No JVD, no TMG  Cardiovascular:  Normal rate, regular rhythm, no rubs or gallops.  No murmurs        Intact distal pulses  Pulmonary :  Normal breath sounds, no stridor or respiratory distress   No rales, rhonchi, or wheezing  Abdominal:  Soft, nondistended, bowel sounds present.  No tenderness noted.   Musculoskeletal:  No lower extremity edema noted.  Lymph Nodes:  No cervical lymphadenopathy noted  Skin:  No cyanosis noted  Neurologic:  Alert, appropriate, moves all 4 extremities without obvious deficit.         Assessment & Plan:

## 2011-06-07 ENCOUNTER — Encounter: Payer: Medicare Other | Admitting: Adult Health

## 2011-06-22 ENCOUNTER — Encounter: Payer: Self-pay | Admitting: Nephrology

## 2011-07-08 ENCOUNTER — Ambulatory Visit: Payer: Medicare Other | Admitting: Family Medicine

## 2011-08-03 ENCOUNTER — Other Ambulatory Visit: Payer: Self-pay | Admitting: Family Medicine

## 2011-08-03 NOTE — Telephone Encounter (Signed)
Is this okay?

## 2011-08-05 ENCOUNTER — Telehealth: Payer: Self-pay | Admitting: Internal Medicine

## 2011-08-05 NOTE — Telephone Encounter (Signed)
I spoke with pt and he is scheduled to come in and see TP on Monday for his med calendar and will discuss alternatives then

## 2011-08-05 NOTE — Telephone Encounter (Signed)
He is overdue anyway for med rec visit with Tammy and this needs to happen before we consider any alternative rx - if doesn't think he can make it until then we need to let Almyra Free see if she can work something out with his DME company which I believe is [employer].

## 2011-08-05 NOTE — Telephone Encounter (Signed)
I spoke with pt and he states his insurance advised him they are no longer going to cover the Atrovent nebulizer and pt is requesting alternatives. He states they did not give him an alternative to what they will cover. Pt aware MW is out of the office and has enough medication to last him until we can get a response. Please advise MW thanks  No Known Allergies

## 2011-08-09 ENCOUNTER — Encounter: Payer: Medicare Other | Admitting: Adult Health

## 2011-08-14 ENCOUNTER — Other Ambulatory Visit: Payer: Self-pay | Admitting: Pulmonary Disease

## 2011-08-14 DIAGNOSIS — G4733 Obstructive sleep apnea (adult) (pediatric): Secondary | ICD-10-CM

## 2011-08-23 ENCOUNTER — Encounter: Payer: Medicare Other | Admitting: Adult Health

## 2011-08-30 ENCOUNTER — Ambulatory Visit (INDEPENDENT_AMBULATORY_CARE_PROVIDER_SITE_OTHER): Payer: Medicare Other | Admitting: Family Medicine

## 2011-08-30 ENCOUNTER — Encounter: Payer: Self-pay | Admitting: Family Medicine

## 2011-08-30 VITALS — BP 148/70 | HR 76 | Wt 258.0 lb

## 2011-08-30 DIAGNOSIS — J4489 Other specified chronic obstructive pulmonary disease: Secondary | ICD-10-CM

## 2011-08-30 DIAGNOSIS — N189 Chronic kidney disease, unspecified: Secondary | ICD-10-CM

## 2011-08-30 DIAGNOSIS — J449 Chronic obstructive pulmonary disease, unspecified: Secondary | ICD-10-CM

## 2011-08-30 DIAGNOSIS — E785 Hyperlipidemia, unspecified: Secondary | ICD-10-CM

## 2011-08-30 DIAGNOSIS — E119 Type 2 diabetes mellitus without complications: Secondary | ICD-10-CM

## 2011-08-30 DIAGNOSIS — E669 Obesity, unspecified: Secondary | ICD-10-CM

## 2011-08-30 DIAGNOSIS — Z125 Encounter for screening for malignant neoplasm of prostate: Secondary | ICD-10-CM

## 2011-08-30 DIAGNOSIS — I10 Essential (primary) hypertension: Secondary | ICD-10-CM

## 2011-08-30 DIAGNOSIS — G4733 Obstructive sleep apnea (adult) (pediatric): Secondary | ICD-10-CM

## 2011-08-30 DIAGNOSIS — Z79899 Other long term (current) drug therapy: Secondary | ICD-10-CM

## 2011-08-30 DIAGNOSIS — R609 Edema, unspecified: Secondary | ICD-10-CM

## 2011-08-30 LAB — CBC WITH DIFFERENTIAL/PLATELET
HCT: 44.2 % (ref 39.0–52.0)
Hemoglobin: 14.7 g/dL (ref 13.0–17.0)
Lymphs Abs: 2.7 10*3/uL (ref 0.7–4.0)
MCH: 30.2 pg (ref 26.0–34.0)
MCHC: 33.3 g/dL (ref 30.0–36.0)
Monocytes Absolute: 0.6 10*3/uL (ref 0.1–1.0)
Monocytes Relative: 7 % (ref 3–12)
Neutro Abs: 5.7 10*3/uL (ref 1.7–7.7)
Neutrophils Relative %: 63 % (ref 43–77)
RBC: 4.86 MIL/uL (ref 4.22–5.81)

## 2011-08-30 LAB — POCT GLYCOSYLATED HEMOGLOBIN (HGB A1C): Hemoglobin A1C: 8.4

## 2011-08-30 NOTE — Patient Instructions (Signed)
Keep your feet elevated. Use knee-high support stockings

## 2011-08-30 NOTE — Progress Notes (Signed)
  Subjective:    Patient ID: [name] [name], male    DOB: [birth year], 71 y.o.   MRN: [id number]  HPI He is here for an interval evaluation. He was admitted to the hospital and treated for pneumonia. Since then he has followed up with pulmonary. He also has a history of chronic renal insufficiency and apparently is scheduled to see renal. He continues on medications listed in the chart. He states his blood sugars run usually in the mid-100 range with only occasional levels in the 300 range. He recognizes that that's when he eats inappropriately. His main complaint today is a several week history of bilateral foot swelling. He does sit a considerable period of time and notes the swelling gets worse as the day progresses. He's had no chest pain, shortness of breath, DOE. He continues to use his BiPAP and apparently is scheduled to have this rechecked in the near future. He sees urology once year but is not sure why. He continues on oxygen therapy. His activity level is quite limited. He apparently does not have any help around the house and does have some difficulty with his ADLs.   Review of Systems     Objective:   Physical Exam Alert and in no distress. Exam of his lower extremities does show 2+ pitting edema. His nails are slightly thickened. Hemoglobin A1c is 8.4      Assessment & Plan:   1. Encounter for long-term (current) use of other medications  CBC with Differential, Comprehensive metabolic panel, Lipid panel, PSA, Medicare  2. HYPERLIPIDEMIA    3. OBESITY  CBC with Differential  4. HYPERTENSION  CBC with Differential, Comprehensive metabolic panel  5. COPD    6. Chronic renal failure  CBC with Differential, Comprehensive metabolic panel  7. OSA (obstructive sleep apnea)    8. DIABETES MELLITUS, TYPE II  HgB A1c  9. Dependent edema    10. Special screening for malignant neoplasm of prostate  PSA, Medicare  11. Hyperlipidemia LDL goal <70  Lipid panel   I will set him up for home  health agency to come by and see what kind of help he might qualify for. He is also to followup with his ophthalmologist as he is late for that. I will check his PSA and followup on this to save him a trip to urology. Continue on his other medications and he will followup with pulmonary as well as renal.

## 2011-08-31 LAB — COMPREHENSIVE METABOLIC PANEL
Albumin: 3.8 g/dL (ref 3.5–5.2)
BUN: 21 mg/dL (ref 6–23)
CO2: 40 mEq/L — ABNORMAL HIGH (ref 19–32)
Calcium: 9.8 mg/dL (ref 8.4–10.5)
Glucose, Bld: 163 mg/dL — ABNORMAL HIGH (ref 70–99)
Potassium: 3.7 mEq/L (ref 3.5–5.3)
Sodium: 141 mEq/L (ref 135–145)
Total Protein: 6.8 g/dL (ref 6.0–8.3)

## 2011-08-31 LAB — LIPID PANEL
Cholesterol: 173 mg/dL (ref 0–200)
Triglycerides: 228 mg/dL — ABNORMAL HIGH (ref <150)

## 2011-09-10 ENCOUNTER — Encounter: Payer: Self-pay | Admitting: Adult Health

## 2011-09-10 ENCOUNTER — Ambulatory Visit (INDEPENDENT_AMBULATORY_CARE_PROVIDER_SITE_OTHER)
Admission: RE | Admit: 2011-09-10 | Discharge: 2011-09-10 | Disposition: A | Discharge status: Home / Self Care | Payer: Medicare Other | Source: Ambulatory Visit | Attending: Adult Health | Admitting: Adult Health

## 2011-09-10 ENCOUNTER — Ambulatory Visit (INDEPENDENT_AMBULATORY_CARE_PROVIDER_SITE_OTHER): Payer: Medicare Other | Admitting: Adult Health

## 2011-09-10 VITALS — BP 136/64 | HR 78 | Temp 97.8°F | Ht 68.0 in | Wt 251.0 lb

## 2011-09-10 DIAGNOSIS — J449 Chronic obstructive pulmonary disease, unspecified: Secondary | ICD-10-CM

## 2011-09-10 MED ORDER — BUDESONIDE 0.25 MG/2ML IN SUSP: 0.2500 mg | Freq: Two times a day (BID) | RESPIRATORY_TRACT | Status: DC

## 2011-09-10 MED ORDER — IPRATROPIUM BROMIDE 0.02 % IN SOLN: 0.5000 mg | Freq: Four times a day (QID) | RESPIRATORY_TRACT | Status: DC

## 2011-09-10 NOTE — Assessment & Plan Note (Signed)
  Patient's medications were reviewed today and patient education was given. Computerized medication calendar was adjusted/completed  Plan:  Med calendar done  add Budesonide Neb 0.25mg  Twice daily   Continue on Ipratropium Neb Four times a day  (sent rx to DME company -[facility] )  May get saline nasal spray for nasal stuffiness May get mucinex DM Twice daily  As needed  Cough/congestion  Follow med calendar closely and bring to each visit.  follow up Dr. Sherene Sires  In  3 months and As needed   I will call with xray results.

## 2011-09-10 NOTE — Progress Notes (Signed)
* Subjective:    Patient ID: [name] [name], male    DOB: 13-Nov-1940    MRN: [id number]  HPI 31 yobm with morbid obesity actively smoking followed in pulmonary clinic for copd/ chronic resp failure - PFTs 10/13/07 FEV1 of 45%, ratio 63%, FVC 15% improvement after bronchodilators DLCO 45%    05/04/2011 Post Hospital follow up  Admitted 04/16/2011 and discharged 04/28/2011 for acute on chronic hypercarbic/hypoxic respiratory failure, PNA -RLL, volume overload/pulmonary edema w/ decompensated on cor pulmonale ,AECOPD vs acei effect w/ ongoing smoking. Tx with IV abx, steroids, diuresis, BIPAP. Discharged on Levaquin and steroid taper.   Cc  breathing has improved since discharge but still has some shortness of breath with exertion along with weakness and fatigue. Minimum cough. Clear mucus production with occasional dark red tinged sputum. BLE edema, greater on right than left. Uses BiPAP at night and states that he's sleeping much better now than he has in years. Has completed discharge antibiotics. Uses nebulizer's three-  four times daily and is completing current steroid taper.   On 5L continuous O2 and reports smoking 4-6 cigarettes since discharge. Hx  5 ppd smoking at his max.   Admits to smoking inside home and with removal of oxygen in a separate room. Will occasionally leave oxygen system on.  CXR >  improved aeration with some residual opacity in RLL  rec Finish Prednisone taper as directed Low salt diet.  Continue Xopenex and Atrovent Neb Four times a day   Very important to stop smoking.  No smoking in house - with oxygen -it is very dangerous-flammable.  Legs elevated.  Follow up Dr. Sherene Sires  In 3 weeks Dr. Sherene Sires   follow up Dr. Shelle Iron as planned for sleep consult.   05/27/2011 f/u ov/Wert cc "cpap working better" (record says it's bipap and he has new machine) wearing 02  at hs and practically all day on 02 at 5lpm breathing better than he has in a year now off acei and actually ran out of  albuterol > 1 week prior to OV  s effect.  Thoroughly confused with names of meds/ machines/ 02 rx.  No purulent sputum. Leg swelling better >>labs-improved renal fxn off ace  and xray -w / residual opacity at right base.   09/10/2011 Follow up and Med review  Returns for a three-month followup and medication review. We reviewed all his medications and organized them into a medication calendar. Says that he is at his baseline. Has daily cough and congestion mainly clear to white mucus. Continues to smoke with no plans on quitting. Smoking cessation discussed. Is having difficulty affording his ipratropium nebulizer through the pharmacy. We discussed getting his nebulizer through the DME company He denies any chest pain, orthopnea, PND, or increased leg swelling      ROS    Constitutional:   No  weight loss, night sweats,  Fevers, chills, + fatigue, or  lassitude.  HEENT:   No headaches,  Difficulty swallowing,  Tooth/dental problems, or  Sore throat,                No sneezing, itching, ear ache, nasal congestion, post nasal drip,   CV:  No chest pain,  Orthopnea, PND,   anasarca, dizziness, palpitations, syncope.   GI  No heartburn, indigestion, abdominal pain, nausea, vomiting, diarrhea, change in bowel habits, loss of appetite, bloody stools.   Resp:   No coughing up of blood.   No chest wall deformity  Skin: no rash  or lesions.  GU: no dysuria, change in color of urine, no urgency or frequency.  No flank pain, no hematuria   MS:  No joint pain or swelling.  No decreased range of motion.  No back pain.  Psych:  No change in mood or affect. No depression or anxiety.  No memory loss.           Objective:     GEN: obese,   elderly  AAM in NAD  HEENT:  Oral pharyngeal pharynx clear with no lesions, no postnasal drip or exudate noted.   NECK:  Supple w/ fair ROM. No JVD. Normal carotid impulses w/o bruits; no thyromegaly or nodules palpated; no lymphadenopathy.  RESP:   Coarse BS , no wheezing   CARD:  RRR. No murmurs, rubs or gallops. trace edema to BLE pulses intact. No cyanosis or clubbing.  GI:   Obese. soft & nontender. Bowel sounds present in all quadrants. No organomegaly or masses detected.  Musco: Warm bil, no deformities or joint swelling noted.   Neuro: alert and oriented x 4. no focal deficits noted.    Skin: Warm and dry. No lesions or rashes      Assessment & Plan:

## 2011-09-10 NOTE — Patient Instructions (Addendum)
Add Budesonide Neb 0.25mg  Twice daily   Continue on Ipratropium Neb Four times a day  (sent rx to DME company -[facility] )  May get saline nasal spray for nasal stuffiness May get mucinex DM Twice daily  As needed  Cough/congestion  Follow med calendar closely and bring to each visit.  follow up Dr. Sherene Sires  In  3 months and As needed   I will call with xray results.

## 2011-09-13 NOTE — Addendum Note (Signed)
Addended by: Boone Master E on: 09/13/2011 09:45 AM   Modules accepted: Orders

## 2011-10-03 ENCOUNTER — Other Ambulatory Visit: Payer: Self-pay | Admitting: Family Medicine

## 2011-11-02 ENCOUNTER — Encounter: Payer: Self-pay | Admitting: Gastroenterology

## 2011-12-10 ENCOUNTER — Ambulatory Visit: Payer: Medicare Other | Admitting: Pulmonary Disease

## 2011-12-16 ENCOUNTER — Encounter: Payer: Medicare Other | Admitting: Internal Medicine

## 2011-12-16 NOTE — Progress Notes (Signed)
 This encounter was created in error - please disregard.

## 2011-12-18 ENCOUNTER — Encounter (HOSPITAL_COMMUNITY): Payer: Self-pay | Admitting: Emergency Medicine

## 2011-12-18 ENCOUNTER — Emergency Department (HOSPITAL_COMMUNITY)
Admission: EM | Admit: 2011-12-18 | Discharge: 2011-12-18 | Disposition: A | Discharge status: Home / Self Care | Payer: Medicare Other | Attending: Emergency Medicine | Admitting: Emergency Medicine

## 2011-12-18 ENCOUNTER — Emergency Department (HOSPITAL_COMMUNITY): Payer: Medicare Other

## 2011-12-18 DIAGNOSIS — Z794 Long term (current) use of insulin: Secondary | ICD-10-CM | POA: Insufficient documentation

## 2011-12-18 DIAGNOSIS — R0602 Shortness of breath: Secondary | ICD-10-CM | POA: Insufficient documentation

## 2011-12-18 DIAGNOSIS — J449 Chronic obstructive pulmonary disease, unspecified: Secondary | ICD-10-CM | POA: Insufficient documentation

## 2011-12-18 DIAGNOSIS — I129 Hypertensive chronic kidney disease with stage 1 through stage 4 chronic kidney disease, or unspecified chronic kidney disease: Secondary | ICD-10-CM | POA: Insufficient documentation

## 2011-12-18 DIAGNOSIS — J4489 Other specified chronic obstructive pulmonary disease: Secondary | ICD-10-CM | POA: Insufficient documentation

## 2011-12-18 DIAGNOSIS — N183 Chronic kidney disease, stage 3 unspecified: Secondary | ICD-10-CM | POA: Insufficient documentation

## 2011-12-18 DIAGNOSIS — I10 Essential (primary) hypertension: Secondary | ICD-10-CM

## 2011-12-18 DIAGNOSIS — E119 Type 2 diabetes mellitus without complications: Secondary | ICD-10-CM | POA: Insufficient documentation

## 2011-12-18 DIAGNOSIS — Z79899 Other long term (current) drug therapy: Secondary | ICD-10-CM | POA: Insufficient documentation

## 2011-12-18 LAB — URINALYSIS, ROUTINE W REFLEX MICROSCOPIC
Leukocytes, UA: NEGATIVE
Nitrite: NEGATIVE
Specific Gravity, Urine: 1.024 (ref 1.005–1.030)
pH: 6 (ref 5.0–8.0)

## 2011-12-18 LAB — DIFFERENTIAL
Basophils Relative: 0 % (ref 0–1)
Eosinophils Absolute: 0.1 10*3/uL (ref 0.0–0.7)
Neutrophils Relative %: 61 % (ref 43–77)

## 2011-12-18 LAB — URINE MICROSCOPIC-ADD ON

## 2011-12-18 LAB — CBC
HCT: 43.3 % (ref 39.0–52.0)
Platelets: 53 10*3/uL — ABNORMAL LOW (ref 150–400)
RBC: 4.45 MIL/uL (ref 4.22–5.81)
RDW: 13 % (ref 11.5–15.5)
WBC: 9.2 10*3/uL (ref 4.0–10.5)

## 2011-12-18 LAB — BASIC METABOLIC PANEL
BUN: 17 mg/dL (ref 6–23)
Chloride: 89 mEq/L — ABNORMAL LOW (ref 96–112)
GFR calc Af Amer: 38 mL/min — ABNORMAL LOW (ref >90)
Potassium: 3.9 mEq/L (ref 3.5–5.1)

## 2011-12-18 MED ORDER — INSULIN GLULISINE 100 UNIT/ML ~~LOC~~ SOLN: 15.0000 [IU] | Freq: Three times a day (TID) | SUBCUTANEOUS | Status: DC

## 2011-12-18 MED ORDER — ALBUTEROL SULFATE (2.5 MG/3ML) 0.083% IN NEBU: 5.0000 mg | INHALATION_SOLUTION | RESPIRATORY_TRACT | Status: DC | PRN

## 2011-12-18 MED ORDER — BISOPROLOL FUMARATE 5 MG PO TABS: 5.0000 mg | ORAL_TABLET | Freq: Two times a day (BID) | ORAL | Status: DC

## 2011-12-18 MED ORDER — INSULIN ASPART 100 UNIT/ML ~~LOC~~ SOLN: 15.0000 [IU] | Freq: Three times a day (TID) | SUBCUTANEOUS | Status: DC

## 2011-12-18 MED ORDER — ALBUTEROL SULFATE (5 MG/ML) 0.5% IN NEBU
2.5000 mg | INHALATION_SOLUTION | Freq: Once | RESPIRATORY_TRACT | Status: AC
  Administered 2011-12-18: 2.5 mg via RESPIRATORY_TRACT
  Filled 2011-12-18: qty 0.5

## 2011-12-18 MED ORDER — BUDESONIDE 0.25 MG/2ML IN SUSP: 0.2500 mg | Freq: Two times a day (BID) | RESPIRATORY_TRACT | Status: DC

## 2011-12-18 MED ORDER — IPRATROPIUM BROMIDE 0.02 % IN SOLN
0.5000 mg | Freq: Once | RESPIRATORY_TRACT | Status: AC
  Administered 2011-12-18: 0.5 mg via RESPIRATORY_TRACT
  Filled 2011-12-18: qty 2.5

## 2011-12-18 MED ORDER — FUROSEMIDE 40 MG PO TABS: 40.0000 mg | ORAL_TABLET | Freq: Two times a day (BID) | ORAL | Status: DC

## 2011-12-18 NOTE — ED Notes (Signed)
[id number]<AV> Expected date:12/18/11<BR> Expected time: 1:20 PM<BR> Means of arrival:Ambulance<BR> Comments:<BR> Hyperglycemia

## 2011-12-18 NOTE — ED Notes (Signed)
Patient taken to the pharmacy via wheelchair to obtain medications to take home with him.

## 2011-12-18 NOTE — ED Notes (Addendum)
Reports multiple complaints ongoing for couple of months. Congestive cough, sob, unable to use home O2 due to tubing  getting tangle up x2 days, decrease in appetite, prescriptions out for asthma,diabetics, bp & cholesterol.  Due to tachypnea @ rest implemented int with blood drawn. Placed on cardiac monitor showing NS without ectopic. Periorbita & lower extremities swollen.

## 2011-12-18 NOTE — ED Notes (Signed)
Communications contacted for patient transportation back home.

## 2011-12-18 NOTE — ED Provider Notes (Signed)
History     CSN: [id number]  Arrival date & time 12/18/11  1330   First MD Initiated Contact with Patient 12/18/11 1359      Chief Complaint  Patient presents with  . Weakness    (Consider location/radiation/quality/duration/timing/severity/associated sxs/prior treatment) Patient is a 71 y.o. male presenting with weakness. The history is provided by the patient.  Weakness Primary symptoms do not include headaches, focal weakness, fever, nausea or vomiting.  Additional symptoms include weakness. Associated symptoms comments: He reports SOB and generalized weakness for several days. He was not able to fill his medication prescriptions at the beginning of the month and is out of his Apidra insulin (he has Lantus), Zebeta 5mg , and albuterol for his nebulizer machine. He states that his SOB is per his usual and he is not requiring increased oxygen over his regular 5L for COPD. He has been taking his Lantus insulin as prescribed but this is not controlling his blood sugar completely. He reports he has been taking his blood pressure medication sporadically to try to make it last. .    Past Medical History  Diagnosis Date  . Renal insufficiency   . Diabetes mellitus   . GERD (gastroesophageal reflux disease)   . Gastroparesis   . Esophageal stricture   . Hyperlipemia   . Hypertension   . Pancreatitis   . Chronic respiratory disease   . Depression   . Seizure disorder   . Arthritis   . Diverticulosis   . Sleep apnea     CPAP Machine  . Obesity   . ED (erectile dysfunction)   . Sleep apnea   . COPD (chronic obstructive pulmonary disease)     Gold Stage  III/IV  . H/O polycythemia   . Chronic kidney disease (CKD), stage III (moderate)   . DDD (degenerative disc disease), lumbar   . History of esophageal stricture   . Erectile dysfunction   . Prostate cancer   . Peptic ulcer disease   . History of colon polyps   . Gout   . Gastroparesis     Past Surgical History    Procedure Date  . Prostate surgery   . Coronary artery bypass graft     x 4  . Bone marrow biopsy   . Cardiac catheterization 05/02/2008  . Back surgery     lumbar    Family History  Problem Relation Age of Onset  . Colon cancer Neg Hx   . Cancer Brother     unsure what kind  . Heart disease Mother   . Heart disease Father   . Emphysema Father     History  Substance Use Topics  . Smoking status: Current Every Day Smoker -- 1.0 packs/day for 60 years    Types: Cigarettes  . Smokeless tobacco: Never Used  . Alcohol Use: No      Review of Systems  Constitutional: Negative for fever.  Respiratory: Positive for cough, chest tightness and shortness of breath.   Cardiovascular: Negative for chest pain.  Gastrointestinal: Negative for nausea, vomiting and abdominal pain.  Genitourinary: Negative for dysuria.  Musculoskeletal: Negative.   Skin: Negative.   Neurological: Positive for weakness. Negative for focal weakness and headaches.  Psychiatric/Behavioral: Negative for confusion.    Allergies  Review of patient's allergies indicates no known allergies.  Home Medications   Current Outpatient Rx  Name Route Sig Dispense Refill  . ACETAMINOPHEN ER 650 MG PO TBCR Oral Take 650 mg by mouth every 8 (eight)  hours as needed. Per bottle as needed for pain    . ALBUTEROL SULFATE (2.5 MG/3ML) 0.083% IN NEBU Nebulization Take 2.5 mg by nebulization every 4 (four) hours as needed. Shortness of breath    . BISOPROLOL FUMARATE 5 MG PO TABS Oral Take 1 tablet (5 mg total) by mouth 2 (two) times daily. 60 tablet 6  . BUDESONIDE 0.25 MG/2ML IN SUSP Nebulization Take 2 mLs (0.25 mg total) by nebulization 2 (two) times daily. DX: 496 60 mL 5  . CALCITRIOL 0.25 MCG PO CAPS Oral Take 0.25 mcg by mouth daily.    . FUROSEMIDE 40 MG PO TABS Oral Take 80-1,200 mg by mouth 2 (two) times daily. Take 3 tablets in the morning and 2 in the evening    . INSULIN GLARGINE 100 UNIT/ML [place] SOLN  Subcutaneous Inject 10 Units into the skin 2 (two) times daily.    . INSULIN GLULISINE 100 UNIT/ML [place] SOLN Subcutaneous Inject 15 Units into the skin 3 (three) times daily before meals.    . INSULIN PEN NEEDLE 31G X 5 MM MISC Does not apply by Does not apply route. Use as directed    . INSULIN PEN NEEDLE 31G X 5 MM MISC  Use as directed 100 each prn  . IPRATROPIUM BROMIDE 0.02 % IN SOLN Nebulization Take 2.5 mLs (0.5 mg total) by nebulization 4 (four) times daily. DX: 496 300 mL 5  . PANTOPRAZOLE SODIUM 40 MG PO TBEC Oral Take 1 tablet (40 mg total) by mouth daily. 30 tablet 11  . PARICALCITOL 2 MCG PO CAPS Oral Take 1 capsule (2 mcg total) by mouth every other day. 15 capsule 11  . SIMVASTATIN 40 MG PO TABS Oral Take 1 tablet (40 mg total) by mouth at bedtime. Once daily 30 tablet 11    BP 141/65  Pulse 81  Temp 98.1 F (36.7 C) (Oral)  Resp 30  SpO2 95%  Physical Exam  Constitutional: He is oriented to person, place, and time. He appears well-developed and well-nourished.  HENT:  Head: Normocephalic.  Neck: Normal range of motion. Neck supple.  Cardiovascular: Normal rate and regular rhythm.   No murmur heard. Pulmonary/Chest: Effort normal. He has wheezes. He has rales. He exhibits no tenderness.  Abdominal: Soft. Bowel sounds are normal. There is no tenderness. There is no rebound and no guarding.  Musculoskeletal: Normal range of motion. He exhibits edema.       1-2+ pitting edema bilateral lower extremities.  Neurological: He is alert and oriented to person, place, and time.  Skin: Skin is warm and dry. No rash noted.  Psychiatric: He has a normal mood and affect.    ED Course  Procedures (including critical care time)  Labs Reviewed  GLUCOSE, CAPILLARY - Abnormal; Notable for the following:    Glucose-Capillary 352 (*)     All other components within normal limits  CBC - Abnormal; Notable for the following:    Hemoglobin 12.8 (*)     MCHC 29.6 (*)     Platelets 53  (*)     All other components within normal limits  BASIC METABOLIC PANEL - Abnormal; Notable for the following:    Chloride 89 (*)     CO2 >45 (*)     Glucose, Bld 383 (*)     Creatinine, Ser 1.91 (*)     GFR calc non Af Amer 33 (*)     GFR calc Af Amer 38 (*)     All other  components within normal limits  URINALYSIS, ROUTINE W REFLEX MICROSCOPIC - Abnormal; Notable for the following:    APPearance CLOUDY (*)     Glucose, UA >1000 (*)     Protein, ur 30 (*)     All other components within normal limits  DIFFERENTIAL - Abnormal; Notable for the following:    Monocytes Absolute 1.1 (*)     All other components within normal limits  URINE MICROSCOPIC-ADD ON   Dg Chest 2 View  12/18/2011  *RADIOLOGY REPORT*  Clinical Data: Shortness of breath, weakness  CHEST - 2 VIEW  Comparison: 09/10/2011  Findings: Cardiomediastinal silhouette is stable.  Mild degenerative changes thoracic spine again noted.  No acute infiltrate or pleural effusion.  No pulmonary edema.  Mild hyperinflation.  IMPRESSION: No active disease.  No significant change.   Original Report Authenticated By: Natasha Mead, M.D.      No diagnosis found.  1. COPD 2. Diabetes 3. Hyperglycemia   MDM  Patient given meal and breathing treatment. He states that he feels his symptoms are related to his being out of his usual medications. He has been resting without complaint since receiving nebulizer. Discussed with Case mgr who approves of providing needed prescriptions until patient can fill his own or follow up with his doctor.         Rodena Medin, PA-C 12/18/11 1835

## 2011-12-19 NOTE — ED Provider Notes (Signed)
Medical screening examination/treatment/procedure(s) were performed by non-physician practitioner and as supervising physician I was immediately available for consultation/collaboration.  Tobin Chad, MD 12/19/11 [id number]

## 2011-12-24 ENCOUNTER — Telehealth: Payer: Self-pay | Admitting: Family Medicine

## 2011-12-24 MED ORDER — BISOPROLOL FUMARATE 5 MG PO TABS: 5.0000 mg | ORAL_TABLET | Freq: Two times a day (BID) | ORAL | Status: DC

## 2011-12-24 MED ORDER — INSULIN PEN NEEDLE 31G X 5 MM MISC: 31.0000 g | Freq: Once | Status: DC

## 2011-12-24 MED ORDER — CALCITRIOL 0.25 MCG PO CAPS: 0.2500 ug | ORAL_CAPSULE | Freq: Every day | ORAL | Status: DC

## 2011-12-24 NOTE — Telephone Encounter (Signed)
I CALLED AND CONFIRMED REFILLS WITH THE PHARMACY AND WE REFILLED HIS BP MEDICATION AND VITAMIN D WITH HIS INSULIN NEEDLES. CLS PATIENT IS AWARE. CLS

## 2011-12-28 ENCOUNTER — Other Ambulatory Visit: Payer: Self-pay | Admitting: Family Medicine

## 2011-12-28 MED ORDER — INSULIN GLULISINE 100 UNIT/ML ~~LOC~~ SOLN: 15.0000 [IU] | Freq: Three times a day (TID) | SUBCUTANEOUS | Status: DC

## 2012-01-01 ENCOUNTER — Other Ambulatory Visit: Payer: Self-pay | Admitting: Family Medicine

## 2012-01-03 NOTE — Telephone Encounter (Signed)
He needs a follow-up appointment

## 2012-01-03 NOTE — Telephone Encounter (Signed)
Is this ok?

## 2012-01-18 ENCOUNTER — Telehealth: Payer: Self-pay | Admitting: Internal Medicine

## 2012-01-18 NOTE — Telephone Encounter (Signed)
Tried to call patient and was unable to leave a message on [employer]

## 2012-01-20 ENCOUNTER — Other Ambulatory Visit: Payer: Self-pay | Admitting: Family Medicine

## 2012-01-20 NOTE — Telephone Encounter (Signed)
Is this ok?

## 2012-01-25 ENCOUNTER — Telehealth: Payer: Self-pay | Admitting: Internal Medicine

## 2012-01-25 ENCOUNTER — Encounter: Payer: Medicare Other | Admitting: Internal Medicine

## 2012-01-25 DIAGNOSIS — J449 Chronic obstructive pulmonary disease, unspecified: Secondary | ICD-10-CM

## 2012-01-25 MED ORDER — ALBUTEROL SULFATE (2.5 MG/3ML) 0.083% IN NEBU: 5.0000 mg | INHALATION_SOLUTION | RESPIRATORY_TRACT | Status: DC | PRN

## 2012-01-25 MED ORDER — IPRATROPIUM BROMIDE 0.02 % IN SOLN: 0.5000 mg | Freq: Four times a day (QID) | RESPIRATORY_TRACT | Status: DC

## 2012-01-25 NOTE — Telephone Encounter (Signed)
(  continued)  Pt states it takes him a while to answer the phone, so please call right back if he does not answer.  Thanks.  Antionette Fairy

## 2012-01-25 NOTE — Telephone Encounter (Signed)
i spoke with pt and is aware his nebulizer medications have been sent to [facility]. Nothing further was needed

## 2012-01-25 NOTE — Progress Notes (Signed)
 This encounter was created in error - please disregard.

## 2012-02-11 ENCOUNTER — Ambulatory Visit: Payer: Medicare Other | Admitting: Internal Medicine

## 2012-02-16 ENCOUNTER — Telehealth: Payer: Self-pay | Admitting: Family Medicine

## 2012-02-16 ENCOUNTER — Emergency Department (HOSPITAL_COMMUNITY): Payer: Medicare Other

## 2012-02-16 ENCOUNTER — Ambulatory Visit: Payer: Medicare Other | Admitting: Internal Medicine

## 2012-02-16 ENCOUNTER — Emergency Department (HOSPITAL_COMMUNITY)
Admission: EM | Admit: 2012-02-16 | Discharge: 2012-02-16 | Disposition: A | Discharge status: Home / Self Care | Payer: Medicare Other | Attending: Emergency Medicine | Admitting: Emergency Medicine

## 2012-02-16 ENCOUNTER — Encounter (HOSPITAL_COMMUNITY): Payer: Self-pay | Admitting: Emergency Medicine

## 2012-02-16 ENCOUNTER — Ambulatory Visit (INDEPENDENT_AMBULATORY_CARE_PROVIDER_SITE_OTHER): Payer: Medicare Other | Admitting: Family Medicine

## 2012-02-16 DIAGNOSIS — Z8669 Personal history of other diseases of the nervous system and sense organs: Secondary | ICD-10-CM | POA: Insufficient documentation

## 2012-02-16 DIAGNOSIS — J449 Chronic obstructive pulmonary disease, unspecified: Secondary | ICD-10-CM | POA: Insufficient documentation

## 2012-02-16 DIAGNOSIS — J4489 Other specified chronic obstructive pulmonary disease: Secondary | ICD-10-CM | POA: Insufficient documentation

## 2012-02-16 DIAGNOSIS — R609 Edema, unspecified: Secondary | ICD-10-CM | POA: Insufficient documentation

## 2012-02-16 DIAGNOSIS — E669 Obesity, unspecified: Secondary | ICD-10-CM | POA: Insufficient documentation

## 2012-02-16 DIAGNOSIS — Z860100 Personal history of colon polyps, unspecified: Secondary | ICD-10-CM | POA: Insufficient documentation

## 2012-02-16 DIAGNOSIS — N183 Chronic kidney disease, stage 3 unspecified: Secondary | ICD-10-CM | POA: Insufficient documentation

## 2012-02-16 DIAGNOSIS — Z794 Long term (current) use of insulin: Secondary | ICD-10-CM | POA: Insufficient documentation

## 2012-02-16 DIAGNOSIS — M7989 Other specified soft tissue disorders: Secondary | ICD-10-CM | POA: Insufficient documentation

## 2012-02-16 DIAGNOSIS — E785 Hyperlipidemia, unspecified: Secondary | ICD-10-CM | POA: Insufficient documentation

## 2012-02-16 DIAGNOSIS — F17200 Nicotine dependence, unspecified, uncomplicated: Secondary | ICD-10-CM | POA: Insufficient documentation

## 2012-02-16 DIAGNOSIS — E119 Type 2 diabetes mellitus without complications: Secondary | ICD-10-CM | POA: Insufficient documentation

## 2012-02-16 DIAGNOSIS — M109 Gout, unspecified: Secondary | ICD-10-CM | POA: Insufficient documentation

## 2012-02-16 DIAGNOSIS — Z8546 Personal history of malignant neoplasm of prostate: Secondary | ICD-10-CM | POA: Insufficient documentation

## 2012-02-16 DIAGNOSIS — K219 Gastro-esophageal reflux disease without esophagitis: Secondary | ICD-10-CM | POA: Insufficient documentation

## 2012-02-16 DIAGNOSIS — N289 Disorder of kidney and ureter, unspecified: Secondary | ICD-10-CM | POA: Insufficient documentation

## 2012-02-16 DIAGNOSIS — Z049 Encounter for examination and observation for unspecified reason: Secondary | ICD-10-CM

## 2012-02-16 DIAGNOSIS — Z8711 Personal history of peptic ulcer disease: Secondary | ICD-10-CM | POA: Insufficient documentation

## 2012-02-16 DIAGNOSIS — Z8739 Personal history of other diseases of the musculoskeletal system and connective tissue: Secondary | ICD-10-CM | POA: Insufficient documentation

## 2012-02-16 DIAGNOSIS — I129 Hypertensive chronic kidney disease with stage 1 through stage 4 chronic kidney disease, or unspecified chronic kidney disease: Secondary | ICD-10-CM | POA: Insufficient documentation

## 2012-02-16 DIAGNOSIS — R600 Localized edema: Secondary | ICD-10-CM

## 2012-02-16 DIAGNOSIS — Z8719 Personal history of other diseases of the digestive system: Secondary | ICD-10-CM | POA: Insufficient documentation

## 2012-02-16 DIAGNOSIS — Z79899 Other long term (current) drug therapy: Secondary | ICD-10-CM | POA: Insufficient documentation

## 2012-02-16 LAB — COMPREHENSIVE METABOLIC PANEL
ALT: 13 U/L (ref 0–53)
AST: 15 U/L (ref 0–37)
Alkaline Phosphatase: 98 U/L (ref 39–117)
GFR calc Af Amer: 42 mL/min — ABNORMAL LOW (ref >90)
Glucose, Bld: 57 mg/dL — ABNORMAL LOW (ref 70–99)
Potassium: 3.9 mEq/L (ref 3.5–5.1)
Sodium: 143 mEq/L (ref 135–145)
Total Protein: 6.9 g/dL (ref 6.0–8.3)

## 2012-02-16 LAB — CBC WITH DIFFERENTIAL/PLATELET
Eosinophils Absolute: 0.1 10*3/uL (ref 0.0–0.7)
Lymphocytes Relative: 29 % (ref 12–46)
Lymphs Abs: 2.2 10*3/uL (ref 0.7–4.0)
MCH: 28.1 pg (ref 26.0–34.0)
Neutrophils Relative %: 63 % (ref 43–77)
Platelets: DECREASED 10*3/uL (ref 150–400)
RBC: 4.45 MIL/uL (ref 4.22–5.81)
WBC: 7.7 10*3/uL (ref 4.0–10.5)

## 2012-02-16 LAB — GLUCOSE, CAPILLARY

## 2012-02-16 NOTE — Discharge Instructions (Signed)
You were seen and evaluated for your increased lower leg swelling. Continue your normal medications as prescribed and elevate your legs above your heart as often as possible. Please call your primary care provider tomorrow to schedule close followup appointment and discuss any adjustments in your medications. Return to emergency room if you develop any worsening symptoms, increase shortness of breath or chest pain.    Peripheral Edema You have swelling in your legs (peripheral edema). This swelling is due to excess accumulation of salt and water in your body. Edema may be a sign of heart, kidney or liver disease, or a side effect of a medication. It may also be due to problems in the leg veins. Elevating your legs and using special support stockings may be very helpful, if the cause of the swelling is due to poor venous circulation. Avoid long periods of standing, whatever the cause. Treatment of edema depends on identifying the cause. Chips, pretzels, pickles and other salty foods should be avoided. Restricting salt in your diet is almost always needed. Water pills (diuretics) are often used to remove the excess salt and water from your body via urine. These medicines prevent the kidney from reabsorbing sodium. This increases urine flow. Diuretic treatment may also result in lowering of potassium levels in your body. Potassium supplements may be needed if you have to use diuretics daily. Daily weights can help you keep track of your progress in clearing your edema. You should call your caregiver for follow up care as recommended. SEEK IMMEDIATE MEDICAL CARE IF:   You have increased swelling, pain, redness, or heat in your legs.  You develop shortness of breath, especially when lying down.  You develop chest or abdominal pain, weakness, or fainting.  You have a fever. Document Released: 03/25/2004 Document Revised: 05/10/2011 Document Reviewed: 03/05/2009 [facility] Patient Information 2013  [place], [place].

## 2012-02-16 NOTE — ED Provider Notes (Signed)
History     CSN: [id number]  Arrival date & time 02/16/12  1619   First MD Initiated Contact with Patient 02/16/12 1811      Chief Complaint  Patient presents with  . Shortness of Breath    (Consider location/radiation/quality/duration/timing/severity/associated sxs/prior treatment) HPI Comments: Patient with multiple chronic medical conditions including COPD on 5 L home O2, renal insufficiency -- presents with his daughter who was concerned about his lower extremity edema that was worse. Daughter was away in [place] and recently returned. Patient states that his lower extremity edema has been gradually worsening but is not acutely worse. He takes Lasix 80 mg and Lasix 120 mg daily. Patient states that his shortness of breath is at baseline. He denies fever, cough, chest pain. He denies abdominal pain, nausea, vomiting, or diarrhea. No change in urination. No dysuria or hematuria. Patient uses albuterol nebulizer at home however his machine has been broken. Onset of symptoms insidious. Course is constant. Nothing makes symptoms better or worse.  Patient is a 71 y.o. male presenting with shortness of breath. The history is provided by the patient and medical records.  Shortness of Breath  Associated symptoms include shortness of breath. Pertinent negatives include no chest pain, no fever, no rhinorrhea, no sore throat and no cough.    Past Medical History  Diagnosis Date  . Renal insufficiency   . Diabetes mellitus   . GERD (gastroesophageal reflux disease)   . Gastroparesis   . Esophageal stricture   . Hyperlipemia   . Hypertension   . Pancreatitis   . Chronic respiratory disease   . Depression   . Seizure disorder   . Arthritis   . Diverticulosis   . Sleep apnea     CPAP Machine  . Obesity   . ED (erectile dysfunction)   . Sleep apnea   . COPD (chronic obstructive pulmonary disease)     Gold Stage  III/IV  . H/O polycythemia   . Chronic kidney disease (CKD), stage III  (moderate)   . DDD (degenerative disc disease), lumbar   . History of esophageal stricture   . Erectile dysfunction   . Prostate cancer   . Peptic ulcer disease   . History of colon polyps   . Gout   . Gastroparesis     Past Surgical History  Procedure Date  . Prostate surgery   . Coronary artery bypass graft     x 4  . Bone marrow biopsy   . Cardiac catheterization 05/02/2008  . Back surgery     lumbar    Family History  Problem Relation Age of Onset  . Colon cancer Neg Hx   . Cancer Brother     unsure what kind  . Heart disease Mother   . Heart disease Father   . Emphysema Father     History  Substance Use Topics  . Smoking status: Current Every Day Smoker -- 1.0 packs/day for 60 years    Types: Cigarettes  . Smokeless tobacco: Never Used  . Alcohol Use: No      Review of Systems  Constitutional: Negative for fever.  HENT: Negative for sore throat and rhinorrhea.   Eyes: Negative for redness.  Respiratory: Positive for shortness of breath. Negative for cough.   Cardiovascular: Positive for leg swelling. Negative for chest pain and palpitations.  Gastrointestinal: Negative for nausea, vomiting, abdominal pain and diarrhea.  Genitourinary: Negative for dysuria.  Musculoskeletal: Negative for myalgias.  Skin: Negative for rash.  Neurological:  Negative for headaches.    Allergies  Review of patient's allergies indicates no known allergies.  Home Medications   Current Outpatient Rx  Name  Route  Sig  Dispense  Refill  . ACETAMINOPHEN ER 650 MG PO TBCR   Oral   Take 650 mg by mouth every 8 (eight) hours as needed. Per bottle as needed for pain         . ALBUTEROL SULFATE (2.5 MG/3ML) 0.083% IN NEBU   Nebulization   Take 6 mLs (5 mg total) by nebulization every 4 (four) hours as needed. Dx 496   540 mL   0     496   . BISOPROLOL FUMARATE 5 MG PO TABS   Oral   Take 1 tablet (5 mg total) by mouth 2 (two) times daily.   60 tablet   4   .  CALCITRIOL 0.25 MCG PO CAPS   Oral   Take 1 capsule (0.25 mcg total) by mouth daily.   30 capsule   4   . FUROSEMIDE 40 MG PO TABS   Oral   Take 80-120 mg by mouth 2 (two) times daily. Take 3 tablets every morning. Take 2 tablets every  Night.         . INSULIN GLARGINE 100 UNIT/ML [place] SOLN               . INSULIN GLARGINE 100 UNIT/ML [place] SOLN   Subcutaneous   Inject 10 Units into the skin 2 (two) times daily.         . INSULIN PEN NEEDLE 31G X 5 MM MISC      Use as directed   100 each   prn   . INSULIN PEN NEEDLE 31G X 5 MM MISC   Does not apply   31 g by Does not apply route once. Use as directed   100 each   1   . IPRATROPIUM BROMIDE 0.02 % IN SOLN   Nebulization   Take 2.5 mLs (0.5 mg total) by nebulization 4 (four) times daily. DX: 496   300 mL   0     496   . PANTOPRAZOLE SODIUM 40 MG PO TBEC   Oral   Take 1 tablet (40 mg total) by mouth daily.   30 tablet   11   . SIMVASTATIN 40 MG PO TABS   Oral   Take 1 tablet (40 mg total) by mouth at bedtime. Once daily   30 tablet   11   . ZEMPLAR 2 MCG PO CAPS      TAKE ONE CAPSULE BY MOUTH EVERY OTHER DAY   15 capsule   8     BP 147/61  Pulse 74  Temp 98 F (36.7 C) (Oral)  Resp 20  SpO2 90%  Physical Exam  Nursing note and vitals reviewed. Constitutional: He appears well-developed and well-nourished.  HENT:  Head: Normocephalic and atraumatic.  Eyes: Conjunctivae normal are normal. Right eye exhibits no discharge. Left eye exhibits no discharge.  Neck: Normal range of motion. Neck supple.  Cardiovascular: Normal rate, regular rhythm and normal heart sounds.   No murmur heard. Pulmonary/Chest: Effort normal. No respiratory distress. He has no wheezes. He has no rales.       Distant breath sounds in all fields  Abdominal: Soft. There is no tenderness.  Musculoskeletal:       2+ pitting edema bilaterally with flaking of skin to mid ankle. There is some mild venous stasis cellulitis.  Neurological: He is alert.  Skin: Skin is warm and dry.  Psychiatric: He has a normal mood and affect.    ED Course  Procedures (including critical care time)  Labs Reviewed  CBC WITH DIFFERENTIAL - Abnormal; Notable for the following:    Hemoglobin 12.5 (*)     MCHC 28.4 (*)     All other components within normal limits  COMPREHENSIVE METABOLIC PANEL - Abnormal; Notable for the following:    Chloride 92 (*)     CO2 >45 (*)     Glucose, Bld 57 (*)     Creatinine, Ser 1.77 (*)     Albumin 3.1 (*)     GFR calc non Af Amer 36 (*)     GFR calc Af Amer 42 (*)     All other components within normal limits  GLUCOSE, CAPILLARY  LAB REPORT - SCANNED   Dg Chest 2 View  02/16/2012  *RADIOLOGY REPORT*  Clinical Data: Cough and short of breath  CHEST - 2 VIEW  Comparison: 12/18/2011  Findings: COPD with pulmonary hyperinflation.  Negative for pneumonia.  Negative for heart failure or effusion.  IMPRESSION: COPD without acute radiographic abnormality.   Original Report Authenticated By: Janeece Riggers, M.D.      1. Bilateral lower extremity edema     6:22 PM Patient seen and examined. Work-up initiated.    Vital signs reviewed and are as follows: Filed Vitals:   02/16/12 1623  BP: 147/61  Pulse: 74  Temp: 98 F (36.7 C)  Resp: 20   Patient d/w Dr. Rubin Payor who will see.   Handoff to [employer] who will follow on labs. Suspect patient will be able to go home given lack of complaints. CXR shows hyperinflation, no edema or infection. Labs show pt CO2 retainer (c/w history) and low CBG -- patient is awake and alert, will feed.      MDM  COPD -- patient here due worsening LE edema and concern of daughter. Pending completion of work-up. Patient is at baseline on 5L [place]. No failure on CXR. Do not feel LE edema 2/2 failure, renal failure, liver failure. Patient has no acute complaints.         [place], [place] 02/17/12 [phone]

## 2012-02-16 NOTE — ED Provider Notes (Signed)
[name] [name] S 8:30 PM patient discussed in sign out with Rhea Bleacher PA-C.  Patient sitting was increased bilateral lower extremity edema. Patient without any secondary complaints. History of COPD on 5 L O2 nasal cannula at home. No respiratory symptoms. Chest x-ray without signs of CHF. CMP pending. If tests normal plan for patient to return home with continued outpatient followup.   CMP results slight hypoglycemia otherwise unremarkable. Patient given orange juice and snack to eat. Will recheck CBG prior to discharge home. Patient continues to be asymptomatic at this time. Resting comfortably at home with stable O2 sats.        Date: 02/16/2012  Rate: 73  Rhythm: normal sinus rhythm  QRS Axis: normal  Intervals: normal  ST/T Wave abnormalities: nonspecific ST/T changes  Conduction Disutrbances:none  Narrative Interpretation:   Old EKG Reviewed: unchanged    Angus Seller, PA 02/16/12 2052

## 2012-02-16 NOTE — ED Notes (Signed)
PER EMS- pt picked up from home with c/o SOB x3 weeks. Daughter returned home from [place] and noticed pt feet were swollen and pt was unable to ambulate.  Was instructed by pcp to come here.  Pt lives alone.  Hx of COPD.  Usually on 2L o2 at home.  Bilateral wheezing and diminished lung sounds.  5mg  of albuterol given en route.  Reports that nebulizer at home broke, pt has been drinking solution.

## 2012-02-16 NOTE — ED Provider Notes (Signed)
Medical screening examination/treatment/procedure(s) were performed by non-physician practitioner and as supervising physician I was immediately available for consultation/collaboration.  Yuvonne Lanahan R. Camar Guyton, MD 02/16/12 2325 

## 2012-02-16 NOTE — ED Notes (Signed)
AOZ:[id number]<VH> Expected date:<BR> Expected time: 4:12 PM<BR> Means of arrival:Ambulance<BR> Comments:<BR> 71yom- SOB, edema, COPD

## 2012-02-16 NOTE — Telephone Encounter (Signed)
FYI:  Patient's daughter called, patient had appointment here today, she is unable to get him here. Per daughter, patient's legs are swollen up to knees and he is unable to walk, legs look really bad, patient is diabetic and and has not been taking his medications regularly. He is having trouble breathing, she is concerned. Daughter is in from [place], patient lives alone and she does not feel he can take care of himself. Daughter states that patient needs to be seen, advised her that if she was not able to get him to the office and he was having swelling in his legs and trouble breathing that she should take him to ER. Daughter states she will have to call EMS because she can not get him to hospital

## 2012-02-16 NOTE — ED Notes (Signed)
Gave pt a sandwich and 2 Orange juices per order

## 2012-02-17 ENCOUNTER — Telehealth: Payer: Self-pay

## 2012-02-17 NOTE — Progress Notes (Signed)
  Subjective:    Patient ID: [name] [name], male    DOB: November 11, 1940, 71 y.o.   MRN: [id number]  HPI    Review of Systems     Objective:   Physical Exam        Assessment & Plan:  Patient not seen

## 2012-02-17 NOTE — ED Provider Notes (Signed)
Medical screening examination/treatment/procedure(s) were performed by non-physician practitioner and as supervising physician I was immediately available for consultation/collaboration.  Juliet Rude. Rubin Payor, MD 02/17/12 [phone]

## 2012-02-17 NOTE — Telephone Encounter (Signed)
FAXED PT DEMO,INS AND ED VISIT ALONG WITH DR.LALONDE WOULD LIKE A HOME HEALTH EVAL DONE ON PT AS SOON AS POSSIBLE AND TO LET ME KNOW WHEN THEY CAN SEE HIM

## 2012-03-03 ENCOUNTER — Other Ambulatory Visit: Payer: Self-pay | Admitting: Family Medicine

## 2012-03-10 ENCOUNTER — Other Ambulatory Visit: Payer: Self-pay | Admitting: Family Medicine

## 2012-03-28 ENCOUNTER — Other Ambulatory Visit: Payer: Self-pay | Admitting: Family Medicine

## 2012-04-03 ENCOUNTER — Other Ambulatory Visit: Payer: Self-pay | Admitting: Family Medicine

## 2012-04-03 NOTE — Telephone Encounter (Signed)
Dominga Ferry from [facility] [phone] called and states she has left pt 3 messages and has not recd a call back.  I asked [name] what their policy was for advising [place] if they are having difficulty reaching the pt.  [name] then said after her 3rd try she would notify us and it was getting ready to be her 3rd try she said.  I advised that the pt needed someone back in December and it is now February.  6 weeks later on her part and on our part is not satifactory.   [name] has appt with pt on Feb 6th at 2:00 p.m. And she will notify us when complete.  Called Lynden [place] back to confirm above info and to tell her to get in touch with whomever he gets his oxygen from to get a bigger tank for pt.

## 2012-04-03 NOTE — Telephone Encounter (Signed)
[name] [name] is sister to pt.  She called and had [name] get on the phone.  [name] said it was ok to talk with [name].  [name] said pt needs to come into be seen.  His o2 tank is only a 3 ltr and he needs 5 ltr to get to doctor office.  [name] states no one from [facility] has been out.    I called [facility] today [phone] and spoke with Darl Householder, she states the case manager is [employer].  She will have [name] call me back asap.

## 2012-04-25 ENCOUNTER — Ambulatory Visit (INDEPENDENT_AMBULATORY_CARE_PROVIDER_SITE_OTHER): Payer: Medicare Other | Admitting: Family Medicine

## 2012-04-25 ENCOUNTER — Encounter: Payer: Self-pay | Admitting: Family Medicine

## 2012-04-25 ENCOUNTER — Other Ambulatory Visit: Payer: Self-pay

## 2012-04-25 VITALS — BP 120/60 | HR 90 | Wt 246.0 lb

## 2012-04-25 DIAGNOSIS — E669 Obesity, unspecified: Secondary | ICD-10-CM

## 2012-04-25 DIAGNOSIS — J449 Chronic obstructive pulmonary disease, unspecified: Secondary | ICD-10-CM

## 2012-04-25 DIAGNOSIS — E119 Type 2 diabetes mellitus without complications: Secondary | ICD-10-CM

## 2012-04-25 DIAGNOSIS — J9610 Chronic respiratory failure, unspecified whether with hypoxia or hypercapnia: Secondary | ICD-10-CM

## 2012-04-25 DIAGNOSIS — I10 Essential (primary) hypertension: Secondary | ICD-10-CM

## 2012-04-25 LAB — COMPREHENSIVE METABOLIC PANEL
ALT: 11 U/L (ref 0–53)
AST: 14 U/L (ref 0–37)
CO2: 51 mEq/L — ABNORMAL HIGH (ref 19–32)
Sodium: 144 mEq/L (ref 135–145)
Total Bilirubin: 0.6 mg/dL (ref 0.3–1.2)
Total Protein: 6.7 g/dL (ref 6.0–8.3)

## 2012-04-25 LAB — POCT GLYCOSYLATED HEMOGLOBIN (HGB A1C): Hemoglobin A1C: 7.5

## 2012-04-25 MED ORDER — ALBUTEROL SULFATE (2.5 MG/3ML) 0.083% IN NEBU: 5.0000 mg | INHALATION_SOLUTION | RESPIRATORY_TRACT | Status: DC | PRN

## 2012-04-25 MED ORDER — IPRATROPIUM BROMIDE 0.02 % IN SOLN: 0.5000 mg | Freq: Four times a day (QID) | RESPIRATORY_TRACT | Status: DC

## 2012-04-25 MED ORDER — LANCETS MISC: 1.0000 | Freq: Three times a day (TID) | Status: DC

## 2012-04-25 MED ORDER — GLUCOSE BLOOD VI STRP: 1.0000 | ORAL_STRIP | Freq: Three times a day (TID) | Status: DC

## 2012-04-25 NOTE — Telephone Encounter (Signed)
RENE FROM [facility] CALLED AND SAID PT USE SOLO V 2 AND HE NEEDS STRIPS AND LANCETS

## 2012-04-25 NOTE — Progress Notes (Signed)
  Subjective:    Patient ID: [name] [name], male    DOB: 06/28/1940, 72 y.o.   MRN: [id number]  HPI He is here for an interval evaluation. He is now being monitored and helped at home by [facility]. He has not been using his inhalers correctly. He was apparently swallowing the medication rather than using it in the nebulizer. Presently he is also not been using Atrovent. They're in the process of getting him into an assisted living. He has not been eating with any amount of regularity. He has been unable to care for himself at home including washing.he does need test strips and does say that his blood sugars are running in the 200 range.   Review of Systems     Objective:   Physical Exam  alert and in no distress. Hemoglobin A1c is 7.5.exam of his feet does show extensive thickening and scaling.       Assessment & Plan:  DIABETES MELLITUS, TYPE II - Plan: POCT glycosylated hemoglobin (Hb A1C)  COPD - Plan: ipratropium (ATROVENT) 0.02 % nebulizer solution, albuterol (PROVENTIL) (2.5 MG/3ML) 0.083% nebulizer solution  Chronic respiratory failure - Plan: ipratropium (ATROVENT) 0.02 % nebulizer solution, albuterol (PROVENTIL) (2.5 MG/3ML) 0.083% nebulizer solution  Encounter for long-term (current) use of other medications - Plan: Comprehensive metabolic panel  OBESITY  HYPERTENSION  Onychomycosis strongly encouraged him to go to the nursing home. Some of his medications were renewed. He will also see the podiatrist to get his nails trimmed.

## 2012-04-26 NOTE — Progress Notes (Signed)
Quick Note:  CALLED PT HOME LEFT MESSAGE LEFT WORD FOR WORD MESSAGEHis electrolytes are abnormal. This is probably because of his breathing. Make sure that he is using the nebulizer appropriately. Call the [facility] nurse so she can monitor him using the nebulizer appropriately. I want him back in one or 2 weeks to reevaluate these numbers.CALLED RENE [phone] TO LET HER KNOW OF LABS AND NEB USE ______

## 2012-04-26 NOTE — Progress Notes (Signed)
Quick Note:  His electrolytes are abnormal. This is probably because of his breathing. Make sure that he is using the nebulizer appropriately. Call the [facility] nurse so she can monitor him using the nebulizer appropriately. I want him back in one or 2 weeks to reevaluate these numbers. ______

## 2012-05-06 ENCOUNTER — Other Ambulatory Visit: Payer: Self-pay | Admitting: Family Medicine

## 2012-05-08 NOTE — Telephone Encounter (Signed)
Is this okay to refill? 

## 2012-05-09 ENCOUNTER — Other Ambulatory Visit: Payer: Self-pay | Admitting: Family Medicine

## 2012-05-12 ENCOUNTER — Emergency Department (HOSPITAL_COMMUNITY): Payer: Medicare Other

## 2012-05-12 ENCOUNTER — Inpatient Hospital Stay (HOSPITAL_COMMUNITY)
Admission: EM | Admit: 2012-05-12 | Discharge: 2012-05-16 | DRG: 190 | Disposition: A | Discharge status: Skilled Nursing Facility | Payer: Medicare Other | Attending: Internal Medicine | Admitting: Internal Medicine

## 2012-05-12 ENCOUNTER — Encounter (HOSPITAL_COMMUNITY): Payer: Self-pay | Admitting: *Deleted

## 2012-05-12 DIAGNOSIS — J9610 Chronic respiratory failure, unspecified whether with hypoxia or hypercapnia: Secondary | ICD-10-CM

## 2012-05-12 DIAGNOSIS — Z79899 Other long term (current) drug therapy: Secondary | ICD-10-CM

## 2012-05-12 DIAGNOSIS — Z951 Presence of aortocoronary bypass graft: Secondary | ICD-10-CM

## 2012-05-12 DIAGNOSIS — I2781 Cor pulmonale (chronic): Secondary | ICD-10-CM

## 2012-05-12 DIAGNOSIS — Z6831 Body mass index (BMI) 31.0-31.9, adult: Secondary | ICD-10-CM

## 2012-05-12 DIAGNOSIS — E872 Acidosis: Secondary | ICD-10-CM

## 2012-05-12 DIAGNOSIS — E662 Morbid (severe) obesity with alveolar hypoventilation: Secondary | ICD-10-CM

## 2012-05-12 DIAGNOSIS — D72829 Elevated white blood cell count, unspecified: Secondary | ICD-10-CM | POA: Diagnosis present

## 2012-05-12 DIAGNOSIS — F3289 Other specified depressive episodes: Secondary | ICD-10-CM | POA: Diagnosis present

## 2012-05-12 DIAGNOSIS — G4733 Obstructive sleep apnea (adult) (pediatric): Secondary | ICD-10-CM | POA: Diagnosis present

## 2012-05-12 DIAGNOSIS — D696 Thrombocytopenia, unspecified: Secondary | ICD-10-CM | POA: Diagnosis present

## 2012-05-12 DIAGNOSIS — J9620 Acute and chronic respiratory failure, unspecified whether with hypoxia or hypercapnia: Secondary | ICD-10-CM | POA: Diagnosis present

## 2012-05-12 DIAGNOSIS — N183 Chronic kidney disease, stage 3 unspecified: Secondary | ICD-10-CM | POA: Diagnosis present

## 2012-05-12 DIAGNOSIS — J449 Chronic obstructive pulmonary disease, unspecified: Secondary | ICD-10-CM

## 2012-05-12 DIAGNOSIS — R4182 Altered mental status, unspecified: Secondary | ICD-10-CM

## 2012-05-12 DIAGNOSIS — N184 Chronic kidney disease, stage 4 (severe): Secondary | ICD-10-CM | POA: Diagnosis present

## 2012-05-12 DIAGNOSIS — Z9981 Dependence on supplemental oxygen: Secondary | ICD-10-CM

## 2012-05-12 DIAGNOSIS — Z794 Long term (current) use of insulin: Secondary | ICD-10-CM

## 2012-05-12 DIAGNOSIS — IMO0002 Reserved for concepts with insufficient information to code with codable children: Secondary | ICD-10-CM | POA: Diagnosis present

## 2012-05-12 DIAGNOSIS — R0902 Hypoxemia: Secondary | ICD-10-CM

## 2012-05-12 DIAGNOSIS — J441 Chronic obstructive pulmonary disease with (acute) exacerbation: Secondary | ICD-10-CM

## 2012-05-12 DIAGNOSIS — N189 Chronic kidney disease, unspecified: Secondary | ICD-10-CM

## 2012-05-12 DIAGNOSIS — I509 Heart failure, unspecified: Secondary | ICD-10-CM | POA: Diagnosis present

## 2012-05-12 DIAGNOSIS — I10 Essential (primary) hypertension: Secondary | ICD-10-CM

## 2012-05-12 DIAGNOSIS — F0390 Unspecified dementia without behavioral disturbance: Secondary | ICD-10-CM | POA: Diagnosis present

## 2012-05-12 DIAGNOSIS — F329 Major depressive disorder, single episode, unspecified: Secondary | ICD-10-CM | POA: Diagnosis present

## 2012-05-12 DIAGNOSIS — G9340 Encephalopathy, unspecified: Secondary | ICD-10-CM

## 2012-05-12 DIAGNOSIS — I5032 Chronic diastolic (congestive) heart failure: Secondary | ICD-10-CM | POA: Diagnosis present

## 2012-05-12 DIAGNOSIS — G40909 Epilepsy, unspecified, not intractable, without status epilepticus: Secondary | ICD-10-CM | POA: Diagnosis present

## 2012-05-12 DIAGNOSIS — E119 Type 2 diabetes mellitus without complications: Secondary | ICD-10-CM | POA: Diagnosis present

## 2012-05-12 DIAGNOSIS — M109 Gout, unspecified: Secondary | ICD-10-CM | POA: Diagnosis present

## 2012-05-12 DIAGNOSIS — G9341 Metabolic encephalopathy: Secondary | ICD-10-CM | POA: Diagnosis present

## 2012-05-12 DIAGNOSIS — K219 Gastro-esophageal reflux disease without esophagitis: Secondary | ICD-10-CM | POA: Diagnosis present

## 2012-05-12 DIAGNOSIS — E785 Hyperlipidemia, unspecified: Secondary | ICD-10-CM | POA: Diagnosis present

## 2012-05-12 DIAGNOSIS — I129 Hypertensive chronic kidney disease with stage 1 through stage 4 chronic kidney disease, or unspecified chronic kidney disease: Secondary | ICD-10-CM | POA: Diagnosis present

## 2012-05-12 DIAGNOSIS — F17200 Nicotine dependence, unspecified, uncomplicated: Secondary | ICD-10-CM | POA: Diagnosis present

## 2012-05-12 LAB — COMPREHENSIVE METABOLIC PANEL
Albumin: 3.4 g/dL — ABNORMAL LOW (ref 3.5–5.2)
BUN: 23 mg/dL (ref 6–23)
Creatinine, Ser: 1.68 mg/dL — ABNORMAL HIGH (ref 0.50–1.35)
GFR calc Af Amer: 45 mL/min — ABNORMAL LOW (ref >90)
Glucose, Bld: 145 mg/dL — ABNORMAL HIGH (ref 70–99)
Total Protein: 7.5 g/dL (ref 6.0–8.3)

## 2012-05-12 LAB — CBC WITH DIFFERENTIAL/PLATELET
Basophils Absolute: 0 10*3/uL (ref 0.0–0.1)
Eosinophils Absolute: 0 10*3/uL (ref 0.0–0.7)
Lymphs Abs: 2.3 10*3/uL (ref 0.7–4.0)
MCH: 28.4 pg (ref 26.0–34.0)
MCHC: 28.1 g/dL — ABNORMAL LOW (ref 30.0–36.0)
MCV: 101 fL — ABNORMAL HIGH (ref 78.0–100.0)
Monocytes Absolute: 1.3 10*3/uL — ABNORMAL HIGH (ref 0.1–1.0)
Monocytes Relative: 9 % (ref 3–12)
Platelets: 33 10*3/uL — ABNORMAL LOW (ref 150–400)
RDW: 14 % (ref 11.5–15.5)
WBC: 14.1 10*3/uL — ABNORMAL HIGH (ref 4.0–10.5)

## 2012-05-12 LAB — BLOOD GAS, ARTERIAL
Drawn by: 308601
Inspiratory PAP: 20
Mode: POSITIVE
pCO2 arterial: 105 mmHg (ref 35.0–45.0)
pH, Arterial: 7.296 — ABNORMAL LOW (ref 7.350–7.450)

## 2012-05-12 LAB — RAPID URINE DRUG SCREEN, HOSP PERFORMED
Barbiturates: NOT DETECTED
Tetrahydrocannabinol: NOT DETECTED

## 2012-05-12 LAB — PRO B NATRIURETIC PEPTIDE: Pro B Natriuretic peptide (BNP): 1198 pg/mL — ABNORMAL HIGH (ref 0–125)

## 2012-05-12 LAB — TROPONIN I: Troponin I: <0.3 ng/mL (ref <0.30)

## 2012-05-12 MED ORDER — SALINE SPRAY 0.65 % NA SOLN
2.0000 | NASAL | Status: DC | PRN
  Filled 2012-05-12: qty 44

## 2012-05-12 MED ORDER — ALBUTEROL (5 MG/ML) CONTINUOUS INHALATION SOLN
INHALATION_SOLUTION | RESPIRATORY_TRACT | Status: AC
  Filled 2012-05-12: qty 20

## 2012-05-12 MED ORDER — ALBUTEROL SULFATE (5 MG/ML) 0.5% IN NEBU
10.0000 mg | INHALATION_SOLUTION | Freq: Once | RESPIRATORY_TRACT | Status: AC
  Administered 2012-05-12: 10 mg via RESPIRATORY_TRACT

## 2012-05-12 MED ORDER — SODIUM CHLORIDE 0.9 % IV SOLN: 250.0000 mL | INTRAVENOUS | Status: DC | PRN

## 2012-05-12 MED ORDER — DEXTROSE 5 % IV SOLN
1.0000 g | INTRAVENOUS | Status: DC
  Administered 2012-05-12: 1 g via INTRAVENOUS
  Filled 2012-05-12 (×2): qty 10

## 2012-05-12 MED ORDER — ALBUTEROL SULFATE (5 MG/ML) 0.5% IN NEBU
2.5000 mg | INHALATION_SOLUTION | Freq: Four times a day (QID) | RESPIRATORY_TRACT | Status: DC
  Administered 2012-05-12 – 2012-05-16 (×15): 2.5 mg via RESPIRATORY_TRACT
  Filled 2012-05-12 (×14): qty 0.5

## 2012-05-12 MED ORDER — INSULIN ASPART 100 UNIT/ML ~~LOC~~ SOLN
15.0000 [IU] | Freq: Three times a day (TID) | SUBCUTANEOUS | Status: DC
  Administered 2012-05-12: 15 [IU] via SUBCUTANEOUS

## 2012-05-12 MED ORDER — SODIUM CHLORIDE 0.9 % IJ SOLN
3.0000 mL | Freq: Two times a day (BID) | INTRAMUSCULAR | Status: DC
  Administered 2012-05-12: 3 mL via INTRAVENOUS
  Administered 2012-05-13: 11:00:00 via INTRAVENOUS
  Administered 2012-05-13 – 2012-05-16 (×6): 3 mL via INTRAVENOUS

## 2012-05-12 MED ORDER — PARICALCITOL 1 MCG PO CAPS
2.0000 ug | ORAL_CAPSULE | ORAL | Status: DC
  Administered 2012-05-13 – 2012-05-15 (×2): 2 ug via ORAL
  Filled 2012-05-12 (×3): qty 2

## 2012-05-12 MED ORDER — FUROSEMIDE 80 MG PO TABS: 80.0000 mg | ORAL_TABLET | Freq: Two times a day (BID) | ORAL | Status: DC

## 2012-05-12 MED ORDER — METHYLPREDNISOLONE SODIUM SUCC 125 MG IJ SOLR
125.0000 mg | Freq: Once | INTRAMUSCULAR | Status: AC
  Administered 2012-05-12: 125 mg via INTRAVENOUS
  Filled 2012-05-12: qty 2

## 2012-05-12 MED ORDER — INSULIN GLULISINE 100 UNIT/ML ~~LOC~~ SOLN: 15.0000 [IU] | Freq: Three times a day (TID) | SUBCUTANEOUS | Status: DC

## 2012-05-12 MED ORDER — DEXTROSE 5 % IV SOLN
500.0000 mg | INTRAVENOUS | Status: DC
  Administered 2012-05-12: 500 mg via INTRAVENOUS
  Filled 2012-05-12 (×2): qty 500

## 2012-05-12 MED ORDER — ENOXAPARIN SODIUM 40 MG/0.4ML ~~LOC~~ SOLN: 40.0000 mg | SUBCUTANEOUS | Status: DC

## 2012-05-12 MED ORDER — DM-GUAIFENESIN ER 30-600 MG PO TB12
1.0000 | ORAL_TABLET | Freq: Two times a day (BID) | ORAL | Status: DC | PRN
  Filled 2012-05-12: qty 1

## 2012-05-12 MED ORDER — INSULIN ASPART 100 UNIT/ML ~~LOC~~ SOLN
0.0000 [IU] | Freq: Three times a day (TID) | SUBCUTANEOUS | Status: DC
  Administered 2012-05-12 – 2012-05-13 (×2): 3 [IU] via SUBCUTANEOUS
  Administered 2012-05-13 – 2012-05-14 (×3): 2 [IU] via SUBCUTANEOUS
  Administered 2012-05-14: 5 [IU] via SUBCUTANEOUS
  Administered 2012-05-14: 3 [IU] via SUBCUTANEOUS
  Administered 2012-05-15: 2 [IU] via SUBCUTANEOUS
  Administered 2012-05-15 – 2012-05-16 (×3): 3 [IU] via SUBCUTANEOUS
  Administered 2012-05-16: 2 [IU] via SUBCUTANEOUS

## 2012-05-12 MED ORDER — HEPARIN SODIUM (PORCINE) 5000 UNIT/ML IJ SOLN: 5000.0000 [IU] | Freq: Three times a day (TID) | INTRAMUSCULAR | Status: DC

## 2012-05-12 MED ORDER — IPRATROPIUM BROMIDE 0.02 % IN SOLN
1.0000 mg | Freq: Once | RESPIRATORY_TRACT | Status: AC
  Administered 2012-05-12: 1 mg via RESPIRATORY_TRACT
  Filled 2012-05-12: qty 5

## 2012-05-12 MED ORDER — IPRATROPIUM BROMIDE 0.02 % IN SOLN
0.5000 mg | Freq: Four times a day (QID) | RESPIRATORY_TRACT | Status: DC
  Administered 2012-05-12 – 2012-05-16 (×15): 0.5 mg via RESPIRATORY_TRACT
  Filled 2012-05-12 (×15): qty 2.5

## 2012-05-12 MED ORDER — ALBUTEROL SULFATE (5 MG/ML) 0.5% IN NEBU
2.5000 mg | INHALATION_SOLUTION | RESPIRATORY_TRACT | Status: DC | PRN
Start: 2012-05-12 — End: 2012-05-16
  Filled 2012-05-12: qty 0.5

## 2012-05-12 MED ORDER — METHYLPREDNISOLONE SODIUM SUCC 125 MG IJ SOLR
60.0000 mg | INTRAMUSCULAR | Status: DC
  Administered 2012-05-12 – 2012-05-13 (×2): via INTRAVENOUS
  Administered 2012-05-13: 60 mg via INTRAVENOUS
  Filled 2012-05-12 (×11): qty 0.96

## 2012-05-12 MED ORDER — PANTOPRAZOLE SODIUM 40 MG PO TBEC
40.0000 mg | DELAYED_RELEASE_TABLET | Freq: Every day | ORAL | Status: DC
  Administered 2012-05-13 – 2012-05-16 (×4): 40 mg via ORAL
  Filled 2012-05-12 (×4): qty 1

## 2012-05-12 MED ORDER — FUROSEMIDE 80 MG PO TABS
120.0000 mg | ORAL_TABLET | Freq: Every day | ORAL | Status: DC
  Administered 2012-05-13 – 2012-05-14 (×2): 120 mg via ORAL
  Filled 2012-05-12 (×3): qty 1

## 2012-05-12 MED ORDER — BISOPROLOL FUMARATE 5 MG PO TABS
5.0000 mg | ORAL_TABLET | Freq: Two times a day (BID) | ORAL | Status: DC
  Filled 2012-05-12: qty 1

## 2012-05-12 MED ORDER — VITAMINS A & D EX OINT
TOPICAL_OINTMENT | CUTANEOUS | Status: AC
  Administered 2012-05-12: 18:00:00
  Filled 2012-05-12: qty 20

## 2012-05-12 MED ORDER — SIMVASTATIN 40 MG PO TABS
40.0000 mg | ORAL_TABLET | ORAL | Status: DC
  Administered 2012-05-14: 40 mg via ORAL
  Filled 2012-05-12 (×3): qty 1

## 2012-05-12 MED ORDER — INSULIN GLARGINE 100 UNIT/ML ~~LOC~~ SOLN
10.0000 [IU] | Freq: Two times a day (BID) | SUBCUTANEOUS | Status: DC
  Administered 2012-05-13 – 2012-05-16 (×8): 10 [IU] via SUBCUTANEOUS
  Filled 2012-05-12: qty 0.1

## 2012-05-12 MED ORDER — SODIUM CHLORIDE 0.9 % IJ SOLN: 3.0000 mL | INTRAMUSCULAR | Status: DC | PRN

## 2012-05-12 MED ORDER — INSULIN ASPART 100 UNIT/ML ~~LOC~~ SOLN: 0.0000 [IU] | Freq: Every day | SUBCUTANEOUS | Status: DC

## 2012-05-12 MED ORDER — FUROSEMIDE 80 MG PO TABS
80.0000 mg | ORAL_TABLET | Freq: Every day | ORAL | Status: DC
  Administered 2012-05-12 – 2012-05-13 (×2): 80 mg via ORAL
  Filled 2012-05-12 (×4): qty 1

## 2012-05-12 MED ORDER — CALCITRIOL 0.25 MCG PO CAPS
0.2500 ug | ORAL_CAPSULE | Freq: Every day | ORAL | Status: DC
  Administered 2012-05-13 – 2012-05-16 (×4): 0.25 ug via ORAL
  Filled 2012-05-12 (×4): qty 1

## 2012-05-12 NOTE — H&P (Signed)
[facility] History and Physical  SHAUNAK KREIS ZOX:[id number] DOB: 07/05/1940 DOA: 05/12/2012  Referring physician: er PCP: Carollee Herter, MD  Specialists:   Chief Complaint: hypoxia/AMS  HPI: [name] [name] is a 72 y.o. male  Who lives home alone.  He is currently altered and unable to provide much history. So most was obtained from the record.  He does states he lives home alone, does not cook- eats sandwiches when he does eat.  Still smoke 1 pp week of cigarettes.  Is on 2L of home O2.   Family called EMS as patient was acting confused and abnormal at home, apparently there are reports, he climbed on top of a dresser.  When EMS arrived, his O2 sat was 77% on RA- it is reported that he wears 5 L of O2.  In the ER, his CO2 on BMP was > 45- ABG was done and CO2 was so high it would not record.  I ordered Bipap.      Patient is confused some- says he would like 5 babies with me.  But knows where he is and the date.  He told the nurse he was here to get his toenails trimmed.  Family also reports patient is falling at home    Review of Systems: unable to do as patient is altered    Past Medical History  Diagnosis Date  . Renal insufficiency   . Diabetes mellitus   . GERD (gastroesophageal reflux disease)   . Gastroparesis   . Esophageal stricture   . Hyperlipemia   . Hypertension   . Pancreatitis   . Chronic respiratory disease   . Depression   . Seizure disorder   . Arthritis   . Diverticulosis   . Sleep apnea     CPAP Machine  . Obesity   . ED (erectile dysfunction)   . Sleep apnea   . COPD (chronic obstructive pulmonary disease)     Gold Stage  III/IV  . H/O polycythemia   . Chronic kidney disease (CKD), stage III (moderate)   . DDD (degenerative disc disease), lumbar   . History of esophageal stricture   . Erectile dysfunction   . Prostate cancer   . Peptic ulcer disease   . History of colon polyps   . Gout   . Gastroparesis    Past Surgical  History  Procedure Laterality Date  . Prostate surgery    . Coronary artery bypass graft      x 4  . Bone marrow biopsy    . Cardiac catheterization  05/02/2008  . Back surgery      lumbar   Social History:  reports that he has been smoking Cigarettes.  He has a 60 pack-year smoking history. He has never used smokeless tobacco. He reports that he does not drink alcohol or use illicit drugs.  No Known Allergies  Family History  Problem Relation Age of Onset  . Colon cancer Neg Hx   . Cancer Brother     unsure what kind  . Heart disease Mother   . Heart disease Father   . Emphysema Father     Prior to Admission medications   Medication Sig Start Date End Date Taking? Authorizing Provider  acetaminophen (TYLENOL) 500 MG tablet Take 500 mg by mouth every 6 (six) hours as needed for pain ("per bottle").   Yes Historical Provider, MD  albuterol (PROVENTIL) (2.5 MG/3ML) 0.083% nebulizer solution Take 5 mg by nebulization every 4 (four) hours as  needed for wheezing or shortness of breath. Dx 496 04/25/12  Yes Ronnald Nian, MD  bisoprolol (ZEBETA) 5 MG tablet Take 1 tablet (5 mg total) by mouth 2 (two) times daily. 12/24/11 12/23/12 Yes Ronnald Nian, MD  budesonide (PULMICORT) 0.25 MG/2ML nebulizer solution Take 0.25 mg by nebulization 2 (two) times daily.   Yes Historical Provider, MD  calcitRIOL (ROCALTROL) 0.25 MCG capsule Take 1 capsule (0.25 mcg total) by mouth daily. 12/24/11  Yes Ronnald Nian, MD  dextromethorphan-guaiFENesin [facility] DM) 30-600 MG per 12 hr tablet Take 1 tablet by mouth every 12 (twelve) hours as needed (cough/congestion).   Yes Historical Provider, MD  furosemide (LASIX) 40 MG tablet Take 80-120 mg by mouth 2 (two) times daily with a meal. Take 3 tablets every morning. Take 2 tablets every  Night.   Yes Historical Provider, MD  insulin glargine (LANTUS) 100 UNIT/ML injection Inject 10 Units into the skin 2 (two) times daily. 12/28/10  Yes Ronnald Nian, MD   insulin glulisine (APIDRA) 100 UNIT/ML injection Inject 15 Units into the skin 3 (three) times daily before meals.   Yes Historical Provider, MD  ipratropium (ATROVENT) 0.02 % nebulizer solution Take 2.5 mLs (0.5 mg total) by nebulization 4 (four) times daily. DX: 496 04/25/12 04/25/13 Yes Ronnald Nian, MD  pantoprazole (PROTONIX) 40 MG tablet Take 40 mg by mouth daily.   Yes Historical Provider, MD  paricalcitol (ZEMPLAR) 2 MCG capsule Take 2 mcg by mouth every other day.   Yes Historical Provider, MD  simvastatin (ZOCOR) 40 MG tablet Take 40 mg by mouth every other day. Takes at bedtime.   Yes Historical Provider, MD  sodium chloride (OCEAN) 0.65 % SOLN nasal spray Place 2 sprays into the nose every 4 (four) hours as needed for congestion.   Yes Historical Provider, MD  glucose blood test strip 1 each by Other route 3 (three) times daily. Use as instructed ( This is for the SOLO V 2) 04/25/12   Ronnald Nian, MD  Insulin Pen Needle 31G X 5 MM MISC Use as directed 05/07/11   Ronnald Nian, MD  Insulin Pen Needle 31G X 5 MM MISC 31 g by Does not apply route once. Use as directed 12/24/11   Ronnald Nian, MD  Lancets MISC 1 each by Does not apply route 3 (three) times daily. USE AS DIRECTED ( THIS IS FOR SOLO V 2 ) 04/25/12   Ronnald Nian, MD   Physical Exam: Filed Vitals:   05/12/12 1227 05/12/12 1300 05/12/12 1305 05/12/12 1458  BP: 139/50     Pulse: 91   81  Temp:   98.9 F (37.2 C)   TempSrc: Oral  Oral   Resp: 22   23  SpO2: 99% 93%  100%     General:  Very disheveled, dirty socks, foul smelling  Eyes: wnl  ENT: moist, poor dentation  Neck: supple  Cardiovascular: rrr  Respiratory: decreased to almost no breath sounds, very tight  Abdomen: +Bs, obese, NT  Skin: very dry skin- legs with rough patched b/L, right leg > left leg in circumfrence  Musculoskeletal: moves all 4 extremities  Psychiatric: altered in speech- sometimes not appropriate  Neurologic: no focal  deficit  Labs on Admission:  Basic Metabolic Panel:  Recent Labs Lab 05/12/12 1300  NA 144  K 3.8  CL 89*  CO2 >45*  GLUCOSE 145*  BUN 23  CREATININE 1.68*  CALCIUM 10.1   Liver  Function Tests:  Recent Labs Lab 05/12/12 1300  AST 21  ALT 28  ALKPHOS 81  BILITOT 0.6  PROT 7.5  ALBUMIN 3.4*   No results found for this basename: LIPASE, AMYLASE,  in the last 168 hours No results found for this basename: AMMONIA,  in the last 168 hours CBC:  Recent Labs Lab 05/12/12 1300  WBC 14.1*  NEUTROABS 10.5*  HGB 11.5*  HCT 40.9  MCV 101.0*  PLT 33*   Cardiac Enzymes:  Recent Labs Lab 05/12/12 1300  TROPONINI <0.30    BNP (last 3 results)  Recent Labs  05/12/12 1300  PROBNP 1198.0*   CBG: No results found for this basename: GLUCAP,  in the last 168 hours  Radiological Exams on Admission: Dg Chest 2 View  05/12/2012  *RADIOLOGY REPORT*  Clinical Data: Shortness of breath, cough  CHEST - 2 VIEW  Comparison: 02/16/2012  Findings: Chronic interstitial markings.  Linear scarring in the right mid lung.  No focal consolidation. No pleural effusion or pneumothorax.  The heart is normal in size.  Degenerative changes of the visualized thoracolumbar spine.  IMPRESSION: No evidence of acute cardiopulmonary disease.   Original Report Authenticated By: Charline Bills, M.D.    Ct Head Wo Contrast  05/12/2012  *RADIOLOGY REPORT*  Clinical Data: Altered mental status, confusion, multiple falls  CT HEAD WITHOUT CONTRAST  Technique:  Contiguous axial images were obtained from the base of the skull through the vertex without contrast.  Comparison: None.  Findings: Motion degraded images.  No evidence of acute intracranial abnormality.  Intracranial atherosclerosis.  Cerebral volume is age appropriate.  No ventriculomegaly.  Partial opacification of the bilateral ethmoid sinuses.  The right mastoid air cells are partially opacified (series 3/image 8).  No evidence of calvarial  fracture.  IMPRESSION: Motion degraded images.  No evidence of acute intracranial abnormality.   Original Report Authenticated By: Charline Bills, M.D.     EKG: Independently reviewed. NSR  Assessment/Plan Principal Problem:   CO2 retention Active Problems:   DIABETES MELLITUS, TYPE II   Morbid obesity   TOBACCO USER   OSA (obstructive sleep apnea)   Chronic renal failure   Leukocytosis   Encephalopathy acute   Thrombocytopenia   1. Acute CO2 retention causing AMS- will place on bipap and in the SDU- may need critical care team if ABG does not improve with bipap, check ABG in a few hours, check UDS for possible drug abuse- follows with Dr. Sherene Sires as an outpatient 2. DM- SSI, continue home meds 3. Tobacco abuse- encourage cessation 4. COPD exacerbation- steroids, abx, nebs, O2 5. AMS/acute encephalopathy- due to CO retention but will check UDS as well 6. Thrombocytopenia- trend 7. Leukocytosis- abx and monitor 8. OSA- does not wear c-pap at home 9. Poor living conditions/falls- PT eval and social services consult (looks like family and PCP want him to go to SNF)    Code Status: full Family Communication: patient at bedside Disposition Plan: SDU for bipap  Time spent: 70 min  VANN, JESSICA [facility] Pager [phone]  If 7PM-7AM, please contact night-coverage [email or web address] Password [facility] 05/12/2012, 3:03 PM

## 2012-05-12 NOTE — ED Provider Notes (Signed)
History     CSN: [id number]  Arrival date & time 05/12/12  1206   First MD Initiated Contact with Patient 05/12/12 1221      Chief Complaint  Patient presents with  . Altered Mental Status  . Shortness of Breath    (Consider location/radiation/quality/duration/timing/severity/associated sxs/prior treatment) The history is provided by the patient and a relative.  [name] [name] is a 72 y.o. male history of COPD but still current smoker, diabetes, here presenting with shortness of breath and altered mental status. He lives at home and sister went to check on him today and found him confused. He was holding his oxygen his hand but is not using his oxygen. He usually is on 5 L nasal cannula at home. Per EMS he was hypoxic to the 70% on room air.  Patient states he felt fine and denies taking off his oxygen. He has been falling more often and he said he feels lightheaded and dizzy before he falls. Denies any syncope. His legs have been chronically swollen and he denies any history of heart failure or cardiac stents. He is still smoking.    Past Medical History  Diagnosis Date  . Renal insufficiency   . Diabetes mellitus   . GERD (gastroesophageal reflux disease)   . Gastroparesis   . Esophageal stricture   . Hyperlipemia   . Hypertension   . Pancreatitis   . Chronic respiratory disease   . Depression   . Seizure disorder   . Arthritis   . Diverticulosis   . Sleep apnea     CPAP Machine  . Obesity   . ED (erectile dysfunction)   . Sleep apnea   . COPD (chronic obstructive pulmonary disease)     Gold Stage  III/IV  . H/O polycythemia   . Chronic kidney disease (CKD), stage III (moderate)   . DDD (degenerative disc disease), lumbar   . History of esophageal stricture   . Erectile dysfunction   . Prostate cancer   . Peptic ulcer disease   . History of colon polyps   . Gout   . Gastroparesis     Past Surgical History  Procedure Laterality Date  . Prostate surgery    .  Coronary artery bypass graft      x 4  . Bone marrow biopsy    . Cardiac catheterization  05/02/2008  . Back surgery      lumbar    Family History  Problem Relation Age of Onset  . Colon cancer Neg Hx   . Cancer Brother     unsure what kind  . Heart disease Mother   . Heart disease Father   . Emphysema Father     History  Substance Use Topics  . Smoking status: Current Every Day Smoker -- 1.00 packs/day for 60 years    Types: Cigarettes  . Smokeless tobacco: Never Used  . Alcohol Use: No      Review of Systems  Respiratory: Positive for shortness of breath.   Neurological: Positive for weakness.  Psychiatric/Behavioral: Positive for altered mental status.  All other systems reviewed and are negative.    Allergies  Review of patient's allergies indicates no known allergies.  Home Medications   Current Outpatient Rx  Name  Route  Sig  Dispense  Refill  . acetaminophen (TYLENOL) 500 MG tablet   Oral   Take 500 mg by mouth every 6 (six) hours as needed for pain ("per bottle").         Marland Kitchen  albuterol (PROVENTIL) (2.5 MG/3ML) 0.083% nebulizer solution   Nebulization   Take 5 mg by nebulization every 4 (four) hours as needed for wheezing or shortness of breath. Dx 496         . bisoprolol (ZEBETA) 5 MG tablet   Oral   Take 1 tablet (5 mg total) by mouth 2 (two) times daily.   60 tablet   4   . budesonide (PULMICORT) 0.25 MG/2ML nebulizer solution   Nebulization   Take 0.25 mg by nebulization 2 (two) times daily.         . calcitRIOL (ROCALTROL) 0.25 MCG capsule   Oral   Take 1 capsule (0.25 mcg total) by mouth daily.   30 capsule   4   . dextromethorphan-guaiFENesin (MUCINEX DM) 30-600 MG per 12 hr tablet   Oral   Take 1 tablet by mouth every 12 (twelve) hours as needed (cough/congestion).         . furosemide (LASIX) 40 MG tablet   Oral   Take 80-120 mg by mouth 2 (two) times daily with a meal. Take 3 tablets every morning. Take 2 tablets every   Night.         . insulin glargine (LANTUS) 100 UNIT/ML injection   Subcutaneous   Inject 10 Units into the skin 2 (two) times daily.         . insulin glulisine (APIDRA) 100 UNIT/ML injection   Subcutaneous   Inject 15 Units into the skin 3 (three) times daily before meals.         Marland Kitchen ipratropium (ATROVENT) 0.02 % nebulizer solution   Nebulization   Take 2.5 mLs (0.5 mg total) by nebulization 4 (four) times daily. DX: 496   300 mL   5     496   . pantoprazole (PROTONIX) 40 MG tablet   Oral   Take 40 mg by mouth daily.         . paricalcitol (ZEMPLAR) 2 MCG capsule   Oral   Take 2 mcg by mouth every other day.         . simvastatin (ZOCOR) 40 MG tablet   Oral   Take 40 mg by mouth every other day. Takes at bedtime.         . sodium chloride (OCEAN) 0.65 % SOLN nasal spray   Nasal   Place 2 sprays into the nose every 4 (four) hours as needed for congestion.         Marland Kitchen glucose blood test strip   Other   1 each by Other route 3 (three) times daily. Use as instructed ( This is for the SOLO V 2)   100 each   12   . Insulin Pen Needle 31G X 5 MM MISC      Use as directed   100 each   prn   . Insulin Pen Needle 31G X 5 MM MISC   Does not apply   31 g by Does not apply route once. Use as directed   100 each   1   . Lancets MISC   Does not apply   1 each by Does not apply route 3 (three) times daily. USE AS DIRECTED ( THIS IS FOR SOLO V 2 )   100 each   12     BP 139/50  Pulse 91  Temp(Src) 98.9 F (37.2 C) (Oral)  Resp 22  SpO2 93%  Physical Exam  Nursing note and vitals reviewed. Constitutional:  Uncomfortable. Tachypneic.   HENT:  Head: Normocephalic.  Mouth/Throat: Oropharynx is clear and moist.  Eyes: Conjunctivae are normal. Pupils are equal, round, and reactive to light.  Neck: Normal range of motion. Neck supple.  Cardiovascular: Normal rate, regular rhythm and normal heart sounds.   Pulmonary/Chest:  Tachypneic. Talks in 2-3  word sentences. + diffuse wheezing, poor air movement. No obvious crackles.   Abdominal: Soft. Bowel sounds are normal. He exhibits no distension. There is no tenderness. There is no rebound.  Obese   Musculoskeletal:  2+ edema bilateral legs  Neurological: He is alert. No cranial nerve deficit. Coordination normal.  Skin: Skin is warm and dry.  Psychiatric: He has a normal mood and affect. His behavior is normal. Judgment and thought content normal.    ED Course  Procedures (including critical care time)  CRITICAL CARE Performed by: Silverio Lay, DAVID   Total critical care time: 30 min   Critical care time was exclusive of separately billable procedures and treating other patients.  Critical care was necessary to treat or prevent imminent or life-threatening deterioration.  Critical care was time spent personally by me on the following activities: development of treatment plan with patient and/or surrogate as well as nursing, discussions with consultants, evaluation of patient's response to treatment, examination of patient, obtaining history from patient or surrogate, ordering and performing treatments and interventions, ordering and review of laboratory studies, ordering and review of radiographic studies, pulse oximetry and re-evaluation of patient's condition.   Labs Reviewed  CBC WITH DIFFERENTIAL - Abnormal; Notable for the following:    WBC 14.1 (*)    RBC 4.05 (*)    Hemoglobin 11.5 (*)    MCV 101.0 (*)    MCHC 28.1 (*)    Platelets 33 (*)    Neutro Abs 10.5 (*)    Monocytes Absolute 1.3 (*)    All other components within normal limits  COMPREHENSIVE METABOLIC PANEL - Abnormal; Notable for the following:    Chloride 89 (*)    CO2 >45 (*)    Glucose, Bld 145 (*)    Creatinine, Ser 1.68 (*)    Albumin 3.4 (*)    GFR calc non Af Amer 38 (*)    GFR calc Af Amer 45 (*)    All other components within normal limits  PRO B NATRIURETIC PEPTIDE - Abnormal; Notable for the  following:    Pro B Natriuretic peptide (BNP) 1198.0 (*)    All other components within normal limits  BLOOD GAS, ARTERIAL - Abnormal; Notable for the following:    pH, Arterial 7.280 (*)    pO2, Arterial 57.2 (*)    All other components within normal limits  TROPONIN I   Dg Chest 2 View  05/12/2012  *RADIOLOGY REPORT*  Clinical Data: Shortness of breath, cough  CHEST - 2 VIEW  Comparison: 02/16/2012  Findings: Chronic interstitial markings.  Linear scarring in the right mid lung.  No focal consolidation. No pleural effusion or pneumothorax.  The heart is normal in size.  Degenerative changes of the visualized thoracolumbar spine.  IMPRESSION: No evidence of acute cardiopulmonary disease.   Original Report Authenticated By: Charline Bills, M.D.    Ct Head Wo Contrast  05/12/2012  *RADIOLOGY REPORT*  Clinical Data: Altered mental status, confusion, multiple falls  CT HEAD WITHOUT CONTRAST  Technique:  Contiguous axial images were obtained from the base of the skull through the vertex without contrast.  Comparison: None.  Findings: Motion degraded images.  No evidence  of acute intracranial abnormality.  Intracranial atherosclerosis.  Cerebral volume is age appropriate.  No ventriculomegaly.  Partial opacification of the bilateral ethmoid sinuses.  The right mastoid air cells are partially opacified (series 3/image 8).  No evidence of calvarial fracture.  IMPRESSION: Motion degraded images.  No evidence of acute intracranial abnormality.   Original Report Authenticated By: Charline Bills, M.D.      No diagnosis found.   Date: 05/12/2012  Rate: 80  Rhythm: normal sinus rhythm  QRS Axis: normal  Intervals: normal  ST/T Wave abnormalities: normal  Conduction Disutrbances:none  Narrative Interpretation:   Old EKG Reviewed: unchanged    MDM  GINA COSTILLA is a 72 y.o. male here with SOB. Concerned for severe COPD exacerbation. Will give albuterol, solumedrol. Will also need to r/o CHF  (he is on lasix and he doesn't know why) with BNP. Will reassess and likely need admission.   2:47 PM Labs showed CO2 > 45, ABG showed pH 7.28, pCO2 57. CT head nl. CXR unremarkable. BNP at baseline. He was given IV steroids and albuterol. I will place him on bipap. I discussed with Dr. Zenaida Niece, who accepted him on stepdown.         Richardean Canal, MD 05/12/12 [phone]

## 2012-05-12 NOTE — ED Notes (Signed)
Pt is confused, states he's here to get his toe nails trimmed, pt states he does feel short of breath, unable to answer questions appropriately, sister states he's been falling a lot and not wearing his oxygen like he's suppose to, states he's suppose to wear continuous oxygen at 5.5L [place]. Pt gets very SOB w/ exertion or moving in bed.

## 2012-05-12 NOTE — Progress Notes (Signed)
ABG is out of reported range for PCO2. MD aware of results.

## 2012-05-12 NOTE — Progress Notes (Signed)
ABG results for 1800 blood draw= pH 7.26 / PCo2 too high to result in ABG analyzer, Po2 58.9 / Hco3 too high to result in ABG analyzer. Rn, Theresia Majors was made aware of these results at 1810 and asked to call MD so RT can pursue analyzer documentation issue (panic values).

## 2012-05-12 NOTE — Progress Notes (Signed)
[facility] ED CM noted CM consult CM went to assess pt He was being seen by Respiratory therapist x 2 CM spoke with pt who was confused Pt spoke about a "bridge and overpass" when CM inquired about a home health agency Respiratory therapists confirmed pt also noted with confusion throughout their assessment of him CM services/interventions pending at this time

## 2012-05-12 NOTE — ED Notes (Signed)
Patient transported to CT 

## 2012-05-12 NOTE — ED Notes (Signed)
Per EMS pt's family called EMS, pt acting confused/abnormal at home, climbed on top of dresser at house, 77% RA when EMS arrived, usually on 5L oxygen at home, placed back on oxygen came up to 90's, became a/o then, could answer all questions but what day it was. Pt denies any complaints at this time, ambulated 10-15 feet for EMS became SOB and dropped oxygen level in 70's again on oxygen. BP 142/72, HR 86 regular, RR 20 at rest, 97% 6L [place]. CBG 153.

## 2012-05-12 NOTE — ED Notes (Signed)
Patient transported to X-ray 

## 2012-05-12 NOTE — Progress Notes (Signed)
CSW discussed case with attending. Per attending, patient has altered mental status and unable to provide much history however pt is oriented to date and location. Per attending pt currently on bipap. CSW will follow to complete psychosocial assessment once patient oxygen levels have stabilized and patient is able to engage in assessment.   Catha Gosselin, LCSWA  [phone] .05/12/2012 1524pm

## 2012-05-12 NOTE — Consult Note (Addendum)
PULMONARY  / CRITICAL CARE MEDICINE  Name: [name] [name] MRN: [id number] DOB: [birth year]    ADMISSION DATE:  05/12/2012 CONSULTATION DATE:  05/12/2012  REFERRING MD :  Dr. Benjamine Mola  CHIEF COMPLAINT:  Respiratory failure/altered mental status  BRIEF PATIENT DESCRIPTION:   72 yo male with morbid obesity, OHS & COPD with chronic resp failure followed by Dr. Sherene Sires. Admitted with recurrent respiratory failure and CO2 narcosis.    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 Admitted earlier today due to recurrent respiratory family.  Patient unable to provide much history. According to notes, family called EMS as patient was acting confused and abnormal at home, apparently there are reports, he climbed on top of a dresser. When EMS arrived, his O2 sat was 77% on RA- it is reported that he wears 5 L of O2. In the ER, his CO2 on BMP was > 45- ABG was done and CO2 was so high it would not record. Ph 7.280 paO2 88% on 6L.  Started on BIPAP. Now much more awake. Talking in full sentences. Denies CP or SOB. + cough productive of white sputum. No fevers. Possible chills. Still smoking 1pack per week.   CXR: No acute infiltrate or CHF. Baseline pCO2 on previous ABGs seems to be in 70s.   Repeat ABG 7.296/105/75/95%    PAST MEDICAL HISTORY :  Past Medical History  Diagnosis Date  . Renal insufficiency   . Diabetes mellitus   . GERD (gastroesophageal reflux disease)   . Gastroparesis   . Esophageal stricture   . Hyperlipemia   . Hypertension   . Pancreatitis   . Chronic respiratory disease   .  Depression   . Seizure disorder   . Arthritis   . Diverticulosis   . Sleep apnea     CPAP Machine  . Obesity   . ED (erectile dysfunction)   . Sleep apnea   . COPD (chronic obstructive pulmonary disease)     Gold Stage  III/IV  . H/O polycythemia   . Chronic kidney disease (CKD), stage III (moderate)   . DDD (degenerative disc disease), lumbar   . History of esophageal stricture   . Erectile dysfunction   . Prostate cancer   . Peptic ulcer disease   . History of colon polyps   . Gout   . Gastroparesis    Past Surgical History  Procedure Laterality Date  . Prostate surgery    . Coronary artery bypass graft      x 4  . Bone marrow biopsy    . Cardiac catheterization  05/02/2008  . Back surgery      lumbar   Prior to Admission medications   Medication Sig Start Date End Date Taking? Authorizing Provider  acetaminophen (TYLENOL) 500 MG tablet Take 500 mg by mouth every 6 (six) hours as needed for pain ("per bottle").   Yes Historical Provider, MD  albuterol (PROVENTIL) (2.5 MG/3ML) 0.083% nebulizer solution Take 5 mg by nebulization every 4 (four) hours as needed for wheezing or shortness of breath. Dx 496 04/25/12  Yes Ronnald Nian, MD  bisoprolol (ZEBETA) 5  MG tablet Take 1 tablet (5 mg total) by mouth 2 (two) times daily. 12/24/11 12/23/12 Yes Ronnald Nian, MD  budesonide (PULMICORT) 0.25 MG/2ML nebulizer solution Take 0.25 mg by nebulization 2 (two) times daily.   Yes Historical Provider, MD  calcitRIOL (ROCALTROL) 0.25 MCG capsule Take 1 capsule (0.25 mcg total) by mouth daily. 12/24/11  Yes Ronnald Nian, MD  dextromethorphan-guaiFENesin [facility] DM) 30-600 MG per 12 hr tablet Take 1 tablet by mouth every 12 (twelve) hours as needed (cough/congestion).   Yes Historical Provider, MD  furosemide (LASIX) 40 MG tablet Take 80-120 mg by mouth 2 (two) times daily with a meal. Take 3 tablets every morning. Take 2 tablets every  Night.   Yes Historical Provider, MD  insulin  glargine (LANTUS) 100 UNIT/ML injection Inject 10 Units into the skin 2 (two) times daily. 12/28/10  Yes Ronnald Nian, MD  insulin glulisine (APIDRA) 100 UNIT/ML injection Inject 15 Units into the skin 3 (three) times daily before meals.   Yes Historical Provider, MD  ipratropium (ATROVENT) 0.02 % nebulizer solution Take 2.5 mLs (0.5 mg total) by nebulization 4 (four) times daily. DX: 496 04/25/12 04/25/13 Yes Ronnald Nian, MD  pantoprazole (PROTONIX) 40 MG tablet Take 40 mg by mouth daily.   Yes Historical Provider, MD  paricalcitol (ZEMPLAR) 2 MCG capsule Take 2 mcg by mouth every other day.   Yes Historical Provider, MD  simvastatin (ZOCOR) 40 MG tablet Take 40 mg by mouth every other day. Takes at bedtime.   Yes Historical Provider, MD  sodium chloride (OCEAN) 0.65 % SOLN nasal spray Place 2 sprays into the nose every 4 (four) hours as needed for congestion.   Yes Historical Provider, MD  glucose blood test strip 1 each by Other route 3 (three) times daily. Use as instructed ( This is for the SOLO V 2) 04/25/12   Ronnald Nian, MD  Insulin Pen Needle 31G X 5 MM MISC Use as directed 05/07/11   Ronnald Nian, MD  Insulin Pen Needle 31G X 5 MM MISC 31 g by Does not apply route once. Use as directed 12/24/11   Ronnald Nian, MD  Lancets MISC 1 each by Does not apply route 3 (three) times daily. USE AS DIRECTED ( THIS IS FOR SOLO V 2 ) 04/25/12   Ronnald Nian, MD   No Known Allergies  FAMILY HISTORY:  Family History  Problem Relation Age of Onset  . Colon cancer Neg Hx   . Cancer Brother     unsure what kind  . Heart disease Mother   . Heart disease Father   . Emphysema Father    SOCIAL HISTORY:  reports that he has been smoking Cigarettes.  He has a 60 pack-year smoking history. He has never used smokeless tobacco. He reports that he does not drink alcohol or use illicit drugs.  REVIEW OF SYSTEMS:  In-depth review of systems unavailable as patient still mildly confused.  Denies CP or  SOB. + white sputum. No fevers, ? chills   VITAL SIGNS: Temp:  [98.4 F (36.9 C)-99 F (37.2 C)] 98.4 F (36.9 C) (03/14 2000) Pulse Rate:  [72-91] 72 (03/14 1843) Resp:  [17-32] 32 (03/14 1843) BP: (114-155)/(44-65) 114/44 mmHg (03/14 1843) SpO2:  [80 %-100 %] 96 % (03/14 1843) FiO2 (%):  [35 %] 35 % (03/14 1510) HEMODYNAMICS:   VENTILATOR SETTINGS: Vent Mode:  [-]  FiO2 (%):  [35 %] 35 % INTAKE / OUTPUT:  Intake/Output   None     PHYSICAL EXAMINATION: General:  Obese male lying flat in bed on BIPAP Neuro:  Awake. Follows commands. Answers most questions. Oriented to person only. HEENT:  Normal except for poor dentition. Very thick neck. Unable to assess JVD. Carotids 2+ bilaterally without bruit Cardiovascular:  PMI non-palpable. RRR no obvious murmur Lungs:  Very poor air movement. No obvious wheeze Abdomen:  Obese. NT/ND . Good BS Extremities. No cyanosis. Tr edema.  Skin:  No rash  LABS:  Recent Labs Lab 05/12/12 1300 05/12/12 1430 05/12/12 1756  HGB 11.5*  --   --   WBC 14.1*  --   --   PLT 33*  --   --   NA 144  --   --   K 3.8  --   --   CL 89*  --   --   CO2 >45*  --   --   GLUCOSE 145*  --   --   BUN 23  --   --   CREATININE 1.68*  --   --   CALCIUM 10.1  --   --   AST 21  --   --   ALT 28  --   --   ALKPHOS 81  --   --   BILITOT 0.6  --   --   PROT 7.5  --   --   ALBUMIN 3.4*  --   --   TROPONINI <0.30  --   --   PROBNP 1198.0*  --   --   PHART  --  7.280* 7.263*  PCO2ART  --  PENDING 0*  PO2ART  --  57.2* 58.9*    Recent Labs Lab 05/12/12 1854  GLUCAP 168*    CXR: COPD. No acute infiltrate or edema.  ASSESSMENT / PLAN:  PULMONARY A: Acute on chronic hypercarbic respiratory failure in setting of OHS/COPD with ongoing tobacco use P:   Patient improving steadily with BiPAP but pCO2 still high. Will treat for COPD flare with abx/nebs and steroids. Will follow respiratory status closely overnight. Check sputum CX. Will stop bisoprolol  for now.   CARDIOVASCULAR A: Chronic diastolic HF P:  Very mild overload on exam. Weight actually at baseline. Agree with one dose IV lasix to help respiratory status.  RENAL A:  Chronic renal failure P:   At baseline.   GASTROINTESTINAL A:  H/o GERD P:   Continue PPI.    INFECTIOUS A:  Acute bronchitis/COPD flare. P:   Treat as above  ENDOCRINE A:  Diabetes Mellitus P:   Continue with insulin. Cover with resistant SSI while on steroids.   NEUROLOGIC A:  Acute delirium P:   Due to CO2 narcosis. Improving with BIPAP. Will continue to follow.   MISC - heparin for DVT prophylaxis, On PPI.  I have personally obtained a history, examined the patient, evaluated laboratory and imaging results, formulated the assessment and plan and placed orders. CRITICAL CARE: The patient is critically ill with multiple organ systems failure and requires high complexity decision making for assessment and support, frequent evaluation and titration of therapies, application of advanced monitoring technologies and extensive interpretation of multiple databases. Critical Care Time devoted to patient care services described in this note is 60 minutes.    Pulmonary and Critical Care Medicine [facility] Pager: [phone]  Truman Hayward 9:28 PM   05/12/2012, 8:17 PM  Thrombocytopenia  We will closely follow up; hold lovenox for now; repeat CBC in am,  Vanetta Mulders, MD

## 2012-05-12 NOTE — ED Notes (Signed)
Report called to [facility] in ICU.  ICU wants [place] to hold patient in ED for 30-35min. Maple Mirza RN aware.

## 2012-05-12 NOTE — ED Notes (Signed)
[id number]<AV> Expected date:<BR> Expected time:<BR> Means of arrival:<BR> Comments:<BR> Confusion/off home o2 x2hr, no SOB

## 2012-05-12 NOTE — ED Notes (Signed)
Respiratory contacted to complete ABG 

## 2012-05-13 ENCOUNTER — Inpatient Hospital Stay (HOSPITAL_COMMUNITY): Payer: Medicare Other

## 2012-05-13 DIAGNOSIS — G9340 Encephalopathy, unspecified: Secondary | ICD-10-CM

## 2012-05-13 DIAGNOSIS — G4733 Obstructive sleep apnea (adult) (pediatric): Secondary | ICD-10-CM

## 2012-05-13 DIAGNOSIS — J449 Chronic obstructive pulmonary disease, unspecified: Secondary | ICD-10-CM

## 2012-05-13 DIAGNOSIS — E662 Morbid (severe) obesity with alveolar hypoventilation: Secondary | ICD-10-CM

## 2012-05-13 DIAGNOSIS — R0902 Hypoxemia: Secondary | ICD-10-CM

## 2012-05-13 DIAGNOSIS — J9620 Acute and chronic respiratory failure, unspecified whether with hypoxia or hypercapnia: Secondary | ICD-10-CM

## 2012-05-13 DIAGNOSIS — N189 Chronic kidney disease, unspecified: Secondary | ICD-10-CM

## 2012-05-13 LAB — GLUCOSE, CAPILLARY
Glucose-Capillary: 126 mg/dL — ABNORMAL HIGH (ref 70–99)
Glucose-Capillary: 149 mg/dL — ABNORMAL HIGH (ref 70–99)
Glucose-Capillary: 176 mg/dL — ABNORMAL HIGH (ref 70–99)

## 2012-05-13 LAB — BLOOD GAS, ARTERIAL
Bicarbonate: 51.5 mEq/L — ABNORMAL HIGH (ref 20.0–24.0)
Drawn by: 308601
Expiratory PAP: 14
FIO2: 0.5 %
FIO2: 0.5 %
O2 Saturation: 94.1 %
O2 Saturation: 96.3 %
Patient temperature: 98.6
pCO2 arterial: 102 mmHg (ref 35.0–45.0)
pH, Arterial: 7.325 — ABNORMAL LOW (ref 7.350–7.450)
pH, Arterial: 7.433 (ref 7.350–7.450)
pO2, Arterial: 85.3 mmHg (ref 80.0–100.0)

## 2012-05-13 LAB — CBC
MCH: 28.1 pg (ref 26.0–34.0)
MCV: 98.9 fL (ref 78.0–100.0)
Platelets: 61 10*3/uL — ABNORMAL LOW (ref 150–400)
RDW: 14.2 % (ref 11.5–15.5)
WBC: 7.9 10*3/uL (ref 4.0–10.5)

## 2012-05-13 LAB — HIV ANTIBODY (ROUTINE TESTING W REFLEX): HIV: NONREACTIVE

## 2012-05-13 LAB — COMPREHENSIVE METABOLIC PANEL
Albumin: 3.1 g/dL — ABNORMAL LOW (ref 3.5–5.2)
Alkaline Phosphatase: 63 U/L (ref 39–117)
BUN: 26 mg/dL — ABNORMAL HIGH (ref 6–23)
CO2: >45 mEq/L (ref 19–32)
Chloride: 94 mEq/L — ABNORMAL LOW (ref 96–112)
Potassium: 4.4 mEq/L (ref 3.5–5.1)
Total Bilirubin: 0.4 mg/dL (ref 0.3–1.2)

## 2012-05-13 LAB — HEMOGLOBIN A1C: Hgb A1c MFr Bld: 7.2 % — ABNORMAL HIGH (ref <5.7)

## 2012-05-13 MED ORDER — METHYLPREDNISOLONE SODIUM SUCC 125 MG IJ SOLR
80.0000 mg | Freq: Four times a day (QID) | INTRAMUSCULAR | Status: DC
  Administered 2012-05-13 (×2): 80 mg via INTRAVENOUS
  Filled 2012-05-13 (×5): qty 1.28

## 2012-05-13 MED ORDER — ACETAZOLAMIDE SODIUM 500 MG IJ SOLR
250.0000 mg | Freq: Two times a day (BID) | INTRAMUSCULAR | Status: AC
  Administered 2012-05-13 – 2012-05-16 (×6): 250 mg via INTRAVENOUS
  Filled 2012-05-13 (×8): qty 500

## 2012-05-13 MED ORDER — METHYLPREDNISOLONE SODIUM SUCC 125 MG IJ SOLR
80.0000 mg | INTRAMUSCULAR | Status: DC
  Filled 2012-05-13 (×7): qty 1.28

## 2012-05-13 MED ORDER — CHLORHEXIDINE GLUCONATE 0.12 % MT SOLN
15.0000 mL | Freq: Two times a day (BID) | OROMUCOSAL | Status: DC
  Administered 2012-05-13 – 2012-05-16 (×7): 15 mL via OROMUCOSAL
  Filled 2012-05-13 (×9): qty 15

## 2012-05-13 MED ORDER — BIOTENE DRY MOUTH MT LIQD
15.0000 mL | Freq: Two times a day (BID) | OROMUCOSAL | Status: DC
  Administered 2012-05-13 – 2012-05-16 (×7): 15 mL via OROMUCOSAL

## 2012-05-13 NOTE — Consult Note (Signed)
Pt's sister Ms. Lynden Oxford [phone], would like to speak with SW regarding getting placement in assisted living or SNIF for her brother. Stated he is no longer able to care for himself. At times she feels he has over medicated himself and neglected his DM management. When at home at times he is unable to answer the phone due to elevated confusion and has been declining since December 2013.

## 2012-05-13 NOTE — Progress Notes (Addendum)
[facility] PROGRESS NOTE  [name] [name] RUE:[id number] DOB: 01-Sep-1940 DOA: 05/12/2012 PCP: Carollee Herter, MD  Assessment/Plan: 1. Acute on chronic respiratory Failure: COPD exacerbation, severe underlying COPD, OSA/OHS -  PH improving, but PCO2 still very high and at this point mentating well - Suspect baseline CO2 is very high - will repeat ABG today to get a sense of baseline, Off BIPAP on Ventimask this am - currently mentating well and distress improved  - continue IV rocephin/zithromax, solumedrol - albuterol and atrovent nebs  - PCCM following, followed by Dr.Wert in office  2. DM: resume lantus, hold meal coverage, SSI  3. Tobacco abuse: counselled  4. 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 Thrombocytopenia: chronic, monitor  6. OSA: non complaint with CPAP  7. Poor living condition/unable to care for self/medication non complaince: needs ALF at DC  Code Status: FULL Family Communication: d/w sister: [phone] Lynden [place]) Disposition Plan: keep in SDU today   Consultants:  PCCM   Antibiotics:  Rocephin  zithromax  HPI/Subjective: Feels better, breathing better  Objective: Filed Vitals:   05/13/12 0412 05/13/12 0500 05/13/12 0600 05/13/12 0700  BP: 125/77 132/47 149/45   Pulse: 70 71 74 71  Temp:  99.1 F (37.3 C)    TempSrc:  Core (Comment)    Resp: 16 15 21 22   Height:      Weight:      SpO2: 100% 93% 98% 96%    Intake/Output Summary (Last 24 hours) at 05/13/12 0808 Last data filed at 05/13/12 0543  Gross per 24 hour  Intake    950 ml  Output      0 ml  Net    950 ml   Filed Weights   05/12/12 2000  Weight: 112.8 kg (248 lb 10.9 oz)    Exam:   General:  AAo to self and place, partly to time  Cardiovascular: S1S2/RRR  Respiratory: poor air movement B/L  Abdomen: soft, distended, BS present  Ext: no edema c/c  Data Reviewed: Basic  Metabolic Panel:  Recent Labs Lab 05/12/12 1300 05/13/12 0350  NA 144 146*  K 3.8 4.4  CL 89* 94*  CO2 >45* >45*  GLUCOSE 145* 160*  BUN 23 26*  CREATININE 1.68* 1.69*  CALCIUM 10.1 9.6   Liver Function Tests:  Recent Labs Lab 05/12/12 1300 05/13/12 0350  AST 21 14  ALT 28 21  ALKPHOS 81 63  BILITOT 0.6 0.4  PROT 7.5 6.7  ALBUMIN 3.4* 3.1*   No results found for this basename: LIPASE, AMYLASE,  in the last 168 hours No results found for this basename: AMMONIA,  in the last 168 hours CBC:  Recent Labs Lab 05/12/12 1300 05/13/12 0350  WBC 14.1* 7.9  NEUTROABS 10.5*  --   HGB 11.5* 10.3*  HCT 40.9 36.2*  MCV 101.0* 98.9  PLT 33* 61*   Cardiac Enzymes:  Recent Labs Lab 05/12/12 1300  TROPONINI <0.30   BNP (last 3 results)  Recent Labs  05/12/12 1300  PROBNP 1198.0*   CBG:  Recent Labs Lab 05/12/12 1854 05/13/12 0028  GLUCAP 168* 149*    Recent Results (from the past 240 hour(s))  MRSA PCR SCREENING     Status: None   Collection Time    05/12/12  5:55 PM      Result Value Range Status   MRSA by PCR NEGATIVE  NEGATIVE Final   Comment:  The GeneXpert MRSA Assay (FDA     approved for NASAL specimens     only), is one component of a     comprehensive MRSA colonization     surveillance program. It is not     intended to diagnose MRSA     infection nor to guide or     monitor treatment for     MRSA infections.     Studies: Dg Chest 2 View  05/12/2012  *RADIOLOGY REPORT*  Clinical Data: Shortness of breath, cough  CHEST - 2 VIEW  Comparison: 02/16/2012  Findings: Chronic interstitial markings.  Linear scarring in the right mid lung.  No focal consolidation. No pleural effusion or pneumothorax.  The heart is normal in size.  Degenerative changes of the visualized thoracolumbar spine.  IMPRESSION: No evidence of acute cardiopulmonary disease.   Original Report Authenticated By: Charline Bills, M.D.    Ct Head Wo  Contrast  05/12/2012  *RADIOLOGY REPORT*  Clinical Data: Altered mental status, confusion, multiple falls  CT HEAD WITHOUT CONTRAST  Technique:  Contiguous axial images were obtained from the base of the skull through the vertex without contrast.  Comparison: None.  Findings: Motion degraded images.  No evidence of acute intracranial abnormality.  Intracranial atherosclerosis.  Cerebral volume is age appropriate.  No ventriculomegaly.  Partial opacification of the bilateral ethmoid sinuses.  The right mastoid air cells are partially opacified (series 3/image 8).  No evidence of calvarial fracture.  IMPRESSION: Motion degraded images.  No evidence of acute intracranial abnormality.   Original Report Authenticated By: Charline Bills, M.D.    Dg Chest Port 1 View  05/13/2012  *RADIOLOGY REPORT*  Clinical Data: Increase shortness of breath.  PORTABLE CHEST - 1 VIEW  Comparison: One-view chest 05/12/2012  Findings: The heart size is normal.  Pulmonary vascular congestion is slightly increased.  Minimal bibasilar atelectasis is similar to the prior exam.  IMPRESSION:  1.  increase in mild pulmonary vascular congestion. 2.  Mild bibasilar atelectasis.   Original Report Authenticated By: Marin Roberts, M.D.     Scheduled Meds: . albuterol  2.5 mg Nebulization Q6H  . antiseptic oral rinse  15 mL Mouth Rinse q12n4p  . azithromycin  500 mg Intravenous Q24H  . calcitRIOL  0.25 mcg Oral Daily  . cefTRIAXone (ROCEPHIN)  IV  1 g Intravenous Q24H  . chlorhexidine  15 mL Mouth Rinse BID  . furosemide  120 mg Oral Q breakfast  . furosemide  80 mg Oral q1800  . insulin aspart  0-15 Units Subcutaneous TID WC  . insulin aspart  0-5 Units Subcutaneous QHS  . insulin glargine  10 Units Subcutaneous BID  . ipratropium  0.5 mg Nebulization Q6H  . methylPREDNISolone (SOLU-MEDROL) injection  80 mg Intravenous Q4H  . pantoprazole  40 mg Oral Daily  . paricalcitol  2 mcg Oral Q48H  . simvastatin  40 mg Oral Q48H   . sodium chloride  3 mL Intravenous Q12H   Continuous Infusions:   Principal Problem:   CO2 retention Active Problems:   DIABETES MELLITUS, TYPE II   Morbid obesity   TOBACCO USER   OSA (obstructive sleep apnea)   Chronic renal failure   Leukocytosis   Encephalopathy acute   Thrombocytopenia    Time spent:    [facility]  [facility] Pager [phone]. If 7PM-7AM, please contact night-coverage at [email or web address], password [facility] 05/13/2012, 8:08 AM  LOS: 1 day

## 2012-05-13 NOTE — Progress Notes (Signed)
Pt taken off BIPAP due to agitation, Pt placed on 50%  Venti Mask and tolerating well at this time, RT to monitor and assess as needed

## 2012-05-13 NOTE — Progress Notes (Signed)
PULMONARY  / CRITICAL CARE MEDICINE  Name: [name] [name] MRN: [id number] DOB: [birth year]    ADMISSION DATE:  05/12/2012 CONSULTATION DATE:  05/12/2012  REFERRING MD :  Dr. Benjamine Mola  CHIEF COMPLAINT:  Respiratory failure/altered mental status  BRIEF PATIENT DESCRIPTION:   72 yo male with morbid obesity, OHS & COPD with chronic resp failure followed by Dr. Sherene Sires. Admitted with recurrent respiratory failure and CO2 narcosis.    SIGNIFICANT EVENTS / STUDIES:  Bipap started 05/12/2012  LINES / TUBES: PIV x1 05/12/2012 Foley  CULTURES: Sputum cx - Pending  ANTIBIOTICS:  Azithro start 05/12/2012 >> 3/15 Ceftriaxone start 05/12/12 >> 3/15  HISTORY OF PRESENT ILLNESS:    72 y/o male with multiple medical problems including morbid obesity, chronic hypercarbic respiratory failure due to OHS & COPD with ongoing tobacco use, chronic renal failure (baseline 1.7-1.9), OSA, DM2. Admitted earlier today due to recurrent respiratory failure.  Patient unable to provide much history. According to notes, family called EMS as patient was acting confused and abnormal at home, apparently there are reports, he climbed on top of a dresser. When EMS arrived, his O2 sat was 77% on RA- it is reported that he wears 5 L of O2. In the ER, his CO2 on BMP was > 45- ABG was done and CO2 was so high it would not record. Ph 7.280 paO2 88% on 6L.  Started on BIPAP. Now much more awake. Talking in full sentences. Denies CP or SOB. + cough productive of white sputum. No fevers. Possible chills. Still smoking 1pack per week.   CXR: No acute infiltrate or CHF. Baseline pCO2 on previous ABGs seems to be in 70s.   Repeat ABG 7.296/105/75/95%  Interval events:  Became agitated this am, BiPAP removed and placed on venti mask Confused about events that led to admission  VITAL SIGNS: Temp:  [98.4 F (36.9 C)-99.1 F (37.3 C)] 98.7 F (37.1 C) (03/15 1200) Pulse Rate:  [61-82] 66 (03/15 1100) Resp:  [11-32] 27 (03/15  1100) BP: (111-207)/(41-179) 147/59 mmHg (03/15 1100) SpO2:  [80 %-100 %] 95 % (03/15 1100) FiO2 (%):  [35 %-70 %] 50 % (03/15 1000) Weight:  [112.8 kg (248 lb 10.9 oz)] 112.8 kg (248 lb 10.9 oz) (03/14 2000) HEMODYNAMICS:   VENTILATOR SETTINGS: Vent Mode:  [-]  FiO2 (%):  [35 %-70 %] 50 % INTAKE / OUTPUT: Intake/Output     03/14 0701 - 03/15 0700 03/15 0701 - 03/16 0700   P.O.  360   I.V. (mL/kg) 10 (0.1) 40 (0.4)   Other 650    IV Piggyback 300    Total Intake(mL/kg) 960 (8.5) 400 (3.5)   Net +960 +400          PHYSICAL EXAMINATION: General:  Obese male up in chair, NAD on venti mask Neuro:  Awake. Follows commands. Answers most questions. Oriented to person only. Confused about events on admission HEENT:  Normal except for poor dentition. Very thick neck. Unable to assess JVD. Carotids 2+ bilaterally without bruit Cardiovascular:  PMI non-palpable. RRR no obvious murmur Lungs:  Very poor air movement. No obvious wheeze Abdomen:  Obese. NT/ND . Good BS Extremities. No cyanosis. Tr edema.  Skin:  No rash  LABS  Recent Labs Lab 05/12/12 1300 05/13/12 0350  HGB 11.5* 10.3*  HCT 40.9 36.2*  WBC 14.1* 7.9  PLT 33* 61*    Recent Labs Lab 05/12/12 1300 05/13/12 0350  NA 144 146*  K 3.8 4.4  CL 89* 94*  CO2 >45* >45*  GLUCOSE 145* 160*  BUN 23 26*  CREATININE 1.68* 1.69*  CALCIUM 10.1 9.6    Recent Labs Lab 05/12/12 1300 05/13/12 0350  AST 21 14  ALT 28 21  ALKPHOS 81 63  BILITOT 0.6 0.4  PROT 7.5 6.7  ALBUMIN 3.4* 3.1*    Recent Labs Lab 05/12/12 1430 05/12/12 1756 05/12/12 2012 05/13/12 0023 05/13/12 0819  PHART 7.280* 7.263* 7.296* 7.325* 7.433  PCO2ART PENDING 0* 105.0* 102.0* 77.3*  PO2ART 57.2* 58.9* 75.0* 85.3 65.5*  HCO3 PENDING O 49.4* 51.5* 50.7*  TCO2 PENDING PENDING 46.4 48.4 46.7  O2SAT 88.0 88.1 95.2 96.3 94.1     Recent Labs Lab 05/12/12 1854 05/13/12 0028 05/13/12 0430 05/13/12 0747 05/13/12 1203  GLUCAP 168* 149*  151* 179* 149*    CXR: COPD. No acute infiltrate or edema.  ASSESSMENT / PLAN:  PULMONARY A: Acute on chronic combined respiratory failure in setting of OHS/COPD with ongoing tobacco use. No wheeze on exam 3/15am. Notes indicate that he has some positive pressure device at home, Not clear is this is CPAP or BiPAP P:   - continue scheduled BD's - d/c steroids given his confusion/delirium and in absence wheezing - BiPAP qhs mandatory - diamox x 3 days starting 3/15 to assist with re-setting his baseline CO2 and thereby pCO2  CARDIOVASCULAR A: Chronic diastolic HF P:  Very mild overload on exam. Weight actually at baseline. Single dose IV lasix given 3/14 OK to restart bisoprolol 3/15  RENAL A:  Chronic renal failure P:   At baseline.   GASTROINTESTINAL A:  H/o GERD P:   Continue PPI.    INFECTIOUS A:  No evidence CXR for CAP P:   - d/c ceftriaxone and azithro 3/15  HEME: A: Thrombocytopenia, improving P: - heparinoids on hold - consider HITT panel??   ENDOCRINE A:  Diabetes Mellitus P:   Continue with insulin.   NEUROLOGIC A:  Acute delirium Due to CO2 narcosis + possible steroid-associated delirium P:   - Improving with BIPAP. Will continue to follow.  - d/c the steroids 3/15  MISC - heparin for DVT prophylaxis, On PPI.  I have personally obtained a history, examined the patient, evaluated laboratory and imaging results, formulated the assessment and plan and placed orders.  CRITICAL CARE: The patient is critically ill with multiple organ systems failure and requires high complexity decision making for assessment and support, frequent evaluation and titration of therapies, application of advanced monitoring technologies and extensive interpretation of multiple databases. Critical Care Time devoted to patient care services described in this note is 30 minutes.    Pulmonary and Critical Care Medicine [facility] Pager: [phone]  Payam Gribble  S.,MD 12:49 PM   05/13/2012, 12:49 PM  Thrombocytopenia  We will closely follow up; hold lovenox for now; repeat CBC in am,   Vanetta Mulders, MD

## 2012-05-13 NOTE — Progress Notes (Signed)
Pt seen awake and alert, sitting up in chair no distress/confusion noted.  Pt talking without distress, currently on 6lnc.  Neb tx given without incident.  RT will monitor pt for need of bipap and will place on bipap for rest as per md's notes.

## 2012-05-13 NOTE — Evaluation (Signed)
Physical Therapy Evaluation Patient Details Name: [name] [name] MRN: [id number] DOB: 11/21/1940 Today's Date: 05/13/2012 Time: 4098-1191 PT Time Calculation (min): 25 min  PT Assessment / Plan / Recommendation Clinical Impression  Pt presents with CO2 retention with history of COPD, CKD, HTN and CAD.  He is now on 50% venti mask and was able to tolerate OOB to chair very well with +2 assist for safety.  Sats dropped to 88% during mobility, however he was able to quickly recover to 95% or higher.  Pt will benefit from skilled PT in acute venue to address deficits.  PT recommends ST SNF for follow up at D/C to maximize pts safety and function.     PT Assessment  Patient needs continued PT services    Follow Up Recommendations  SNF;Supervision/Assistance - 24 hour    Does the patient have the potential to tolerate intense rehabilitation      Barriers to Discharge Decreased caregiver support      Equipment Recommendations  Rolling walker with 5" wheels    Recommendations for Other Services OT consult   Frequency Min 3X/week    Precautions / Restrictions Precautions Precautions: Fall Precaution Comments: on 50% venti mask Restrictions Weight Bearing Restrictions: No   Pertinent Vitals/Pain No pain      Mobility  Bed Mobility Bed Mobility: Supine to Sit;Sitting - Scoot to Edge of Bed Supine to Sit: 3: Mod assist;HOB elevated;With rails Sitting - Scoot to Edge of Bed: 4: Min assist Details for Bed Mobility Assistance: Pt with some difficulty initally with following commands for getting to EOB, however he was able to bring BLE off of bed and required some assist for trunk to get into sitting.   Transfers Transfers: Sit to Stand;Stand to Sit;Stand Pivot Transfers Sit to Stand: 1: +2 Total assist;With upper extremity assist;From bed Sit to Stand: Patient Percentage: 60% Stand to Sit: 1: +2 Total assist;With upper extremity assist;With armrests;To chair/3-in-1 Stand to Sit:  Patient Percentage: 60% Details for Transfer Assistance: Pt does very well with mobility, however he was somewhat nervous about getting up.  cues for hand placement, safety, and sequencing/technique for transfer.  Maintained pt on 50% venti mask with pts sats dropping to 88% once in chair, however quickly recovered to 95%.   Ambulation/Gait Ambulation/Gait Assistance: Not tested (comment) Ambulation/Gait Assistance Details: Deferred due to pulmonary status.  Stairs: No Wheelchair Mobility Wheelchair Mobility: No    Exercises     PT Diagnosis: Difficulty walking;Generalized weakness  PT Problem List: Decreased strength;Decreased activity tolerance;Decreased balance;Decreased mobility;Cardiopulmonary status limiting activity;Decreased knowledge of use of DME;Decreased safety awareness;Decreased knowledge of precautions PT Treatment Interventions: DME instruction;Gait training;Functional mobility training;Therapeutic activities;Therapeutic exercise;Balance training;Patient/family education   PT Goals Acute Rehab PT Goals PT Goal Formulation: With patient Time For Goal Achievement: 05/27/12 Potential to Achieve Goals: Good Pt will go Supine/Side to Sit: with supervision PT Goal: Supine/Side to Sit - Progress: Goal set today Pt will go Sit to Supine/Side: with supervision PT Goal: Sit to Supine/Side - Progress: Goal set today Pt will go Sit to Stand: with supervision PT Goal: Sit to Stand - Progress: Goal set today Pt will go Stand to Sit: with supervision PT Goal: Stand to Sit - Progress: Goal set today Pt will Ambulate: 51 - 150 feet;with min assist;with least restrictive assistive device PT Goal: Ambulate - Progress: Goal set today  Visit Information  Last PT Received On: 05/13/12 Assistance Needed: +2 (safety)    Subjective Data  Subjective: I would love to  get out of this bed.  Patient Stated Goal: to return home.    Prior Functioning  Home Living Lives With: Alone Available  Help at Discharge: Family;Available PRN/intermittently (grandson runs errands for him) Type of Home: House Home Access: Level entry Home Layout: One level Home Adaptive Equipment: Straight cane Prior Function Level of Independence: Independent with assistive device(s) Able to Take Stairs?: No Driving: No Vocation: Retired Musician: No difficulties    Copywriter, advertising Overall Cognitive Status: Appears within functional limits for tasks assessed/performed Arousal/Alertness: Awake/alert Orientation Level: Appears intact for tasks assessed Behavior During Session: [facility] for tasks performed Cognition - Other Comments: Pt would say some things out of the ordinary, however mostly [facility]    Extremity/Trunk Assessment Right Lower Extremity Assessment RLE ROM/Strength/Tone: Deficits RLE ROM/Strength/Tone Deficits: Grossly WFL per functional assessment.  Left Lower Extremity Assessment LLE ROM/Strength/Tone: Deficits LLE ROM/Strength/Tone Deficits: Grossly WFL per functional assessment.  Trunk Assessment Trunk Assessment: Normal   Balance    End of Session PT - End of Session Equipment Utilized During Treatment: Gait belt;Oxygen Activity Tolerance: Patient tolerated treatment well Patient left: in chair;with call bell/phone within reach Nurse Communication: Mobility status  GP     Vista Deck 05/13/2012, 1:31 PM

## 2012-05-14 LAB — TSH: TSH: 1.464 u[IU]/mL (ref 0.350–4.500)

## 2012-05-14 LAB — BASIC METABOLIC PANEL
BUN: 38 mg/dL — ABNORMAL HIGH (ref 6–23)
Chloride: 91 mEq/L — ABNORMAL LOW (ref 96–112)
Creatinine, Ser: 1.93 mg/dL — ABNORMAL HIGH (ref 0.50–1.35)
GFR calc Af Amer: 38 mL/min — ABNORMAL LOW (ref >90)
GFR calc non Af Amer: 33 mL/min — ABNORMAL LOW (ref >90)
Potassium: 3.7 mEq/L (ref 3.5–5.1)

## 2012-05-14 LAB — GLUCOSE, CAPILLARY: Glucose-Capillary: 176 mg/dL — ABNORMAL HIGH (ref 70–99)

## 2012-05-14 LAB — CBC
HCT: 35.3 % — ABNORMAL LOW (ref 39.0–52.0)
MCHC: 29.2 g/dL — ABNORMAL LOW (ref 30.0–36.0)
MCV: 95.7 fL (ref 78.0–100.0)
Platelets: 104 10*3/uL — ABNORMAL LOW (ref 150–400)
RDW: 14.7 % (ref 11.5–15.5)
WBC: 13.3 10*3/uL — ABNORMAL HIGH (ref 4.0–10.5)

## 2012-05-14 LAB — VITAMIN B12: Vitamin B-12: 394 pg/mL (ref 211–911)

## 2012-05-14 LAB — T4, FREE: Free T4: 1.06 ng/dL (ref 0.80–1.80)

## 2012-05-14 MED ORDER — FUROSEMIDE 40 MG PO TABS
40.0000 mg | ORAL_TABLET | Freq: Two times a day (BID) | ORAL | Status: DC
  Administered 2012-05-15 – 2012-05-16 (×3): 40 mg via ORAL
  Filled 2012-05-14 (×5): qty 1

## 2012-05-14 NOTE — Progress Notes (Signed)
RT received call from RN regarding patient's noncompliance with bipap.  Pt refused to continue to wear bipap, therefore pt placed back on [place] by RN.

## 2012-05-14 NOTE — Progress Notes (Signed)
Nutrition Brief Note  Patient identified on the Malnutrition Screening Tool (MST) Report  Body mass index is 31.91 kg/(m^2). Patient meets criteria for obesity grade 1 based on current BMI.   Wt Readings from Last 10 Encounters:  05/12/12 248 lb 10.9 oz (112.8 kg)  04/25/12 246 lb (111.585 kg)  09/10/11 251 lb (113.853 kg)  08/30/11 258 lb (117.028 kg)  06/04/11 251 lb (113.853 kg)  05/27/11 259 lb (117.482 kg)  05/04/11 257 lb 9.6 oz (116.847 kg)  04/27/11 253 lb 1.8 oz (114.81 kg)  03/09/11 266 lb (120.657 kg)  11/04/10 252 lb (114.306 kg)   Weight has decreased but reports very good intake at home.  Does not use added salt.  Current diet order is low sodium, patient is consuming approximately 100% of meals at this time. Labs and medications reviewed.   No nutrition interventions warranted at this time. If nutrition issues arise, please consult RD.   Oran Rein, RD, LDN Clinical Inpatient Dietitian Pager:  [phone] Weekend and after hours pager:  [phone]

## 2012-05-14 NOTE — Progress Notes (Signed)
Clinical Social Work Department CLINICAL SOCIAL WORK PLACEMENT NOTE 05/14/2012  Patient:  [name] [name], [name] [name]  Account Number:  [id number] Admit date:  05/12/2012  Clinical Social Worker:  Doroteo Glassman  Date/time:  05/14/2012 09:35 AM  Clinical Social Work is seeking post-discharge placement for this patient at the following level of care:   SKILLED NURSING   (*CSW will update this form in Epic as items are completed)   05/14/2012  Patient/family provided with [facility] Department of Clinical Social Work's list of facilities offering this level of care within the geographic area requested by the patient (or if unable, by the patient's family).  05/14/2012  Patient/family informed of their freedom to choose among providers that offer the needed level of care, that participate in Medicare, Medicaid or managed care program needed by the patient, have an available bed and are willing to accept the patient.  05/14/2012  Patient/family informed of [facility]' ownership interest in [facility], as well as of the fact that they are under no obligation to receive care at this facility.  PASARR submitted to EDS on 05/14/2012 PASARR number received from EDS on 05/14/2012  FL2 transmitted to all facilities in geographic area requested by pt/family on  05/14/2012 FL2 transmitted to all facilities within larger geographic area on   Patient informed that his/her managed care company has contracts with or will negotiate with  certain facilities, including the following:     Patient/family informed of bed offers received:   Patient chooses bed at  Physician recommends and patient chooses bed at    Patient to be transferred to  on   Patient to be transferred to facility by   The following physician request were entered in Epic:   Additional Comments:  CSW to continue to follow.  Providence Crosby, LCSWA Clinical Social Work [phone]

## 2012-05-14 NOTE — Progress Notes (Signed)
Placed pt on bipap for rest, autobilevel mode, maximum ipap=20cm h2o, minimum epap=5cm h2o, with 5l o2 bleedin.  RN aware.  Pt is tolerating well at this time.  HR63, rr20, sats100%.  Sterile water added to max fill line of humidity chamber.

## 2012-05-14 NOTE — Progress Notes (Signed)
Clinical Social Work Department BRIEF PSYCHOSOCIAL ASSESSMENT 05/14/2012  Patient:  [name] [name], [name] [name]     Account Number:  [id number]     Admit date:  05/12/2012  Clinical Social Worker:  Doroteo Glassman  Date/Time:  05/14/2012 09:29 AM  Referred by:  Physician  Date Referred:  05/14/2012 Referred for  SNF Placement   Other Referral:   Interview type:  Patient Other interview type:    PSYCHOSOCIAL DATA Living Status:  ALONE Admitted from facility:   Level of care:   Primary support name:  Blane Ohara Primary support relationship to patient:  SIBLING Degree of support available:   adequate    CURRENT CONCERNS Current Concerns  Post-Acute Placement   Other Concerns:    SOCIAL WORK ASSESSMENT / PLAN Met with Pt to discuss d/c plans.    CSW discussed with Pt MD's concerns about Pt living alone and his ability to care for himself.  Pt stated that he is aware of everyone's concerns and that he and his sister have discussed him going to an assisted living facility. He stated that he is open to this option, however he wants to know how much they will take from his disability check prior to going.    CSW discussed with Pt PT's recommendations for d/c from the hospital.  Pt is agreeable to SNF and gave CSW permission to begin the SNF search.  Pt understands that the SNF will assist him with d/c to an ALF.    Pt stated that he doesn't have [employer] but that he thinks his sister is working on the [employer] application for him. CSW provided Pt with the financial counselor's number, at Pt's request, so that, if need be, she can assist Pt/his sister with the [employer] application.    Pt made it clear that he's not sold on the ALF idea but he's willing to go to a SNF upon d/c.    CSW thanked Pt for his time.   Assessment/plan status:  Psychosocial Support/Ongoing Assessment of Needs Other assessment/ plan:   Information/referral to community resources:   [employer] SNFs     PATIENT'S/FAMILY'S RESPONSE TO PLAN OF CARE: Pt thanked CSW for time and assistance.   CSW to continue to follow.  Providence Crosby, LCSWA Clinical Social Work [phone]

## 2012-05-14 NOTE — Progress Notes (Signed)
PULMONARY  / CRITICAL CARE MEDICINE  Name: [name] [name] MRN: [id number] DOB: March 29, 1940    ADMISSION DATE:  05/12/2012 CONSULTATION DATE:  05/12/2012  REFERRING MD :  Dr. Benjamine Mola  CHIEF COMPLAINT:  Respiratory failure/altered mental status  BRIEF PATIENT DESCRIPTION:   72 yo male with morbid obesity, OHS & COPD with chronic resp failure followed by Dr. Sherene Sires. Admitted with recurrent respiratory failure and CO2 narcosis.   SIGNIFICANT EVENTS / STUDIES:  Bipap started 05/12/2012  LINES / TUBES: PIV x1 05/12/2012 Foley  CULTURES: Sputum cx - Pending  ANTIBIOTICS: Azithro start 05/12/2012 >> 3/15 Ceftriaxone start 05/12/12 >> 3/15  HISTORY OF PRESENT ILLNESS:   72 y/o male with multiple medical problems including morbid obesity, chronic hypercarbic respiratory failure due to OHS & COPD with ongoing tobacco use, chronic renal failure (baseline 1.7-1.9), OSA, DM2. Admitted earlier today due to recurrent respiratory failure.  Patient unable to provide much history. According to notes, family called EMS as patient was acting confused and abnormal at home, apparently there are reports, he climbed on top of a dresser. When EMS arrived, his O2 sat was 77% on RA- it is reported that he wears 5 L of O2. In the ER, his CO2 on BMP was > 45- ABG was done and CO2 was so high it would not record. Ph 7.280 paO2 88% on 6L.  Started on BIPAP. Now much more awake. Talking in full sentences. Denies CP or SOB. + cough productive of white sputum. No fevers. Possible chills. Still smoking 1pack per week.   CXR: No acute infiltrate or CHF. Baseline pCO2 on previous ABGs seems to be in 70s.   Repeat ABG 7.296/105/75/95%  Interval events:  Only wore BiPAP for 1-2h last night Mentation and overall status improved.  Net negative on current Po lasix with rising S cr  VITAL SIGNS: Temp:  [98.4 F (36.9 C)-99 F (37.2 C)] 98.4 F (36.9 C) (03/16 0400) Pulse Rate:  [66-87] 85 (03/16 1000) Resp:  [14-27] 19  (03/16 1000) BP: (120-172)/(31-77) 120/46 mmHg (03/16 1000) SpO2:  [82 %-99 %] 95 % (03/16 1000) FiO2 (%):  [40 %-50 %] 50 % (03/16 0600) HEMODYNAMICS:   VENTILATOR SETTINGS: Vent Mode:  [-]  FiO2 (%):  [40 %-50 %] 50 % INTAKE / OUTPUT: Intake/Output     03/15 0701 - 03/16 0700 03/16 0701 - 03/17 0700   P.O. 450    I.V. (mL/kg) 240 (2.1) 33 (0.3)   Other     IV Piggyback     Total Intake(mL/kg) 690 (6.1) 33 (0.3)   Urine (mL/kg/hr) 2625 (1) 425 (1.1)   Total Output 2625 425   Net -1935 -392          PHYSICAL EXAMINATION: General:  Obese male, NAD on venti mask Neuro:  Awake. Follows commands. Answers most questions. Oriented to person only. Confused about events on admission HEENT:  Normal except for poor dentition. Very thick neck. Unable to assess JVD. Carotids 2+ bilaterally without bruit Cardiovascular:  PMI non-palpable. RRR no obvious murmur Lungs:  Very poor air movement. No obvious wheeze Abdomen:  Obese. NT/ND . Good BS Extremities. No cyanosis. Tr edema.  Skin:  No rash  LABS  Recent Labs Lab 05/12/12 1300 05/13/12 0350 05/14/12 0332  HGB 11.5* 10.3* 10.3*  HCT 40.9 36.2* 35.3*  WBC 14.1* 7.9 13.3*  PLT 33* 61* 104*    Recent Labs Lab 05/12/12 1300 05/13/12 0350 05/14/12 0332  NA 144 146* 143  K 3.8  4.4 3.7  CL 89* 94* 91*  CO2 >45* >45* >45*  GLUCOSE 145* 160* 149*  BUN 23 26* 38*  CREATININE 1.68* 1.69* 1.93*  CALCIUM 10.1 9.6 9.5    Recent Labs Lab 05/12/12 1300 05/13/12 0350  AST 21 14  ALT 28 21  ALKPHOS 81 63  BILITOT 0.6 0.4  PROT 7.5 6.7  ALBUMIN 3.4* 3.1*    Recent Labs Lab 05/12/12 1430 05/12/12 1756 05/12/12 2012 05/13/12 0023 05/13/12 0819  PHART 7.280* 7.263* 7.296* 7.325* 7.433  PCO2ART PENDING 0* 105.0* 102.0* 77.3*  PO2ART 57.2* 58.9* 75.0* 85.3 65.5*  HCO3 PENDING O 49.4* 51.5* 50.7*  TCO2 PENDING PENDING 46.4 48.4 46.7  O2SAT 88.0 88.1 95.2 96.3 94.1     Recent Labs Lab 05/13/12 0747 05/13/12 1203  05/13/12 1558 05/13/12 2215 05/14/12 0759  GLUCAP 179* 149* 126* 176* 134*    CXR: COPD. No acute infiltrate or edema.  ASSESSMENT / PLAN:  PULMONARY A: Acute on chronic combined respiratory failure in setting of OHS/COPD with ongoing tobacco use. No wheeze on exam 3/15, 16 am. Notes indicate that he has some positive pressure device at home, Not clear is this is CPAP or BiPAP P:   - continue scheduled BD's - d/c'd steroids 3/15 given his confusion/delirium and in absence wheezing - BiPAP qhs mandatory - diamox x 3 days starting 3/15 to assist with re-setting his baseline CO2 and thereby pCO2; follow BMP and CO2 (goal CO2 ~ 35)  CARDIOVASCULAR A: Chronic diastolic HF P:  Very mild overload on exam. Weight actually at baseline. Single dose IV lasix given 3/14 Scheduled PO lasix continued 3/15 > decrease with rising S Cr and to avoid contraction effects on his CO2. He received lasix 120mg  PO 3/16 am; will hold pm dose and change to 40mg  po bid. Will need to be readjusted before d/c to home OK to restart bisoprolol 3/15  RENAL A:  Chronic renal failure P:   Adjust lasix dosing as above. ? Whether he is compliant with lasix at home given his rising S Cr on current dosing (home regimen)  GASTROINTESTINAL A:  H/o GERD P:   Continue PPI.   INFECTIOUS A:  No evidence CXR for CAP P:   - d/c'd ceftriaxone and azithro 3/15  HEME: A: Thrombocytopenia, improving P: - heparinoids on hold  ENDOCRINE A:  Diabetes Mellitus P:   Continue with insulin.   NEUROLOGIC A:  Acute delirium Due to CO2 narcosis + possible steroid-associated delirium P:   - Improving with BIPAP. Will continue to follow.  - d/c the steroids 3/15   I have personally obtained a history, examined the patient, evaluated laboratory and imaging results, formulated the assessment and plan and placed orders.  OK to change to SDU status  Levy Pupa, MD, PhD 05/14/2012, 10:39 AM [facility] [phone] or if no answer [phone]

## 2012-05-14 NOTE — Progress Notes (Addendum)
[facility] PROGRESS NOTE  [name] [name]:[id number] DOB: Sep 25, 1940 DOA: 05/12/2012 PCP: Carollee Herter, MD  Assessment/Plan: 1. Acute on chronic respiratory Failure: COPD exacerbation, severe underlying COPD, OSA/OHS -  PH improved, but PCO2 still very high and at this point mentating well - Suspect baseline CO2 is very high - repeat ABG yesterday am with PCO2 improved at 77 from 100s -did not use./tolerate BIPAP last pm, unfortunately compliance will be a difficult issue - currently mentating well and distress improved  - continue IV rocephin/zithromax, solumedrol stopped by Dr,.Byrum yesterday - albuterol and atrovent nebs  - PCCM following, followed by Dr.Wert in office  2. CHF: continue PO lasix at home dose, check 2D ECHO  3. DM: continue lantus, hold meal coverage, SSI  4. Tobacco abuse: counselled  5. 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 Suspect Dementia at baseline, long standing memory and functional decline per sister, will need formal eval for this once improved, check TSH, B12  7. Thrombocytopenia: chronic, monitor  8. OSA: non complaint with CPAP  9. Poor living condition/unable to care for self/medication non complaince: needs ALF at DC  Code Status: FULL Family Communication: d/w sister: [phone] Lynden Oxford) Disposition Plan: potentially Tx to floor today  Consultants:  PCCM   Antibiotics:  Rocephin  zithromax  HPI/Subjective: Did not wear BIPAP at all last night, was on Ventimask  Objective: Filed Vitals:   05/14/12 0339 05/14/12 0400 05/14/12 0500 05/14/12 0600  BP: 136/37 134/56 122/60 144/60  Pulse: 87 71 70 67  Temp:  98.4 F (36.9 C)    TempSrc:  Oral    Resp: 24 21 23 20   Height:      Weight:      SpO2: 95% 98% 98% 99%    Intake/Output Summary (Last 24 hours) at 05/14/12 0654 Last data filed at 05/14/12 0600  Gross per 24 hour  Intake     690 ml  Output   2625 ml  Net  -1935 ml   Filed Weights   05/12/12 2000  Weight: 112.8 kg (248 lb 10.9 oz)    Exam:   General:  AAo to self and place, partly to time  Cardiovascular: S1S2/RRR  Respiratory: poor air movement B/L  Abdomen: soft, distended, BS present  Ext: no edema c/c  Data Reviewed: Basic Metabolic Panel:  Recent Labs Lab 05/12/12 1300 05/13/12 0350 05/14/12 0332  NA 144 146* 143  K 3.8 4.4 3.7  CL 89* 94* 91*  CO2 >45* >45* >45*  GLUCOSE 145* 160* 149*  BUN 23 26* 38*  CREATININE 1.68* 1.69* 1.93*  CALCIUM 10.1 9.6 9.5   Liver Function Tests:  Recent Labs Lab 05/12/12 1300 05/13/12 0350  AST 21 14  ALT 28 21  ALKPHOS 81 63  BILITOT 0.6 0.4  PROT 7.5 6.7  ALBUMIN 3.4* 3.1*   No results found for this basename: LIPASE, AMYLASE,  in the last 168 hours No results found for this basename: AMMONIA,  in the last 168 hours CBC:  Recent Labs Lab 05/12/12 1300 05/13/12 0350 05/14/12 0332  WBC 14.1* 7.9 13.3*  NEUTROABS 10.5*  --   --   HGB 11.5* 10.3* 10.3*  HCT 40.9 36.2* 35.3*  MCV 101.0* 98.9 95.7  PLT 33* 61* 104*   Cardiac Enzymes:  Recent Labs Lab 05/12/12 1300  TROPONINI <0.30   BNP (last 3 results)  Recent Labs  05/12/12 1300  PROBNP 1198.0*  CBG:  Recent Labs Lab 05/13/12 0430 05/13/12 0747 05/13/12 1203 05/13/12 1558 05/13/12 2215  GLUCAP 151* 179* 149* 126* 176*    Recent Results (from the past 240 hour(s))  CULTURE, BLOOD (ROUTINE X 2)     Status: None   Collection Time    05/12/12  4:35 PM      Result Value Range Status   Specimen Description BLOOD LEFT HAND   Final   Special Requests BOTTLES DRAWN AEROBIC AND ANAEROBIC 5CC   Final   Culture  Setup Time 05/12/2012 23:32   Final   Culture     Final   Value:        BLOOD CULTURE RECEIVED NO GROWTH TO DATE CULTURE WILL BE HELD FOR 5 DAYS BEFORE ISSUING A FINAL NEGATIVE REPORT   Report Status PENDING   Incomplete  CULTURE, BLOOD (ROUTINE X 2)      Status: None   Collection Time    05/12/12  4:45 PM      Result Value Range Status   Specimen Description BLOOD LEFT ARM   Final   Special Requests BOTTLES DRAWN AEROBIC AND ANAEROBIC 4CC   Final   Culture  Setup Time 05/12/2012 23:32   Final   Culture     Final   Value:        BLOOD CULTURE RECEIVED NO GROWTH TO DATE CULTURE WILL BE HELD FOR 5 DAYS BEFORE ISSUING A FINAL NEGATIVE REPORT   Report Status PENDING   Incomplete  MRSA PCR SCREENING     Status: None   Collection Time    05/12/12  5:55 PM      Result Value Range Status   MRSA by PCR NEGATIVE  NEGATIVE Final   Comment:            The GeneXpert MRSA Assay (FDA     approved for NASAL specimens     only), is one component of a     comprehensive MRSA colonization     surveillance program. It is not     intended to diagnose MRSA     infection nor to guide or     monitor treatment for     MRSA infections.     Studies: Dg Chest 2 View  05/12/2012  *RADIOLOGY REPORT*  Clinical Data: Shortness of breath, cough  CHEST - 2 VIEW  Comparison: 02/16/2012  Findings: Chronic interstitial markings.  Linear scarring in the right mid lung.  No focal consolidation. No pleural effusion or pneumothorax.  The heart is normal in size.  Degenerative changes of the visualized thoracolumbar spine.  IMPRESSION: No evidence of acute cardiopulmonary disease.   Original Report Authenticated By: Charline Bills, M.D.    Ct Head Wo Contrast  05/12/2012  *RADIOLOGY REPORT*  Clinical Data: Altered mental status, confusion, multiple falls  CT HEAD WITHOUT CONTRAST  Technique:  Contiguous axial images were obtained from the base of the skull through the vertex without contrast.  Comparison: None.  Findings: Motion degraded images.  No evidence of acute intracranial abnormality.  Intracranial atherosclerosis.  Cerebral volume is age appropriate.  No ventriculomegaly.  Partial opacification of the bilateral ethmoid sinuses.  The right mastoid air cells are  partially opacified (series 3/image 8).  No evidence of calvarial fracture.  IMPRESSION: Motion degraded images.  No evidence of acute intracranial abnormality.   Original Report Authenticated By: Charline Bills, M.D.    Dg Chest Port 1 View  05/13/2012  *RADIOLOGY REPORT*  Clinical Data: Increase shortness  of breath.  PORTABLE CHEST - 1 VIEW  Comparison: One-view chest 05/12/2012  Findings: The heart size is normal.  Pulmonary vascular congestion is slightly increased.  Minimal bibasilar atelectasis is similar to the prior exam.  IMPRESSION:  1.  increase in mild pulmonary vascular congestion. 2.  Mild bibasilar atelectasis.   Original Report Authenticated By: Marin Roberts, M.D.     Scheduled Meds: . acetaZOLAMIDE  250 mg Intravenous Q12H  . albuterol  2.5 mg Nebulization Q6H  . antiseptic oral rinse  15 mL Mouth Rinse q12n4p  . calcitRIOL  0.25 mcg Oral Daily  . chlorhexidine  15 mL Mouth Rinse BID  . furosemide  120 mg Oral Q breakfast  . furosemide  80 mg Oral q1800  . insulin aspart  0-15 Units Subcutaneous TID WC  . insulin aspart  0-5 Units Subcutaneous QHS  . insulin glargine  10 Units Subcutaneous BID  . ipratropium  0.5 mg Nebulization Q6H  . pantoprazole  40 mg Oral Daily  . paricalcitol  2 mcg Oral Q48H  . simvastatin  40 mg Oral Q48H  . sodium chloride  3 mL Intravenous Q12H   Continuous Infusions:   Principal Problem:   CO2 retention Active Problems:   DIABETES MELLITUS, TYPE II   Morbid obesity   TOBACCO USER   OSA (obstructive sleep apnea)   Chronic renal failure   Leukocytosis   Encephalopathy acute   Thrombocytopenia    Time spent:    [facility]  [facility] Pager [phone]. If 7PM-7AM, please contact night-coverage at [email or web address], password [facility] 05/14/2012, 6:54 AM  LOS: 2 days

## 2012-05-14 NOTE — Progress Notes (Signed)
Pt seen, pt appears to be resting comfortable, no distress noted.  Pt remains on 50% vm, hr 87, rr24, sats 95%.  Bipap remains at bedside on standby.  RT will continue to monitor pt.

## 2012-05-14 NOTE — Progress Notes (Signed)
  Echocardiogram 2D Echocardiogram has been performed.  [name] [name] 05/14/2012, 10:03 AM

## 2012-05-15 ENCOUNTER — Telehealth: Payer: Self-pay | Admitting: Internal Medicine

## 2012-05-15 DIAGNOSIS — I279 Pulmonary heart disease, unspecified: Secondary | ICD-10-CM

## 2012-05-15 DIAGNOSIS — E872 Acidosis: Secondary | ICD-10-CM

## 2012-05-15 LAB — GLUCOSE, CAPILLARY
Glucose-Capillary: 133 mg/dL — ABNORMAL HIGH (ref 70–99)
Glucose-Capillary: 192 mg/dL — ABNORMAL HIGH (ref 70–99)

## 2012-05-15 LAB — BLOOD GAS, ARTERIAL
Acid-Base Excess: 0 mmol/L (ref 0.0–2.0)
Acid-Base Excess: 0 mmol/L (ref 0.0–2.0)
Acid-base deficit: 0 mmol/L (ref 0.0–2.0)
Delivery systems: POSITIVE
FIO2: 0.5 %
Mode: POSITIVE
O2 Content: 6 L/min
O2 Saturation: 88 %
O2 Saturation: 88.1 %
Patient temperature: 98.6
Patient temperature: 98.6
TCO2: 0 mmol/L (ref 0–100)
TCO2: 0 mmol/L (ref 0–100)
pCO2 arterial: 0 mmHg — CL (ref 35.0–45.0)
pH, Arterial: 7.28 — ABNORMAL LOW (ref 7.350–7.450)

## 2012-05-15 LAB — CBC
HCT: 40.5 % (ref 39.0–52.0)
Hemoglobin: 11.8 g/dL — ABNORMAL LOW (ref 13.0–17.0)
MCHC: 29.1 g/dL — ABNORMAL LOW (ref 30.0–36.0)
MCV: 96.7 fL (ref 78.0–100.0)
RDW: 14.9 % (ref 11.5–15.5)
WBC: 12.6 10*3/uL — ABNORMAL HIGH (ref 4.0–10.5)

## 2012-05-15 LAB — BASIC METABOLIC PANEL
BUN: 36 mg/dL — ABNORMAL HIGH (ref 6–23)
Chloride: 93 mEq/L — ABNORMAL LOW (ref 96–112)
Creatinine, Ser: 1.9 mg/dL — ABNORMAL HIGH (ref 0.50–1.35)
GFR calc Af Amer: 38 mL/min — ABNORMAL LOW (ref >90)
Glucose, Bld: 167 mg/dL — ABNORMAL HIGH (ref 70–99)

## 2012-05-15 NOTE — Telephone Encounter (Signed)
Called [facility] pt emergency contact and pt sister.  She states Medicaid app still in process.  Pt may have to pay $50.00 per day copy in snf or assisted living until [facility] kicks in.  Advised sister that Dr. [name] advised it would not do any good for daughter to come in now regarding Nursing Home as pt in hosp and may be placed in SNF for a long time.  [name] will advise pt dtr.

## 2012-05-15 NOTE — Telephone Encounter (Signed)
Explain that he will be in skilled nursing for a long time and therefore this time there is nothing that needs to be done. Perhaps later her help might be needed.

## 2012-05-15 NOTE — Telephone Encounter (Signed)
Pt daughter called from [place] and would like a letter for leave from [place] so she can come home. Her husband is stationed there. She wants to come home to take care of her dad and get him in a nursing home cause he is not going to be able to take care of himself. She needs a letter so she can turn into the [facility].   She will call back with a fax number so we can fax it over to her

## 2012-05-15 NOTE — Progress Notes (Signed)
CSW met with patient to discuss bed offers. Patient is alert and oriented X3. CSW offered choices and patient asked about cost. CSW explained copay for united healthcare medicare. Patient states that he will not go to rehab if it costs him money. CSW explained risks and benefits. Patient understands same and continues to refuse.  Shalom Mcguiness C. Angelize Ryce MSW, LCSW [phone]

## 2012-05-15 NOTE — Progress Notes (Signed)
Clinical Social Work  CSW met with patient and sister [name]) at bedside. Patient agreeable to sister involvement during assessment. Sister has questions regarding SNF and copays from insurance. CSW explained copays and that Medicare will not cover LT stay. Sister reports that patient is Medicaid Pending because he submitted his application on 05/12/12. CSW explained since Medicaid is not approved then he would still have copays to go to SNF. Patient against SNF placement and reports he does not desire to pay copays. Sister inquired about LT stay at SNF or ALF. CSW explained the need for LT Medicaid and that monthly income would be used to supplement the cost of placement. Patient reports he is not agreeable to this plan. Sister had additional questions regarding Medicaid and payment that CSW deferred to the Department of Social Services. Sister reports that she and patient will speak with DSS and discuss plans with patient. Sister asked for CSW to follow up tomorrow regarding patient's decision.  Unk Lightning, LCSW (Coverage for [employer])

## 2012-05-15 NOTE — Telephone Encounter (Signed)
Check with [facility]. I appreciate her that the nurse was here with him the other day and they're working on getting him into an assisted living facility. That's the case and the daughter does not necessarily need to come back from [place]

## 2012-05-15 NOTE — Progress Notes (Signed)
Patient is currently active with long-term disease management services with [facility] Care Management Program. Spoke with [facility] Care Coordinator who follows patient at home. [facility] LCSW has been working with patient at home regarding SNF/ALF placement. Appears patient is now refusing SNF placement while in the hospital per inpatient LCSW notes. Awaiting to find out from [facility] LCSW (follows in community) to confirm the status of patient's Medicaid application. Will the Medicaid application make a difference in copay payment for patient going to SNF??? Appears patient really needs SNF placement according to the [facility] Care Coordinator who follows patient in the community. Will continue to follow and collaborate with inpatient staff regarding discharge plans. Spoke with inpatient RNCM to make aware [facility] Care Management active and that this writer is awaiting information on the status of patient's Medicaid application.   Raiford Noble, MSN- Ed, RN,BSN, [facility], [phone]

## 2012-05-15 NOTE — Telephone Encounter (Signed)
I called [name] [name] at [facility] [phone] she states the pt is currently in patient at [facility] and that our patient will be going to a Skilled Nursing Facility and then assisted living.  Pt sister was working on the application with medicaid when pt was put in hospital for respitory failure, so the application has not been finished.  [name] [name] thinks pt will be in SNF for a long time.  Please advise what to tell dtr.

## 2012-05-15 NOTE — Care Management Note (Addendum)
    Page 1 of 2   05/16/2012     2:47:31 PM   CARE MANAGEMENT NOTE 05/16/2012  Patient:  [name] [name], [name] [name]   Account Number:  [id number]  Date Initiated:  05/15/2012  Documentation initiated by:  Lanier Clam  Subjective/Objective Assessment:   ADMITTED W/AMS.HX:C02 RETENTION,HYPOXIA.     Action/Plan:   FROM HOME,ALONE.HAS SUPPORT.HAS CANE,02-APRIA,HAS TRAVEL TANK.HAS PCP,PHARMACY.   Anticipated DC Date:  05/16/2012   Anticipated DC Plan:  SKILLED NURSING FACILITY      DC Planning Services  CM consult      Choice offered to / List presented to:  C-1 Patient           Status of service:  Completed, signed off Medicare Important Message given?   (If response is "NO", the following Medicare IM given date fields will be blank) Date Medicare IM given:   Date Additional Medicare IM given:    Discharge Disposition:  SKILLED NURSING FACILITY  Per UR Regulation:  Reviewed for med. necessity/level of care/duration of stay  If discussed at Long Length of Stay Meetings, dates discussed:    Comments:  05/16/12 Alizae Bechtel RN,BSN NCM [phone] NOTED PATIENT AGREES TO SNF. SPOKE TO PATIENT AGAIN ABOUT D/C PLAN.HE STATES "IF HE HAS TO PAY ANY MONEY TO GO TO A REHAB FACILITY HE DOES NOT WANT TO GO".PER PATIENT REQUEST ASKED TO CALL HIS SISTER TERRY TEL#[phone]-SHE WOULD LIKE T MEET W/SW TO DISCUSS CURRENT INFO THAT SHE HAS RECEIVED FROM THE [facility] SW.SISTER WILL BE @ HOSPITAL THIS AFTERNOON.SW-BETHANY WILL MEET W/THEM.ALSO SPOKE TO INSURANCE CO.ASKING ABOUT D/C PLAN SINCE MEDICALLY STABLE FOR D/C.[facility] FOLLOWING ALSO.IF D/C PLAN HOME W/HH-[facility] ALREADY FOLLOWING-RECOMMEND HHRN/PT/NURSE'S AIDE.MD UPDATED.  05/15/12 Rubby Barbary RN,BSN NCM [phone] PT-SNF.PATIENT PLEASANTLY DECLINES SNF.PREFER HHC.[facility] CHOSEN.TC SUSAN(REP) INFORMED OF REFERRAL, & FOLLOWING FOR D/C ORDERS.ALSO PROVIDED W/PRIVATE DUTY CARE AGENCY LIST AS RESOURCE-INFORMED THAT THIS IS AN OUT OF POCKET EXPENSE.PATIENT/SISTER  COMPREHENDED.

## 2012-05-15 NOTE — Progress Notes (Addendum)
PULMONARY  / CRITICAL CARE MEDICINE  Name: [name] [name] MRN: [id number] DOB: 1940/04/11    ADMISSION DATE:  05/12/2012 CONSULTATION DATE:  05/12/2012  REFERRING MD :  Dr. Benjamine [name]  CHIEF COMPLAINT:  Respiratory failure/altered mental status  BRIEF PATIENT DESCRIPTION:   72 yo male with morbid obesity, OHS & COPD with chronic resp failure followed by Dr. Sherene Sires. Admitted with recurrent respiratory failure and CO2 narcosis.   SIGNIFICANT EVENTS / STUDIES:  Bipap started 05/12/2012  LINES / TUBES: PIV x1 05/12/2012 Foley  CULTURES: Sputum cx - Pending  ANTIBIOTICS: Azithro start 05/12/2012 >> 3/15 Ceftriaxone start 05/12/12 >> 3/15  HISTORY OF PRESENT ILLNESS:   72 y/o male with multiple medical problems including morbid obesity, chronic hypercarbic respiratory failure due to OHS & COPD with ongoing tobacco use, chronic renal failure (baseline 1.7-1.9), OSA, DM2. Admitted earlier today due to recurrent respiratory failure.  Patient unable to provide much history. According to notes, family called EMS as patient was acting confused and abnormal at home, apparently there are reports, he climbed on top of a dresser. When EMS arrived, his O2 sat was 77% on RA- it is reported that he wears 5 L of O2. In the ER, his CO2 on BMP was > 45- ABG was done and CO2 was so high it would not record. Ph 7.280 paO2 88% on 6L.  Started on BIPAP. Now much more awake. Talking in full sentences. Denies CP or SOB. + cough productive of white sputum. No fevers. Possible chills. Still smoking 1pack per week.   CXR: No acute infiltrate or CHF. Baseline pCO2 on previous ABGs seems to be in 70s.   Repeat ABG 7.296/105/75/95%  Interval events:  Wore BiPAP well last night, tolerated all night Mentation and overall status improved.  Another 2L net negative last 24 h  VITAL SIGNS: Temp:  [98.5 F (36.9 C)] 98.5 F (36.9 C) (03/17 0400) Pulse Rate:  [58-85] 72 (03/17 0700) Resp:  [14-25] 14 (03/17 0700) BP:  (112-146)/(36-65) 135/56 mmHg (03/17 0600) SpO2:  [87 %-100 %] 99 % (03/17 0700) Weight:  [109.9 kg (242 lb 4.6 oz)] 109.9 kg (242 lb 4.6 oz) (03/17 0400) HEMODYNAMICS:   VENTILATOR SETTINGS:   INTAKE / OUTPUT: Intake/Output     03/16 0701 - 03/17 0700 03/17 0701 - 03/18 0700   P.O.     I.V. (mL/kg) 43 (0.4)    Total Intake(mL/kg) 43 (0.4)    Urine (mL/kg/hr) 2025 (0.8)    Total Output 2025     Net -1982          Urine Occurrence 1 x    Stool Occurrence 1 x      PHYSICAL EXAMINATION: General:  Obese male, NAD on [place] O2 Neuro:  Awake. Follows commands. Answers all questions. Oriented x 3 HEENT:  Normal except for poor dentition. Very thick neck. Unable to assess JVD. Carotids 2+ bilaterally without bruit Cardiovascular:  PMI non-palpable. RRR no obvious murmur Lungs:  Improved air movement, no wheezing Abdomen:  Obese. NT/ND . Good BS Extremities. No cyanosis. Tr edema.  Skin:  No rash  LABS  Recent Labs Lab 05/13/12 0350 05/14/12 0332 05/15/12 0338  HGB 10.3* 10.3* 11.8*  HCT 36.2* 35.3* 40.5  WBC 7.9 13.3* 12.6*  PLT 61* 104* 109*    Recent Labs Lab 05/12/12 1300 05/13/12 0350 05/14/12 0332 05/15/12 0338  NA 144 146* 143 142  K 3.8 4.4 3.7 4.0  CL 89* 94* 91* 93*  CO2 >45* >45* >  45* >45*  GLUCOSE 145* 160* 149* 167*  BUN 23 26* 38* 36*  CREATININE 1.68* 1.69* 1.93* 1.90*  CALCIUM 10.1 9.6 9.5 9.7    Recent Labs Lab 05/12/12 1300 05/13/12 0350  AST 21 14  ALT 28 21  ALKPHOS 81 63  BILITOT 0.6 0.4  PROT 7.5 6.7  ALBUMIN 3.4* 3.1*    Recent Labs Lab 05/12/12 1430 05/12/12 1756 05/12/12 2012 05/13/12 0023 05/13/12 0819  PHART 7.280* 7.263* 7.296* 7.325* 7.433  PCO2ART PENDING 0* 105.0* 102.0* 77.3*  PO2ART 57.2* 58.9* 75.0* 85.3 65.5*  HCO3 PENDING O 49.4* 51.5* 50.7*  TCO2 PENDING PENDING 46.4 48.4 46.7  O2SAT 88.0 88.1 95.2 96.3 94.1     Recent Labs Lab 05/14/12 0759 05/14/12 1129 05/14/12 1525 05/14/12 2013 05/15/12 0752   GLUCAP 134* 229* 155* 176* 133*    CXR: COPD. No acute infiltrate or edema.  ASSESSMENT / PLAN:  PULMONARY A: Acute on chronic combined respiratory failure in setting of OHS/COPD with ongoing tobacco use. No wheeze on exam since 3/15 am. Notes indicate that he has CPAP or BiPAP at home, he isn't sure which P:   - continue scheduled BD's; looks like his home regimen is DuoNebs qid + Pulmicort nebs bid - d/c'd steroids 3/15 given his confusion/delirium and in absence wheezing - BiPAP qhs mandatory - diamox x 3 days starting 3/15 to assist with re-setting his baseline CO2 and thereby pCO2; follow BMP and CO2 (goal CO2 < 40). As of 3/17 am his CO2 remains > 45, probably due to aggressive diuresis. Could consider extending the diamox treatment 1-2 days in effort to drive the CO2 down.   CARDIOVASCULAR A: Chronic diastolic HF P:  Very mild overload on exam. Weight actually at baseline. Single dose IV lasix given 3/14 Scheduled PO lasix continued 3/15 > decrease with rising S Cr and to avoid contraction effects on his CO2. He received lasix 120mg  PO 3/16 am; Changed to 40mg  po bid on 3/16. Consider holding for a day to recalibrate his CO2. I will leave this to [facility] to manage. Lasix will need to be readjusted for final appropriate dose before d/c to home OK to restart bisoprolol 3/15  RENAL A:  Chronic renal failure P:   Adjust lasix dosing as above. ? Whether he is compliant with lasix at home given his rising S Cr on his home regimen  GASTROINTESTINAL A:  H/o GERD P:   Continue PPI.   INFECTIOUS A:  No evidence CXR for CAP P:   - d/c'd ceftriaxone and azithro 3/15  HEME: A: Thrombocytopenia, improving P: - heparinoids on hold  ENDOCRINE A:  Diabetes Mellitus P:   Continue with insulin.   NEUROLOGIC A:  Acute delirium Due to CO2 narcosis + possible steroid-associated delirium P:   - Improving with BIPAP. Will continue to follow.  - d/c'd the steroids 3/15  OK to move  to floor with mandatory NIPPV qhs. PCCM will sign off. Will need outpt f/u with Drs Shelle Iron and Sherene Sires - appointments made already. Please call if we can help you.   I have personally obtained a history, examined the patient, evaluated laboratory and imaging results, formulated the assessment and plan and placed orders.    Levy Pupa, MD, PhD 05/15/2012, 8:59 AM [facility] [phone] or if no answer [phone]

## 2012-05-15 NOTE — Clinical Social Work Note (Signed)
CSW attempted to give bed offers, but Pt transferred to floor before ICU CSW could see. CSW will give report to Becky Sax, who will follow up on bed offers and plan for SNF.  Doreen Salvage, LCSW ICU/Stepdown Clinical Social Worker [facility] Cell [phone] Hours 8am-1200pm M-F

## 2012-05-15 NOTE — Progress Notes (Signed)
Patient remained on BiPAP auto for just a couple of hours and requested to be taken off. At this time, he refuses any further positive pressure. RN aware.

## 2012-05-15 NOTE — Progress Notes (Signed)
Physical Therapy Treatment Patient Details Name: [name] [name] MRN: [id number] DOB: [birth year] Today's Date: 05/15/2012 Time: 4132-4401 PT Time Calculation (min): 19 min  PT Assessment / Plan / Recommendation Comments on Treatment Session  Pt progressing well with mobility and requires less oxygen than when seen in ICU.  Continue to recommend ST SNF for increased safety.     Follow Up Recommendations  SNF;Supervision/Assistance - 24 hour     Does the patient have the potential to tolerate intense rehabilitation     Barriers to Discharge        Equipment Recommendations  Rolling walker with 5" wheels    Recommendations for Other Services    Frequency Min 3X/week   Plan Discharge plan remains appropriate    Precautions / Restrictions Precautions Precautions: Fall Precaution Comments: 3L O2   Pertinent Vitals/Pain 4-5/10 pain     Mobility  Bed Mobility Bed Mobility: Supine to Sit;Sitting - Scoot to Edge of Bed Supine to Sit: 4: Min assist;HOB elevated Details for Bed Mobility Assistance: Assist for trunk to attain sitting position with cues for technique and attending to task.  Transfers Transfers: Sit to Stand;Stand to [employer] Transfers Sit to Stand: 4: Min assist;With upper extremity assist;From bed Stand to Sit: 4: Min assist;With armrests;To chair/3-in-1 Details for Transfer Assistance: Max cues for hand placement and safety as pt stood up by pulling on RW which was not close enough to him.  Ambulation/Gait Ambulation/Gait Assistance: 4: Min assist Ambulation Distance (Feet): 200 Feet Assistive device: Rolling walker Ambulation/Gait Assistance Details: cues for maintaining position inside of RW, esp when turning.  Also cues for pursed lip breathing due to noted SOB during gait.  Ambulated on 3L O2 with SaO2 at 94% during amb.  Gait Pattern: Step-through pattern;Decreased stride length;Trunk flexed Gait velocity: decreased Stairs: No    Exercises     PT  Diagnosis:    PT Problem List:   PT Treatment Interventions:     PT Goals Acute Rehab PT Goals PT Goal Formulation: With patient Time For Goal Achievement: 05/27/12 Potential to Achieve Goals: Good Pt will go Supine/Side to Sit: with supervision PT Goal: Supine/Side to Sit - Progress: Progressing toward goal Pt will go Sit to Stand: with supervision PT Goal: Sit to Stand - Progress: Progressing toward goal Pt will go Stand to Sit: with supervision PT Goal: Stand to Sit - Progress: Progressing toward goal Pt will Ambulate: 51 - 150 feet;with supervision;with least restrictive assistive device PT Goal: Ambulate - Progress: Updated due to goal met  Visit Information  Last PT Received On: 05/15/12 Assistance Needed: +2    Subjective Data  Subjective: I don't remember seeing you on Saturday.  Patient Stated Goal: to return home.    Cognition  Cognition Overall Cognitive Status: Appears within functional limits for tasks assessed/performed Arousal/Alertness: Awake/alert Orientation Level: Appears intact for tasks assessed Behavior During Session: [facility] for tasks performed Cognition - Other Comments: Pt more cognitively intact today.     Balance     End of Session PT - End of Session Activity Tolerance: Patient tolerated treatment well Patient left: in chair;with call bell/phone within reach;with nursing in room Nurse Communication: Mobility status   GP     Vista Deck 05/15/2012, 12:32 PM

## 2012-05-15 NOTE — Progress Notes (Signed)
[facility] PROGRESS NOTE  [name] [name] ZOX:[id number] DOB: 05/01/1940 DOA: 05/12/2012 PCP: Carollee Herter, MD  Assessment/Plan: 1. Acute on chronic respiratory Failure: COPD exacerbation, severe underlying COPD, OSA/OHS -  PH improved, but PCO2 still very high and at this point mentating well - Suspect baseline CO2 is very high - repeat ABG 3/15 am with PCO2 improved at 77 from 100s - finally used BIPAP last pm, unfortunately compliance will be a difficult issue - currently mentating well and distress improved  - IV rocephin/zithromax and solumedrol stopped by Dr,.Byrum - albuterol and atrovent nebs  - PCCM following, followed by Dr.Wert in office  2. CHF: continue PO lasix, decrease dose to 40bid,  2D ECHO WITH ef OF 55%  And grade 2 diastolic dysfunction  3. DM: continue lantus, hold meal coverage, SSI  4. Tobacco abuse: counselled  5. 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 Suspect Dementia at baseline, long standing memory and functional decline per sister, will need formal eval for this once improved,  TSH and B12 within normal limits  7. Thrombocytopenia: chronic, monitor  8. OSA: non complaint with CPAP  9. Poor living condition/unable to care for self/medication non complaince: needs ALF at DC  DVT proph: SCDs  Code Status: FULL Family Communication: d/w sister: [phone] Lynden Oxford) on 3/16 Disposition Plan: Tx to tele today  Consultants:  PCCM   Antibiotics:  Rocephin  zithromax  HPI/Subjective: Feels well, wore BIPAP last pm  Objective: Filed Vitals:   05/15/12 0500 05/15/12 0600 05/15/12 0700 05/15/12 0800  BP:  135/56  133/75  Pulse:  64 72 70  Temp:    98.3 F (36.8 C)  TempSrc:    Oral  Resp: 22 15 14 17   Height:      Weight:      SpO2:  100% 99% 97%    Intake/Output Summary (Last 24 hours) at 05/15/12 1045 Last data filed at 05/15/12 0800  Gross per 24 hour  Intake     110 ml  Output   1600 ml  Net  -1490 ml   Filed Weights   05/12/12 2000 05/15/12 0400  Weight: 112.8 kg (248 lb 10.9 oz) 109.9 kg (242 lb 4.6 oz)    Exam:   General:  AAo to self and place, partly to time  Cardiovascular: S1S2/RRR  Respiratory: poor air movement B/L  Abdomen: soft, distended, BS present  Ext: no edema c/c  Data Reviewed: Basic Metabolic Panel:  Recent Labs Lab 05/12/12 1300 05/13/12 0350 05/14/12 0332 05/15/12 0338  NA 144 146* 143 142  K 3.8 4.4 3.7 4.0  CL 89* 94* 91* 93*  CO2 >45* >45* >45* >45*  GLUCOSE 145* 160* 149* 167*  BUN 23 26* 38* 36*  CREATININE 1.68* 1.69* 1.93* 1.90*  CALCIUM 10.1 9.6 9.5 9.7   Liver Function Tests:  Recent Labs Lab 05/12/12 1300 05/13/12 0350  AST 21 14  ALT 28 21  ALKPHOS 81 63  BILITOT 0.6 0.4  PROT 7.5 6.7  ALBUMIN 3.4* 3.1*   No results found for this basename: LIPASE, AMYLASE,  in the last 168 hours No results found for this basename: AMMONIA,  in the last 168 hours CBC:  Recent Labs Lab 05/12/12 1300 05/13/12 0350 05/14/12 0332 05/15/12 0338  WBC 14.1* 7.9 13.3* 12.6*  NEUTROABS 10.5*  --   --   --   HGB 11.5* 10.3* 10.3* 11.8*  HCT 40.9 36.2* 35.3* 40.5  MCV  101.0* 98.9 95.7 96.7  PLT 33* 61* 104* 109*   Cardiac Enzymes:  Recent Labs Lab 05/12/12 1300  TROPONINI <0.30   BNP (last 3 results)  Recent Labs  05/12/12 1300  PROBNP 1198.0*   CBG:  Recent Labs Lab 05/14/12 0759 05/14/12 1129 05/14/12 1525 05/14/12 2013 05/15/12 0752  GLUCAP 134* 229* 155* 176* 133*    Recent Results (from the past 240 hour(s))  CULTURE, BLOOD (ROUTINE X 2)     Status: None   Collection Time    05/12/12  4:35 PM      Result Value Range Status   Specimen Description BLOOD LEFT HAND   Final   Special Requests BOTTLES DRAWN AEROBIC AND ANAEROBIC 5CC   Final   Culture  Setup Time 05/12/2012 23:32   Final   Culture     Final   Value:        BLOOD CULTURE RECEIVED NO GROWTH TO DATE  CULTURE WILL BE HELD FOR 5 DAYS BEFORE ISSUING A FINAL NEGATIVE REPORT   Report Status PENDING   Incomplete  CULTURE, BLOOD (ROUTINE X 2)     Status: None   Collection Time    05/12/12  4:45 PM      Result Value Range Status   Specimen Description BLOOD LEFT ARM   Final   Special Requests BOTTLES DRAWN AEROBIC AND ANAEROBIC 4CC   Final   Culture  Setup Time 05/12/2012 23:32   Final   Culture     Final   Value:        BLOOD CULTURE RECEIVED NO GROWTH TO DATE CULTURE WILL BE HELD FOR 5 DAYS BEFORE ISSUING A FINAL NEGATIVE REPORT   Report Status PENDING   Incomplete  MRSA PCR SCREENING     Status: None   Collection Time    05/12/12  5:55 PM      Result Value Range Status   MRSA by PCR NEGATIVE  NEGATIVE Final   Comment:            The GeneXpert MRSA Assay (FDA     approved for NASAL specimens     only), is one component of a     comprehensive MRSA colonization     surveillance program. It is not     intended to diagnose MRSA     infection nor to guide or     monitor treatment for     MRSA infections.     Studies: No results found.  Scheduled Meds: . acetaZOLAMIDE  250 mg Intravenous Q12H  . albuterol  2.5 mg Nebulization Q6H  . antiseptic oral rinse  15 mL Mouth Rinse q12n4p  . calcitRIOL  0.25 mcg Oral Daily  . chlorhexidine  15 mL Mouth Rinse BID  . furosemide  40 mg Oral BID  . insulin aspart  0-15 Units Subcutaneous TID WC  . insulin aspart  0-5 Units Subcutaneous QHS  . insulin glargine  10 Units Subcutaneous BID  . ipratropium  0.5 mg Nebulization Q6H  . pantoprazole  40 mg Oral Daily  . paricalcitol  2 mcg Oral Q48H  . simvastatin  40 mg Oral Q48H  . sodium chloride  3 mL Intravenous Q12H   Continuous Infusions:   Principal Problem:   CO2 retention Active Problems:   DIABETES MELLITUS, TYPE II   Morbid obesity   TOBACCO USER   OSA (obstructive sleep apnea)   Chronic renal failure   Leukocytosis   Encephalopathy acute   Thrombocytopenia  Time  spent:    [facility]  [facility] Pager [phone]. If 7PM-7AM, please contact night-coverage at [email or web address], password [facility] 05/15/2012, 10:45 AM  LOS: 3 days

## 2012-05-15 NOTE — Telephone Encounter (Signed)
Fax # to [place] [phone]. If that does not work please fax it to her sister at [phone] and she will fax it to her.

## 2012-05-15 NOTE — Evaluation (Signed)
Occupational Therapy Evaluation Patient Details Name: [name] [name] MRN: [id number] DOB: Jul 07, 1940 Today's Date: 05/15/2012 Time: 1478-2956 OT Time Calculation (min): 20 min  OT Assessment / Plan / Recommendation Clinical Impression  Pt presents to OT with decreased I with ADL activity. Pt will benefit from skilled to increase Iwith ADL activity adn return to [facility]    OT Assessment  Patient needs continued OT Services    Follow Up Recommendations  SNF             Frequency  Min 2X/week    Precautions / Restrictions Precautions Precautions: Fall Precaution Comments: 3L O2 Restrictions Weight Bearing Restrictions: No       ADL  Grooming: Simulated;Set up Where Assessed - Grooming: Unsupported sitting Upper Body Dressing: Performed;Min guard Where Assessed - Upper Body Dressing: Unsupported standing Lower Body Dressing: Performed;Moderate assistance Where Assessed - Lower Body Dressing: Unsupported sit to stand Toilet Transfer: Simulated;Minimal assistance Toilet Transfer Method: Sit to stand Toilet Transfer Equipment: Comfort height toilet Toileting - Clothing Manipulation and Hygiene: Simulated;Minimal assistance Where Assessed - Glass blower/designer Manipulation and Hygiene: Standing    OT Diagnosis: Generalized weakness  OT Problem List: Decreased strength;Decreased activity tolerance   OT Goals Acute Rehab OT Goals OT Goal Formulation: With patient ADL Goals Pt Will Perform Grooming: with supervision;Standing at sink ADL Goal: Grooming - Progress: Goal set today Pt Will Perform Lower Body Dressing: with supervision;Sit to stand from chair ADL Goal: Lower Body Dressing - Progress: Goal set today Pt Will Transfer to Toilet: with supervision;Comfort height toilet ADL Goal: Toilet Transfer - Progress: Goal set today Pt Will Perform Toileting - Clothing Manipulation: with supervision ADL Goal: Toileting - Clothing Manipulation - Progress: Goal set today  Visit  Information  Last OT Received On: 05/15/12 Assistance Needed: +2    Subjective Data  Subjective: i am doing a lot better   Prior Functioning     Home Living Lives With: Alone Available Help at Discharge: Family;Available PRN/intermittently (grandson runs errands for him) Type of Home: House Home Access: Level entry Home Layout: One level Bathroom Shower/Tub: Engineer, manufacturing systems: Standard Home Adaptive Equipment: Straight cane Prior Function Level of Independence: Independent with assistive device(s) Able to Take Stairs?: No Driving: No Vocation: Retired Musician: No difficulties            Copywriter, advertising Overall Cognitive Status: Appears within functional limits for tasks assessed/performed Arousal/Alertness: Awake/alert Orientation Level: Appears intact for tasks assessed Behavior During Session: [facility] for tasks performed Cognition - Other Comments: Pt more cognitively intact today.     Extremity/Trunk Assessment Right Upper Extremity Assessment RUE ROM/Strength/Tone: [facility] for tasks assessed Left Upper Extremity Assessment LUE ROM/Strength/Tone: WFL for tasks assessed     Mobility Bed Mobility Bed Mobility: Supine to Sit;Sitting - Scoot to Edge of Bed Supine to Sit: 4: Min assist;HOB elevated Details for Bed Mobility Assistance: Assist for trunk to attain sitting position with cues for technique and attending to task.  Transfers Sit to Stand: 4: Min assist;With upper extremity assist;From bed Stand to Sit: 4: Min assist;With armrests;To chair/3-in-1 Details for Transfer Assistance: Max cues for hand placement and safety as pt stood up by pulling on RW which was not close enough to him.            End of Session OT - End of Session Equipment Utilized During Treatment: Gait belt Activity Tolerance: Patient tolerated treatment well Patient left: in chair;with call bell/phone within reach;with chair alarm set Nurse  Communication: Mobility status  GO     Alba Cory 05/15/2012, 11:19 AM

## 2012-05-16 DIAGNOSIS — J9610 Chronic respiratory failure, unspecified whether with hypoxia or hypercapnia: Secondary | ICD-10-CM

## 2012-05-16 LAB — CBC
HCT: 36.7 % — ABNORMAL LOW (ref 39.0–52.0)
Hemoglobin: 10.7 g/dL — ABNORMAL LOW (ref 13.0–17.0)
MCH: 28 pg (ref 26.0–34.0)
MCHC: 29.2 g/dL — ABNORMAL LOW (ref 30.0–36.0)
MCV: 96.1 fL (ref 78.0–100.0)
Platelets: 71 K/uL — ABNORMAL LOW (ref 150–400)
RBC: 3.82 MIL/uL — ABNORMAL LOW (ref 4.22–5.81)
RDW: 14.6 % (ref 11.5–15.5)
WBC: 10.7 K/uL — ABNORMAL HIGH (ref 4.0–10.5)

## 2012-05-16 LAB — BASIC METABOLIC PANEL WITH GFR
BUN: 32 mg/dL — ABNORMAL HIGH (ref 6–23)
CO2: 42 meq/L (ref 19–32)
Calcium: 9.4 mg/dL (ref 8.4–10.5)
Chloride: 96 meq/L (ref 96–112)
Creatinine, Ser: 1.95 mg/dL — ABNORMAL HIGH (ref 0.50–1.35)
GFR calc Af Amer: 37 mL/min — ABNORMAL LOW
GFR calc non Af Amer: 32 mL/min — ABNORMAL LOW
Glucose, Bld: 214 mg/dL — ABNORMAL HIGH (ref 70–99)
Potassium: 3.9 meq/L (ref 3.5–5.1)
Sodium: 141 meq/L (ref 135–145)

## 2012-05-16 LAB — GLUCOSE, CAPILLARY: Glucose-Capillary: 136 mg/dL — ABNORMAL HIGH (ref 70–99)

## 2012-05-16 MED ORDER — FUROSEMIDE 40 MG PO TABS: 40.0000 mg | ORAL_TABLET | Freq: Two times a day (BID) | ORAL | Status: DC

## 2012-05-16 NOTE — Discharge Summary (Addendum)
Physician Discharge Summary  [name] [name]:[id number] DOB: January 10, 1941 DOA: 05/12/2012  PCP: Carollee Herter, MD  Admit date: 05/12/2012 Discharge date: 05/16/2012  Time spent: 45 minutes  Recommendations for Outpatient Follow-up:  1. Mandatory use of CPAP QHS per Home settings 2. FU with Dr.Wert 4/7 3. FU with Dr.Lalonde 3/25  Discharge Diagnoses:   Acute on chronic resp failure  COPD exacerbation  CO2 retention  Metabolic encephalopathy   Suspected Dementia   DIABETES MELLITUS, TYPE II   Morbid obesity   TOBACCO USER   OSA (obstructive sleep apnea) on BIPAP QHS   Chronic kidney disease   Leukocytosis   Thrombocytopenia   Chronic diastolic CHF   Discharge Condition: stable  Diet recommendation: Low Na  Filed Weights   05/12/12 2000 05/15/12 0400 05/15/12 1042  Weight: 112.8 kg (248 lb 10.9 oz) 109.9 kg (242 lb 4.6 oz) 110.4 kg (243 lb 6.2 oz)    History of present illness:  Who lives home alone. He is currently altered and unable to provide much history. So most was obtained from the record. He does states he lives home alone, does not cook- eats sandwiches when he does eat. Still smoke 1 pp week of cigarettes. Is on 4-5L of home O2.  Family called EMS as patient was acting confused and abnormal at home, apparently there are reports, he climbed on top of a dresser. When EMS arrived, his O2 sat was 77% on RA- it is reported that he wears 5 L of O2. In the ER, his CO2 on BMP was > 45- ABG was done and CO2 was so high it would not record.    Hospital Course:   1. Acute on chronic respiratory Failure:  - secondary to COPD exacerbation, severe underlying COPD, OSA/OHS,  - PCO2 was in the 100s initially without respiratory acidosis - Improved with BIPAP, now on QHS BIPAP only  - Suspect baseline CO2 is very high  - repeat ABG 3/15 am with PCO2 improved at 77 from 100s  - Used BIPAP last pm, unfortunately compliance will be a difficult issue  - currently mentating  well and distress improved and resolved - Was on IV rocephin/zithromax and solumedrol initially , these were subsequently stopped by Pulmonary Dr.Byrum on 3/16 due to confusion - albuterol and atrovent nebs  - respiratory status stable and improved, now on 3L Home 2 at baseline without any distress -recommend compliance with CPAP QHS  2. CHF: stable, was continued on PO lasix 40bid, 2D ECHO with EF of 55% and grade 2 diastolic dysfunction   3. DM: continue lantus, stable, SSI   4. Tobacco abuse: counselled   5. Metabolic encephalopathy: due to CO2 narcosis, improved,  - limit sedating medications   6. Suspect Dementia at baseline, long standing memory and functional decline per sister, will need formal eval for this once improved, TSH and B12 within normal limits   7. Thrombocytopenia: chronic, monitor   8. OSA: uses CPAP QHS  9. Poor living condition/unable to care for self/medication non complaince: needs assistance at discharge  Consultations:  Pulmonary Dr.Byrum  Discharge Exam: Filed Vitals:   05/15/12 2139 05/16/12 0617 05/16/12 0852 05/16/12 1403  BP: 124/50 127/58  124/48  Pulse: 86 76  66  Temp: 98.2 F (36.8 C) 98.6 F (37 C)  98.6 F (37 C)  TempSrc: Oral Oral  Oral  Resp: 18 20  18   Height:      Weight:      SpO2: 94% 100% 96%  94%    General: alert, awake, oriented to self and place Cardiovascular: S1S2/RRR Respiratory: poor air movement bilaterally  Discharge Instructions  Discharge Orders   Future Appointments Provider Department Dept Phone   05/23/2012 2:30 PM Ronnald Nian, MD [facility] [phone]   05/30/2012 4:00 PM Barbaraann Share, MD [facility] [phone]   06/05/2012 10:30 AM Nyoka Cowden, MD  [facility] [phone]   Future Orders Complete By Expires     Diet - low sodium heart healthy  As directed     Increase activity slowly  As directed         Medication List    STOP taking these  medications       insulin glulisine 100 UNIT/ML injection  Commonly known as:  APIDRA     Insulin Pen Needle 31G X 5 MM Misc      TAKE these medications       acetaminophen 500 MG tablet  Commonly known as:  TYLENOL  Take 500 mg by mouth every 6 (six) hours as needed for pain ("per bottle").     albuterol (2.5 MG/3ML) 0.083% nebulizer solution  Commonly known as:  PROVENTIL  Take 5 mg by nebulization every 4 (four) hours as needed for wheezing or shortness of breath. Dx 496     bisoprolol 5 MG tablet  Commonly known as:  ZEBETA  Take 1 tablet (5 mg total) by mouth 2 (two) times daily.     budesonide 0.25 MG/2ML nebulizer solution  Commonly known as:  PULMICORT  Take 0.25 mg by nebulization 2 (two) times daily.     calcitRIOL 0.25 MCG capsule  Commonly known as:  ROCALTROL  Take 1 capsule (0.25 mcg total) by mouth daily.     dextromethorphan-guaiFENesin 30-600 MG per 12 hr tablet  Commonly known as:  MUCINEX DM  Take 1 tablet by mouth every 12 (twelve) hours as needed (cough/congestion).     furosemide 40 MG tablet  Commonly known as:  LASIX  Take 1 tablet (40 mg total) by mouth 2 (two) times daily. Take 3 tablets every morning. Take 2 tablets every  Night.     glucose blood test strip  1 each by Other route 3 (three) times daily. Use as instructed ( This is for the SOLO V 2)     insulin glargine 100 UNIT/ML injection  Commonly known as:  LANTUS  Inject 10 Units into the skin 2 (two) times daily.     ipratropium 0.02 % nebulizer solution  Commonly known as:  ATROVENT  Take 2.5 mLs (0.5 mg total) by nebulization 4 (four) times daily. DX: 496     Lancets Misc  1 each by Does not apply route 3 (three) times daily. USE AS DIRECTED ( THIS IS FOR SOLO V 2 )     pantoprazole 40 MG tablet  Commonly known as:  PROTONIX  Take 40 mg by mouth daily.     paricalcitol 2 MCG capsule  Commonly known as:  ZEMPLAR  Take 2 mcg by mouth every other day.     simvastatin 40 MG  tablet  Commonly known as:  ZOCOR  Take 40 mg by mouth every other day. Takes at bedtime.     sodium chloride 0.65 % Soln nasal spray  Commonly known as:  OCEAN  Place 2 sprays into the nose every 4 (four) hours as needed for congestion.           Follow-up Information  Follow up with Sandrea Hughs, MD On 06/05/2012. (10:30am)    Contact information:   [address] [place] [address] [phone]       Follow up with Barbaraann Share, MD On 05/30/2012. (4:00 pm)    Contact information:   [address] [place] [address] [phone]        The results of significant diagnostics from this hospitalization (including imaging, microbiology, ancillary and laboratory) are listed below for reference.    Significant Diagnostic Studies: Dg Chest 2 View  05/12/2012  *RADIOLOGY REPORT*  Clinical Data: Shortness of breath, cough  CHEST - 2 VIEW  Comparison: 02/16/2012  Findings: Chronic interstitial markings.  Linear scarring in the right mid lung.  No focal consolidation. No pleural effusion or pneumothorax.  The heart is normal in size.  Degenerative changes of the visualized thoracolumbar spine.  IMPRESSION: No evidence of acute cardiopulmonary disease.   Original Report Authenticated By: Charline Bills, M.D.    Ct Head Wo Contrast  05/12/2012  *RADIOLOGY REPORT*  Clinical Data: Altered mental status, confusion, multiple falls  CT HEAD WITHOUT CONTRAST  Technique:  Contiguous axial images were obtained from the base of the skull through the vertex without contrast.  Comparison: None.  Findings: Motion degraded images.  No evidence of acute intracranial abnormality.  Intracranial atherosclerosis.  Cerebral volume is age appropriate.  No ventriculomegaly.  Partial opacification of the bilateral ethmoid sinuses.  The right mastoid air cells are partially opacified (series 3/image 8).  No evidence of calvarial fracture.  IMPRESSION: Motion degraded images.  No evidence of acute  intracranial abnormality.   Original Report Authenticated By: Charline Bills, M.D.    Dg Chest Port 1 View  05/13/2012  *RADIOLOGY REPORT*  Clinical Data: Increase shortness of breath.  PORTABLE CHEST - 1 VIEW  Comparison: One-view chest 05/12/2012  Findings: The heart size is normal.  Pulmonary vascular congestion is slightly increased.  Minimal bibasilar atelectasis is similar to the prior exam.  IMPRESSION:  1.  increase in mild pulmonary vascular congestion. 2.  Mild bibasilar atelectasis.   Original Report Authenticated By: Marin Roberts, M.D.     Microbiology: Recent Results (from the past 240 hour(s))  CULTURE, BLOOD (ROUTINE X 2)     Status: None   Collection Time    05/12/12  4:35 PM      Result Value Range Status   Specimen Description BLOOD LEFT HAND   Final   Special Requests BOTTLES DRAWN AEROBIC AND ANAEROBIC 5CC   Final   Culture  Setup Time 05/12/2012 23:32   Final   Culture     Final   Value:        BLOOD CULTURE RECEIVED NO GROWTH TO DATE CULTURE WILL BE HELD FOR 5 DAYS BEFORE ISSUING A FINAL NEGATIVE REPORT   Report Status PENDING   Incomplete  CULTURE, BLOOD (ROUTINE X 2)     Status: None   Collection Time    05/12/12  4:45 PM      Result Value Range Status   Specimen Description BLOOD LEFT ARM   Final   Special Requests BOTTLES DRAWN AEROBIC AND ANAEROBIC 4CC   Final   Culture  Setup Time 05/12/2012 23:32   Final   Culture     Final   Value:        BLOOD CULTURE RECEIVED NO GROWTH TO DATE CULTURE WILL BE HELD FOR 5 DAYS BEFORE ISSUING A FINAL NEGATIVE REPORT   Report Status PENDING  Incomplete  MRSA PCR SCREENING     Status: None   Collection Time    05/12/12  5:55 PM      Result Value Range Status   MRSA by PCR NEGATIVE  NEGATIVE Final   Comment:            The GeneXpert MRSA Assay (FDA     approved for NASAL specimens     only), is one component of a     comprehensive MRSA colonization     surveillance program. It is not     intended to diagnose  MRSA     infection nor to guide or     monitor treatment for     MRSA infections.     Labs: Basic Metabolic Panel:  Recent Labs Lab 05/12/12 1300 05/13/12 0350 05/14/12 0332 05/15/12 0338 05/16/12 0410  NA 144 146* 143 142 141  K 3.8 4.4 3.7 4.0 3.9  CL 89* 94* 91* 93* 96  CO2 >45* >45* >45* >45* 42*  GLUCOSE 145* 160* 149* 167* 214*  BUN 23 26* 38* 36* 32*  CREATININE 1.68* 1.69* 1.93* 1.90* 1.95*  CALCIUM 10.1 9.6 9.5 9.7 9.4   Liver Function Tests:  Recent Labs Lab 05/12/12 1300 05/13/12 0350  AST 21 14  ALT 28 21  ALKPHOS 81 63  BILITOT 0.6 0.4  PROT 7.5 6.7  ALBUMIN 3.4* 3.1*   No results found for this basename: LIPASE, AMYLASE,  in the last 168 hours No results found for this basename: AMMONIA,  in the last 168 hours CBC:  Recent Labs Lab 05/12/12 1300 05/13/12 0350 05/14/12 0332 05/15/12 0338 05/16/12 0410  WBC 14.1* 7.9 13.3* 12.6* 10.7*  NEUTROABS 10.5*  --   --   --   --   HGB 11.5* 10.3* 10.3* 11.8* 10.7*  HCT 40.9 36.2* 35.3* 40.5 36.7*  MCV 101.0* 98.9 95.7 96.7 96.1  PLT 33* 61* 104* 109* 71*   Cardiac Enzymes:  Recent Labs Lab 05/12/12 1300  TROPONINI <0.30   BNP: BNP (last 3 results)  Recent Labs  05/12/12 1300  PROBNP 1198.0*   CBG:  Recent Labs Lab 05/15/12 1134 05/15/12 1703 05/15/12 2225 05/16/12 0741 05/16/12 1209  GLUCAP 176* 192* 162* 136* 177*       Signed:  Jackilyn Umphlett  [facility] 05/16/2012, 3:01 PM

## 2012-05-16 NOTE — Progress Notes (Signed)
Spoke with patient at bedside who reports he is not going to SNF because he can not afford it. Spoke with [place] LCSW who states Medicaid application was submitted on March 13th. Patient reports he knows he needs to go "somewhere" but cannot afford 1,000 dollars a week for rehab. Patient is not agreeable to SNF at this time, despite being agreeable in the past with [facility] Management services. Gave inpatient LCSW the [facility] Care Management LCSW contact information and vice versa to discuss if needed. Left contact information with patient at bedside.   Raiford Noble, MSN-Ed, RN,BSN [facility] Liaison  [phone]

## 2012-05-16 NOTE — Progress Notes (Signed)
[facility] PROGRESS NOTE  [name] [name] WUJ:[id number] DOB: Dec 12, 1940 DOA: 05/12/2012 PCP: Carollee Herter, MD  Assessment/Plan: 1. Acute on chronic respiratory Failure: COPD exacerbation, severe underlying COPD, OSA/OHS - Improved with BIPAP, now on QHS BIPAP only - Suspect baseline CO2 is very high - repeat ABG 3/15 am with PCO2 improved at 77 from 100s - Used BIPAP last pm, unfortunately compliance will be a difficult issue - currently mentating well and distress improved  - Was on IV rocephin/zithromax and solumedrol initially , stopped by Dr,.Byrum on 3/16 - albuterol and atrovent nebs  - PCCM following, followed by Dr.Wert in office  2. CHF: continue PO lasix, decrease dose to 40bid,  2D ECHO with EF of 55% and grade 2 diastolic dysfunction  3. DM: continue lantus, hold meal coverage, SSI  4. Tobacco abuse: counselled   5. Metabolic encephalopathy: due to CO2 narcosis, improved,  - limit sedating medications  6. Suspect Dementia at baseline, long standing memory and functional decline per sister, will need formal eval for this once improved,  TSH and B12 within normal limits  7. Thrombocytopenia: chronic, monitor  8. OSA: non complaint with CPAP  9. Poor living condition/unable to care for self/medication non complaince: needs ALF vs SNF at DC  DVT proph: SCDs  Code Status: FULL Family Communication: d/w sister: [phone] Lynden Oxford) on 3/16 Disposition Plan: SNF vs ALF, CSW following  Consultants:  PCCM   Antibiotics:  Rocephin 3/14-3/16  zithromax 3/14-3/16  HPI/Subjective: Feels well, wore BIPAP last pm for a lil while, then took it off, wearing it at this time  Objective: Filed Vitals:   05/15/12 1928 05/15/12 2139 05/16/12 0617 05/16/12 0852  BP:  124/50 127/58   Pulse: 75 86 76   Temp:  98.2 F (36.8 C) 98.6 F (37 C)   TempSrc:  Oral Oral   Resp: 17 18 20    Height:      Weight:      SpO2:  94% 100% 96%    Intake/Output  Summary (Last 24 hours) at 05/16/12 1036 Last data filed at 05/16/12 0600  Gross per 24 hour  Intake    480 ml  Output    875 ml  Net   -395 ml   Filed Weights   05/12/12 2000 05/15/12 0400 05/15/12 1042  Weight: 112.8 kg (248 lb 10.9 oz) 109.9 kg (242 lb 4.6 oz) 110.4 kg (243 lb 6.2 oz)    Exam:   General:  AAo to self and place, partly to time, appropriate for the most part during the conversation  Cardiovascular: S1S2/RRR  Respiratory: poor air movement B/L  Abdomen: soft, distended, BS present  Ext: no edema c/c  Data Reviewed: Basic Metabolic Panel:  Recent Labs Lab 05/12/12 1300 05/13/12 0350 05/14/12 0332 05/15/12 0338 05/16/12 0410  NA 144 146* 143 142 141  K 3.8 4.4 3.7 4.0 3.9  CL 89* 94* 91* 93* 96  CO2 >45* >45* >45* >45* 42*  GLUCOSE 145* 160* 149* 167* 214*  BUN 23 26* 38* 36* 32*  CREATININE 1.68* 1.69* 1.93* 1.90* 1.95*  CALCIUM 10.1 9.6 9.5 9.7 9.4   Liver Function Tests:  Recent Labs Lab 05/12/12 1300 05/13/12 0350  AST 21 14  ALT 28 21  ALKPHOS 81 63  BILITOT 0.6 0.4  PROT 7.5 6.7  ALBUMIN 3.4* 3.1*   No results found for this basename: LIPASE, AMYLASE,  in the last 168 hours No results found for this basename: AMMONIA,  in the  last 168 hours CBC:  Recent Labs Lab 05/12/12 1300 05/13/12 0350 05/14/12 0332 05/15/12 0338 05/16/12 0410  WBC 14.1* 7.9 13.3* 12.6* 10.7*  NEUTROABS 10.5*  --   --   --   --   HGB 11.5* 10.3* 10.3* 11.8* 10.7*  HCT 40.9 36.2* 35.3* 40.5 36.7*  MCV 101.0* 98.9 95.7 96.7 96.1  PLT 33* 61* 104* 109* 71*   Cardiac Enzymes:  Recent Labs Lab 05/12/12 1300  TROPONINI <0.30   BNP (last 3 results)  Recent Labs  05/12/12 1300  PROBNP 1198.0*   CBG:  Recent Labs Lab 05/15/12 0752 05/15/12 1134 05/15/12 1703 05/15/12 2225 05/16/12 0741  GLUCAP 133* 176* 192* 162* 136*    Recent Results (from the past 240 hour(s))  CULTURE, BLOOD (ROUTINE X 2)     Status: None   Collection Time     05/12/12  4:35 PM      Result Value Range Status   Specimen Description BLOOD LEFT HAND   Final   Special Requests BOTTLES DRAWN AEROBIC AND ANAEROBIC 5CC   Final   Culture  Setup Time 05/12/2012 23:32   Final   Culture     Final   Value:        BLOOD CULTURE RECEIVED NO GROWTH TO DATE CULTURE WILL BE HELD FOR 5 DAYS BEFORE ISSUING A FINAL NEGATIVE REPORT   Report Status PENDING   Incomplete  CULTURE, BLOOD (ROUTINE X 2)     Status: None   Collection Time    05/12/12  4:45 PM      Result Value Range Status   Specimen Description BLOOD LEFT ARM   Final   Special Requests BOTTLES DRAWN AEROBIC AND ANAEROBIC 4CC   Final   Culture  Setup Time 05/12/2012 23:32   Final   Culture     Final   Value:        BLOOD CULTURE RECEIVED NO GROWTH TO DATE CULTURE WILL BE HELD FOR 5 DAYS BEFORE ISSUING A FINAL NEGATIVE REPORT   Report Status PENDING   Incomplete  MRSA PCR SCREENING     Status: None   Collection Time    05/12/12  5:55 PM      Result Value Range Status   MRSA by PCR NEGATIVE  NEGATIVE Final   Comment:            The GeneXpert MRSA Assay (FDA     approved for NASAL specimens     only), is one component of a     comprehensive MRSA colonization     surveillance program. It is not     intended to diagnose MRSA     infection nor to guide or     monitor treatment for     MRSA infections.     Studies: No results found.  Scheduled Meds: . albuterol  2.5 mg Nebulization Q6H  . antiseptic oral rinse  15 mL Mouth Rinse q12n4p  . calcitRIOL  0.25 mcg Oral Daily  . chlorhexidine  15 mL Mouth Rinse BID  . furosemide  40 mg Oral BID  . insulin aspart  0-15 Units Subcutaneous TID WC  . insulin aspart  0-5 Units Subcutaneous QHS  . insulin glargine  10 Units Subcutaneous BID  . ipratropium  0.5 mg Nebulization Q6H  . pantoprazole  40 mg Oral Daily  . paricalcitol  2 mcg Oral Q48H  . simvastatin  40 mg Oral Q48H  . sodium chloride  3 mL Intravenous Q12H  Continuous Infusions:    Principal Problem:   CO2 retention Active Problems:   DIABETES MELLITUS, TYPE II   Morbid obesity   TOBACCO USER   OSA (obstructive sleep apnea)   Chronic renal failure   Leukocytosis   Encephalopathy acute   Thrombocytopenia    Time spent:    [facility]  [facility] Pager [phone]. If 7PM-7AM, please contact night-coverage at [email or web address], password [facility] 05/16/2012, 10:36 AM  LOS: 4 days

## 2012-05-16 NOTE — Progress Notes (Signed)
Physical Therapy Treatment Patient Details Name: [name] [name] MRN: [id number] DOB: [birth year] Today's Date: 05/16/2012 Time: 1027-2536 PT Time Calculation (min): 23 min  PT Assessment / Plan / Recommendation Comments on Treatment Session  Pt feeling better.  Has been on home O2 for years. Assisted OOB to amb in hallway on 2 lts nasal.  Pt refusing SNF stating "I can't afford rehab".  Pt states his sister helps him some, grocceries, errands. Pt plans to D/C to home.     Follow Up Recommendations  SNF;Supervision/Assistance - 24 hour (Pt declined SNF rec)     Does the patient have the potential to tolerate intense rehabilitation     Barriers to Discharge        Equipment Recommendations   (states he has a RW already)    Recommendations for Other Services    Frequency Min 3X/week   Plan Discharge plan remains appropriate    Precautions / Restrictions Precautions Precautions: Fall Precaution Comments: home O2 Restrictions Weight Bearing Restrictions: Yes   Pertinent Vitals/Pain No c/o pain just mild stiffness    Mobility  Bed Mobility Bed Mobility: Supine to Sit;Sitting - Scoot to Edge of Bed Supine to Sit: 5: Supervision;4: Min guard Details for Bed Mobility Assistance: increased time and HOB elevated 45' Ambulation/Gait Ambulation Distance (Feet): 205 Feet Assistive device: Rolling walker Ambulation/Gait Assistance Details: 25% VC's on upright posture and proper walker to self distance.  Pt admits he has a RW but occassionaly uses it outside of the home. Amb on 2 lts O2 sats range from 86-92%.  VC's for purse lip breathing. Pt demon 2/4 DOE and required 2 standing rest breaks. Gait Pattern: Step-through pattern;Decreased stride length;Trunk flexed Gait velocity: decreased    Exercises     PT Diagnosis:    PT Problem List:   PT Treatment Interventions:     PT Goals    Visit Information  Last PT Received On: 05/16/12 Assistance Needed: +2    Subjective Data  Subjective: I can't afford Rehab, I am going home   Cognition       Balance     End of Session PT - End of Session Equipment Utilized During Treatment: Gait belt;Oxygen Activity Tolerance: Patient tolerated treatment well Patient left: in chair;with call bell/phone within reach;with nursing in room Nurse Communication: Mobility status   Felecia Shelling  PTA [facility]  Acute  Rehab Pager      [phone]

## 2012-05-16 NOTE — Progress Notes (Signed)
Report called to Joyce at [facility].   

## 2012-05-16 NOTE — Progress Notes (Signed)
Clinical Social Work Department CLINICAL SOCIAL WORK PLACEMENT NOTE 05/16/2012  Patient:  [name] [name], [name] [name]  Account Number:  [id number] Admit date:  05/12/2012  Clinical Social Worker:  Doroteo Glassman  Date/time:  05/14/2012 09:35 AM  Clinical Social Work is seeking post-discharge placement for this patient at the following level of care:   SKILLED NURSING   (*CSW will update this form in Epic as items are completed)   05/14/2012  Patient/family provided with [facility] Department of Clinical Social Work's list of facilities offering this level of care within the geographic area requested by the patient (or if unable, by the patient's family).  05/14/2012  Patient/family informed of their freedom to choose among providers that offer the needed level of care, that participate in Medicare, Medicaid or managed care program needed by the patient, have an available bed and are willing to accept the patient.  05/14/2012  Patient/family informed of [facility]' ownership interest in [facility], as well as of the fact that they are under no obligation to receive care at this facility.  PASARR submitted to EDS on 05/14/2012 PASARR number received from [employer] on 05/14/2012  FL2 transmitted to all facilities in geographic area requested by pt/family on  05/14/2012 FL2 transmitted to all facilities within larger geographic area on   Patient informed that his/her managed care company has contracts with or will negotiate with  certain facilities, including the following:     Patient/family informed of bed offers received:  05/16/2012 Patient chooses bed at [facility] [place] Physician recommends and patient chooses bed at    Patient to be transferred to [facility] [place] on  05/16/2012 Patient to be transferred to facility by ptar  The following physician request were entered in Epic:   Additional Comments:

## 2012-05-16 NOTE — Progress Notes (Signed)
CRITICAL VALUE ALERT  Critical value received:  CO2  Date of notification:  05/16/2012   Time of notification:  0530  Critical value read back:yes  Nurse who received alert:  mk  MD notified (1st page): improved from previously reported values, pt a retainer  Time of first page:  not  MD notified (2nd page):  Time of second page:  Responding MD:  none  Time MD responded:  not

## 2012-05-16 NOTE — Progress Notes (Addendum)
CSW met with patient and patient's sister at bedside. Patient now agreeable to snf. They are requesting [facility]. Patient is medically cleared.  Aowyn Rozeboom C. Erandi Lemma MSW, LCSW [phone] There is no bed available at [facility] for today. Spoke with family. They are agreeable to [facility] [place].

## 2012-05-18 LAB — CULTURE, BLOOD (ROUTINE X 2): Culture: NO GROWTH

## 2012-05-19 ENCOUNTER — Encounter: Payer: Self-pay | Admitting: Internal Medicine

## 2012-05-19 ENCOUNTER — Non-Acute Institutional Stay (SKILLED_NURSING_FACILITY): Payer: Medicare Other | Admitting: Internal Medicine

## 2012-05-19 DIAGNOSIS — R5381 Other malaise: Secondary | ICD-10-CM

## 2012-05-19 DIAGNOSIS — I509 Heart failure, unspecified: Secondary | ICD-10-CM

## 2012-05-19 DIAGNOSIS — K222 Esophageal obstruction: Secondary | ICD-10-CM

## 2012-05-19 DIAGNOSIS — G4733 Obstructive sleep apnea (adult) (pediatric): Secondary | ICD-10-CM

## 2012-05-19 DIAGNOSIS — N189 Chronic kidney disease, unspecified: Secondary | ICD-10-CM

## 2012-05-19 DIAGNOSIS — R531 Weakness: Secondary | ICD-10-CM

## 2012-05-19 DIAGNOSIS — E119 Type 2 diabetes mellitus without complications: Secondary | ICD-10-CM

## 2012-05-19 DIAGNOSIS — J449 Chronic obstructive pulmonary disease, unspecified: Secondary | ICD-10-CM

## 2012-05-19 DIAGNOSIS — E785 Hyperlipidemia, unspecified: Secondary | ICD-10-CM

## 2012-05-19 DIAGNOSIS — G9340 Encephalopathy, unspecified: Secondary | ICD-10-CM

## 2012-05-19 DIAGNOSIS — J4489 Other specified chronic obstructive pulmonary disease: Secondary | ICD-10-CM

## 2012-05-19 DIAGNOSIS — I10 Essential (primary) hypertension: Secondary | ICD-10-CM

## 2012-05-19 DIAGNOSIS — D649 Anemia, unspecified: Secondary | ICD-10-CM

## 2012-05-19 HISTORY — DX: Heart failure, unspecified: I50.9

## 2012-05-19 NOTE — Assessment & Plan Note (Signed)
Low hb/hct on discharge from hospital. Recheck cbc

## 2012-05-19 NOTE — Assessment & Plan Note (Signed)
Chronic co2 retention could have contributed some. There has been concern for baseline dementia in hospital. Normal tsh and b12. Will get MMSE to assess further.

## 2012-05-19 NOTE — Progress Notes (Signed)
Patient ID: [name] [name], male   DOB: [birth year], 72 y.o.   MRN: [id number]  HAGOP MCCOLLAM WUJ:[id number] DOB: Jul 30, 1940  PCP: Carollee Herter, MD  Code Status: full code  No Known Allergies  Chief Complaint: new admit post hospitalization 05/12/12- 05/16/12  HPI:  72 y/o male patient was hospitalized on above date with confusion and hypoxia. He was found to have acute on chronic respiratory failure in setting of copd exacerbation and OSA/OHS. He improved on bipap. He was also on broad spectrum antibiotic and solumedrol initially and pulmonary was consulted. As his breathing status imporved his AMS also improved some. co2 narcosis was thought to be contributing some to the encephalopathy. Once medically improved, given his dementia, social issue and weakness, he was sent to SNF for STR.  patient was seen in his room today. He is on o2 by nasal canula. He wants his toenails clipped and feet scrubbed. Denies any other concerns. No concerns from staff.  Review of Systems:  Review of Systems  Constitutional: Negative for fever and malaise/fatigue.  HENT: Negative.   Eyes: Negative.   Respiratory: Positive for cough. Negative for wheezing.   Cardiovascular: Positive for leg swelling. Negative for chest pain and palpitations.  Gastrointestinal: Negative.   Genitourinary: Negative.   Musculoskeletal: Negative.   Skin: Negative.   Neurological: Positive for weakness.  Psychiatric/Behavioral: Negative.     Past Medical History  Diagnosis Date  . Renal insufficiency   . Diabetes mellitus   . GERD (gastroesophageal reflux disease)   . Gastroparesis   . Esophageal stricture   . Hyperlipemia   . Hypertension   . Pancreatitis   . Chronic respiratory disease   . Depression   . Seizure disorder   . Arthritis   . Diverticulosis   . Sleep apnea     CPAP Machine  . Obesity   . ED (erectile dysfunction)   . Sleep apnea   . COPD (chronic obstructive pulmonary disease)     Gold  Stage  III/IV  . H/O polycythemia   . Chronic kidney disease (CKD), stage III (moderate)   . DDD (degenerative disc disease), lumbar   . History of esophageal stricture   . Erectile dysfunction   . Prostate cancer   . Peptic ulcer disease   . History of colon polyps   . Gout   . Gastroparesis    Past Surgical History  Procedure Laterality Date  . Prostate surgery    . Coronary artery bypass graft      x 4  . Bone marrow biopsy    . Cardiac catheterization  05/02/2008  . Back surgery      lumbar   Social History:   reports that he has been smoking Cigarettes.  He has a 60 pack-year smoking history. He has never used smokeless tobacco. He reports that he does not drink alcohol or use illicit drugs.  Family History  Problem Relation Age of Onset  . Colon cancer Neg Hx   . Cancer Brother     unsure what kind  . Heart disease Mother   . Heart disease Father   . Emphysema Father     Outpatient Encounter Prescriptions as of 05/19/2012  Medication Sig Dispense Refill  . acetaminophen (TYLENOL) 500 MG tablet Take 500 mg by mouth every 6 (six) hours as needed for pain ("per bottle").      . albuterol (PROVENTIL) (2.5 MG/3ML) 0.083% nebulizer solution Take 5 mg by nebulization every 4 (four) hours as  needed for wheezing or shortness of breath. Dx 496      . bisoprolol (ZEBETA) 5 MG tablet Take 1 tablet (5 mg total) by mouth 2 (two) times daily.  60 tablet  4  . budesonide (PULMICORT) 0.25 MG/2ML nebulizer solution Take 0.25 mg by nebulization 2 (two) times daily.      . calcitRIOL (ROCALTROL) 0.25 MCG capsule Take 1 capsule (0.25 mcg total) by mouth daily.  30 capsule  4  . dextromethorphan-guaiFENesin (MUCINEX DM) 30-600 MG per 12 hr tablet Take 1 tablet by mouth every 12 (twelve) hours as needed (cough/congestion).      . furosemide (LASIX) 40 MG tablet Take 40 mg by mouth 2 (two) times daily. Take 1 tablet twice daily      . glucose blood test strip 1 each by Other route 3 (three)  times daily. Use as instructed ( This is for the SOLO V 2)  100 each  12  . insulin glargine (LANTUS) 100 UNIT/ML injection Inject 10 Units into the skin 2 (two) times daily.      Marland Kitchen ipratropium (ATROVENT) 0.02 % nebulizer solution Take 2.5 mLs (0.5 mg total) by nebulization 4 (four) times daily. DX: 496  300 mL  5  . Lancets MISC 1 each by Does not apply route 3 (three) times daily. USE AS DIRECTED ( THIS IS FOR SOLO V 2 )  100 each  12  . pantoprazole (PROTONIX) 40 MG tablet Take 40 mg by mouth daily.      . paricalcitol (ZEMPLAR) 2 MCG capsule Take 2 mcg by mouth every other day.      . simvastatin (ZOCOR) 40 MG tablet Take 40 mg by mouth every other day. Takes at bedtime.      . sodium chloride (OCEAN) 0.65 % SOLN nasal spray Place 2 sprays into the nose every 4 (four) hours as needed for congestion.      . [DISCONTINUED] furosemide (LASIX) 40 MG tablet Take 1 tablet (40 mg total) by mouth 2 (two) times daily. Take 3 tablets every morning. Take 2 tablets every  Night.  30 tablet  0   No facility-administered encounter medications on file as of 05/19/2012.     Physical Exam:  Filed Vitals:   05/19/12 0940  BP: 106/58  Pulse: 64  Temp: 97.7 F (36.5 C)  Resp: 18  Height: 5\' 7"  (1.702 m)  Weight: 239 lb (108.41 kg)  SpO2: 94%    Constitutional: Vital signs reviewed.  Patient is a well-developed and well-nourished in no acute distress and cooperative with exam. Alert and oriented x3.  Head: Normocephalic and atraumatic Mouth: no erythema or exudates, MMM Eyes: PERRL, EOMI, conjunctivae normal, No scleral icterus.  Neck: Supple, Trachea midline normal ROM, No JVD, mass, thyromegaly, or carotid bruit present.  Cardiovascular: RRR, S1 normal, S2 normal, no MRG, pulses symmetric and intact bilaterally Pulmonary/Chest: CTAB, no wheezes, rales, or rhonchi Abdominal: Soft. Non-tender, non-distended, bowel sounds are normal, no masses, organomegaly, or guarding present.  Musculoskeletal: No  joint deformities, erythema, or stiffness, ROM full and no nontender Ext: trace edema and no cyanosis, pulses palpable bilaterally (DP and PT) Neurological: A&O x3, Strenght is normal and symmetric bilaterally, cranial nerve II-XII are grossly intact, no focal motor deficit, sensory intact to light touch bilaterally.  Skin: Warm, dry and intact. No rash, cyanosis, or clubbing.  Psychiatric: Normal mood and affect. speech and behavior is normal. Judgment and thought content normal. Cognition and memory are normal.   Labs  reviewed: Basic Metabolic Panel:  Recent Labs Lab 05/13/12 0350 05/14/12 0332 05/15/12 0338 05/16/12 0410  NA 146* 143 142 141  K 4.4 3.7 4.0 3.9  CL 94* 91* 93* 96  CO2 >45* >45* >45* 42*  GLUCOSE 160* 149* 167* 214*  BUN 26* 38* 36* 32*  CREATININE 1.69* 1.93* 1.90* 1.95*  CALCIUM 9.6 9.5 9.7 9.4   Liver Function Tests:  Recent Labs Lab 05/13/12 0350  AST 14  ALT 21  ALKPHOS 63  BILITOT 0.4  PROT 6.7  ALBUMIN 3.1*   CBC:  Recent Labs Lab 05/13/12 0350 05/14/12 0332 05/15/12 0338 05/16/12 0410  WBC 7.9 13.3* 12.6* 10.7*  HGB 10.3* 10.3* 11.8* 10.7*  HCT 36.2* 35.3* 40.5 36.7*  MCV 98.9 95.7 96.7 96.1  PLT 61* 104* 109* 71*   Cardiac Enzymes: trop <0.3 on 05/12/12 No results found for this basename: CKTOTAL, CKMB, CKMBINDEX, TROPONINI,  in the last 168 hours BNP: 1198 05/12/12 No components found with this basename: POCBNP,  CBG:  Recent Labs Lab 05/15/12 1134 05/15/12 1703 05/15/12 2225 05/16/12 0741 05/16/12 1209  GLUCAP 176* 192* 162* 136* 177*    Radiological Exams on Admission:  05/12/12 chest X-ray: No evidence of acute cardiopulmonary disease 05/12/12 - Ct Head Wo Contrast : No evidence of acute intracranial abnormality. Intracranial atherosclerosis. Cerebral volume is age appropriate. No ventriculomegaly. Partial opacification of the bilateral ethmoid sinuses. The right mastoid air cells are partially opacified (series 3/image  8). No evidence of calvarial fracture. 05/13/2012 chest X-ray : increase in mild pulmonary vascular congestion. 2. Mild bibasilar atelectasis.    Assessment/Plan  COPD Reviewed PFT from 2009,  Chronic hypercarbia. On bipap at present with bronchodilators.will continue albuterol, atrovent and budosenide. Counseled about smoking cessation  OSA (obstructive sleep apnea) Continue bipap at bedtime  HYPERTENSION Reviewed bp in facility. Continue bp monitoring. On bisoprolol 5 mg bid  CHF (congestive heart failure) Stable. Reviewed echocardiogram - ef 55%, grade 2 diastolic dysfunction. Continue lasix 40 mg bid, bisoprolol and statin. Monitor weight, check bmp  DIABETES MELLITUS, TYPE II No recent a1c. cbg from hospital reviewed. On lantus 10 u bid. Continue cbg monitor  HYPERLIPIDEMIA Continue simvastatin for now and monitor  ESOPHAGEAL STRICTURE Continue PPI at current dose and monitor  Encephalopathy acute Chronic co2 retention could have contributed some. There has been concern for baseline dementia in hospital. Normal tsh and b12. Will get MMSE to assess further.  Anemia Low hb/hct on discharge from hospital. Recheck cbc  Chronic renal failure Dm and HTN contributing to this. Avoid nsaids. On lasix for chf. Monitor bmp  Weakness generalized Here for rehabilitation. Will have him work with PT/OT, monitor po intake and nutritional status. Fall precautions. Goal is for pt to return home. Will have social services involved for co-ordination of discharge post completion of rehab    Transfers: Moderate Assist, Walk: Minimal Assist  Family/ staff Communication: talked about arranging for his toe nails clipped.    Goals of care: to return home

## 2012-05-19 NOTE — Assessment & Plan Note (Signed)
Continue bipap at bedtime

## 2012-05-19 NOTE — Assessment & Plan Note (Signed)
Here for rehabilitation. Will have him work with PT/OT, monitor po intake and nutritional status. Fall precautions. Goal is for pt to return home. Will have social services involved for co-ordination of discharge post completion of rehab

## 2012-05-19 NOTE — Assessment & Plan Note (Signed)
Continue PPI at current dose and monitor

## 2012-05-19 NOTE — Assessment & Plan Note (Signed)
No recent a1c. cbg from hospital reviewed. On lantus 10 u bid. Continue cbg monitor

## 2012-05-19 NOTE — Assessment & Plan Note (Signed)
Stable. Reviewed echocardiogram - ef 55%, grade 2 diastolic dysfunction. Continue lasix 40 mg bid, bisoprolol and statin. Monitor weight, check bmp

## 2012-05-19 NOTE — Assessment & Plan Note (Signed)
Dm and HTN contributing to this. Avoid nsaids. On lasix for chf. Monitor bmp

## 2012-05-19 NOTE — Assessment & Plan Note (Signed)
Reviewed bp in facility. Continue bp monitoring. On bisoprolol 5 mg bid

## 2012-05-19 NOTE — Assessment & Plan Note (Signed)
Continue simvastatin for now and monitor

## 2012-05-19 NOTE — Assessment & Plan Note (Signed)
Reviewed PFT from 2009,  Chronic hypercarbia. On bipap at present with bronchodilators.will continue albuterol, atrovent and budosenide. Counseled about smoking cessation

## 2012-05-23 ENCOUNTER — Ambulatory Visit: Payer: Medicare Other | Admitting: Family Medicine

## 2012-05-25 ENCOUNTER — Encounter: Payer: Self-pay | Admitting: Family Medicine

## 2012-05-25 ENCOUNTER — Ambulatory Visit (INDEPENDENT_AMBULATORY_CARE_PROVIDER_SITE_OTHER): Payer: Medicare Other | Admitting: Family Medicine

## 2012-05-25 VITALS — BP 116/50 | HR 70

## 2012-05-25 DIAGNOSIS — J9610 Chronic respiratory failure, unspecified whether with hypoxia or hypercapnia: Secondary | ICD-10-CM

## 2012-05-25 DIAGNOSIS — E878 Other disorders of electrolyte and fluid balance, not elsewhere classified: Secondary | ICD-10-CM

## 2012-05-25 DIAGNOSIS — J449 Chronic obstructive pulmonary disease, unspecified: Secondary | ICD-10-CM

## 2012-05-25 DIAGNOSIS — Z79899 Other long term (current) drug therapy: Secondary | ICD-10-CM

## 2012-05-25 LAB — COMPREHENSIVE METABOLIC PANEL
Alkaline Phosphatase: 72 U/L (ref 39–117)
BUN: 24 mg/dL — ABNORMAL HIGH (ref 6–23)
CO2: 44 mEq/L — ABNORMAL HIGH (ref 19–32)
Glucose, Bld: 234 mg/dL — ABNORMAL HIGH (ref 70–99)
Sodium: 144 mEq/L (ref 135–145)
Total Bilirubin: 0.5 mg/dL (ref 0.3–1.2)
Total Protein: 6.6 g/dL (ref 6.0–8.3)

## 2012-05-25 NOTE — Progress Notes (Signed)
  Subjective:    Patient ID: [name] [name], male    DOB: 1940/11/12, 72 y.o.   MRN: [id number]  HPI He is here for recheck after recent hospitalization for chronic respiratory failure. He was placed in a skilled nursing facility. Apparently he has reached his maximum benefit and they're now considering discharge. Apparently there is a question as to where he can go. His sister who is with him states that he apparently does not qualify for Medicaid. Review his record indicates he is on multiple medications. He states that they're not giving him short-acting insulin although review of record indicates it is ordered. He also had several lab abnormalities including electrolytes and renal function.   Review of Systems     Objective:   Physical Exam Alert and in no distress. Nasal cannula present. Cardiac exam shows a regular rhythm without murmurs or gallops. Lungs show distant heart sounds but no wheezing was noted.       Assessment & Plan:  COPD - Plan: CBC with Differential  Chronic respiratory failure - Plan: CBC with Differential  Disorder of electrolytes - Plan: Comprehensive metabolic panel  Encounter for long-term (current) use of other medications - Plan: CBC with Differential, Comprehensive metabolic panel 25 minutes spent discussing his overall care with him and his sister. She will followup with social services concerning his placement. I stated that I did not think he should be living alone. Also will reinforce the need for the nursing staff to check his blood sugar prior to eating and give short-acting insulin based on the present sliding scale protocol.

## 2012-05-26 LAB — CBC WITH DIFFERENTIAL/PLATELET
Basophils Relative: 0 % (ref 0–1)
Eosinophils Absolute: 0.1 10*3/uL (ref 0.0–0.7)
Eosinophils Relative: 2 % (ref 0–5)
HCT: 36.3 % — ABNORMAL LOW (ref 39.0–52.0)
Hemoglobin: 11.3 g/dL — ABNORMAL LOW (ref 13.0–17.0)
Lymphs Abs: 3 10*3/uL (ref 0.7–4.0)
MCH: 27.8 pg (ref 26.0–34.0)
MCHC: 31.1 g/dL (ref 30.0–36.0)
MCV: 89.2 fL (ref 78.0–100.0)
Monocytes Absolute: 0.4 10*3/uL (ref 0.1–1.0)
Monocytes Relative: 6 % (ref 3–12)
Neutrophils Relative %: 48 % (ref 43–77)
RBC: 4.07 MIL/uL — ABNORMAL LOW (ref 4.22–5.81)

## 2012-05-29 ENCOUNTER — Encounter: Payer: Self-pay | Admitting: *Deleted

## 2012-05-30 ENCOUNTER — Encounter: Payer: Self-pay | Admitting: Pulmonary Disease

## 2012-05-30 ENCOUNTER — Ambulatory Visit (INDEPENDENT_AMBULATORY_CARE_PROVIDER_SITE_OTHER): Payer: Medicare Other | Admitting: Pulmonary Disease

## 2012-05-30 ENCOUNTER — Telehealth: Payer: Self-pay | Admitting: Pulmonary Disease

## 2012-05-30 VITALS — BP 102/52 | HR 68 | Temp 98.5°F | Ht 67.5 in | Wt 243.2 lb

## 2012-05-30 DIAGNOSIS — G4733 Obstructive sleep apnea (adult) (pediatric): Secondary | ICD-10-CM

## 2012-05-30 DIAGNOSIS — E662 Morbid (severe) obesity with alveolar hypoventilation: Secondary | ICD-10-CM

## 2012-05-30 NOTE — Assessment & Plan Note (Signed)
The patient has been wearing bilevel compliantly, but is now having issues with the functioning of his machine.  He also is using a throw away CPAP mask from the hospital, and will obviously need a more appropriate mask.  Finally, he has very little understanding of how to make adjustments to his machine, and will have his medical equipment company work with him on this.  I've encouraged him to work aggressively on weight loss, and he is to followup with me in 6 months if doing well.

## 2012-05-30 NOTE — Telephone Encounter (Signed)
Spoke with [employer]...verified that patient has had sleep study in past (1980s) and has been using CPAP/BiPap since. Nothing further needed.

## 2012-05-30 NOTE — Progress Notes (Signed)
  Subjective:    Patient ID: [name] [name], male    DOB: 10/26/1940, 72 y.o.   MRN: [id number]  HPI Patient comes in today for followup of his obstructive sleep apnea and obesity hypoventilation syndrome.  He was recently in the hospital for decompensation, as well as an element of heart failure.  He was treated with noninvasive positive pressure ventilation, oxygen, as well as diuresis.  His pCO2 subsequently decreased to his usual baseline.  The patient currently is wearing his bilevel compliantly at assisted living, however he is wearing a cheap plastic mask that he received from the hospital.  I have explained this is not meant for long-term use.  He is also having trouble with his BiPAP machine, and is probably overdue for replacement.  His weight is actually down 5 pounds from his last visit one year ago.   Review of Systems  Constitutional: Negative for fever and unexpected weight change.  HENT: Positive for rhinorrhea. Negative for ear pain, nosebleeds, congestion, sore throat, sneezing, trouble swallowing, dental problem, postnasal drip and sinus pressure.   Eyes: Negative for redness and itching.  Respiratory: Positive for shortness of breath ( in the mornings). Negative for cough, chest tightness and wheezing.   Cardiovascular: Negative for palpitations and leg swelling.  Gastrointestinal: Negative for nausea and vomiting.  Genitourinary: Negative for dysuria.  Musculoskeletal: Negative for joint swelling.  Skin: Negative for rash.  Neurological: Headaches: in the mornings.  Hematological: Does not bruise/bleed easily.  Psychiatric/Behavioral: Negative for dysphoric mood. The patient is not nervous/anxious.        Objective:   Physical Exam Obese male in no acute distress Nose without purulence or discharge noted No skin breakdown or pressure necrosis from the CPAP mask Neck without lymphadenopathy or thyromegaly Lower extremities with 1+ edema, no cyanosis Alert and  oriented, moves all 4 extremities.       Assessment & Plan:

## 2012-05-30 NOTE — Patient Instructions (Addendum)
Will get your equipment company to get you new mask, supplies, and check your bipap machine.  Your machine may need to be replaced. Work on weight loss followup with me in 6mos.

## 2012-06-01 ENCOUNTER — Non-Acute Institutional Stay (SKILLED_NURSING_FACILITY): Payer: Medicare Other | Admitting: Internal Medicine

## 2012-06-01 DIAGNOSIS — I10 Essential (primary) hypertension: Secondary | ICD-10-CM

## 2012-06-01 DIAGNOSIS — E119 Type 2 diabetes mellitus without complications: Secondary | ICD-10-CM

## 2012-06-01 DIAGNOSIS — J9610 Chronic respiratory failure, unspecified whether with hypoxia or hypercapnia: Secondary | ICD-10-CM

## 2012-06-01 DIAGNOSIS — I509 Heart failure, unspecified: Secondary | ICD-10-CM

## 2012-06-01 DIAGNOSIS — J449 Chronic obstructive pulmonary disease, unspecified: Secondary | ICD-10-CM

## 2012-06-01 NOTE — Progress Notes (Signed)
Patient ID: [name] [name], male   DOB: Oct 02, 1940, 72 y.o.   MRN: [id number]    PCP: Carollee Herter, MD   No Known Allergies  Chief Complaint: discharge visit  HPI:  72 y/o male patient here for STR post hypoxemia in setting of chf and copd is ready to be discharged home. He was seen in his roo today with Armed forces technical officer. No concerns from staff. He denies any complaints  Review of Systems: Review of Systems  Constitutional: Negative for fever and chills.  HENT: Negative for congestion.   Eyes: Negative for blurred vision.  Respiratory: Positive for shortness of breath.   Cardiovascular: Negative for chest pain and palpitations.  Gastrointestinal: Negative for abdominal pain.  Genitourinary: Negative for dysuria.  Musculoskeletal: Negative for falls.  Skin: Negative for rash.  Neurological: Negative for dizziness, focal weakness, weakness and headaches.  Psychiatric/Behavioral: Negative for depression.    Past Medical History  Diagnosis Date  . Renal insufficiency   . Diabetes mellitus   . GERD (gastroesophageal reflux disease)   . Gastroparesis   . Esophageal stricture   . Hyperlipemia   . Hypertension   . Pancreatitis   . Chronic respiratory disease   . Depression   . Seizure disorder   . Arthritis   . Diverticulosis   . Sleep apnea     CPAP Machine  . Obesity   . ED (erectile dysfunction)   . Sleep apnea   . COPD (chronic obstructive pulmonary disease)     Gold Stage  III/IV  . H/O polycythemia   . Chronic kidney disease (CKD), stage III (moderate)   . DDD (degenerative disc disease), lumbar   . History of esophageal stricture   . Erectile dysfunction   . Prostate cancer   . Peptic ulcer disease   . History of colon polyps   . Gout   . Gastroparesis    Past Surgical History  Procedure Laterality Date  . Prostate surgery    . Coronary artery bypass graft      x 4  . Bone marrow biopsy    . Cardiac catheterization  05/02/2008  . Back surgery       lumbar   Social History:   reports that he quit smoking about 2 weeks ago. His smoking use included Cigarettes. He has a 60 pack-year smoking history. He has never used smokeless tobacco. He reports that he does not drink alcohol or use illicit drugs.  Family History  Problem Relation Age of Onset  . Colon cancer Neg Hx   . Cancer Brother     unsure what kind  . Heart disease Mother   . Heart disease Father   . Emphysema Father     Medications: Patient's Medications  New Prescriptions   No medications on file  Previous Medications   ACETAMINOPHEN (TYLENOL) 500 MG TABLET    Take 500 mg by mouth every 6 (six) hours as needed for pain ("per bottle").   ALBUTEROL (PROVENTIL) (2.5 MG/3ML) 0.083% NEBULIZER SOLUTION    Take 5 mg by nebulization every 4 (four) hours as needed for wheezing or shortness of breath. Dx 496   BISOPROLOL (ZEBETA) 5 MG TABLET    Take 1 tablet (5 mg total) by mouth 2 (two) times daily.   BUDESONIDE (PULMICORT) 0.25 MG/2ML NEBULIZER SOLUTION    Take 0.25 mg by nebulization 2 (two) times daily.   CALCITRIOL (ROCALTROL) 0.25 MCG CAPSULE    Take 1 capsule (0.25 mcg total) by mouth daily.  DEXTROMETHORPHAN-GUAIFENESIN (MUCINEX DM) 30-600 MG PER 12 HR TABLET    Take 1 tablet by mouth every 12 (twelve) hours as needed (cough/congestion).   FUROSEMIDE (LASIX) 40 MG TABLET    Take 40 mg by mouth daily. Take 3 tablet - 120 mg daily in am an 2 tab 80 mg in evening   GLUCOSE BLOOD TEST STRIP    1 each by Other route 3 (three) times daily. Use as instructed ( This is for the SOLO V 2)   INSULIN ASPART (NOVOLOG) 100 UNIT/ML INJECTION    Inject 0-149 Units into the skin as directed. Per Sliding Scale   INSULIN GLARGINE (LANTUS) 100 UNIT/ML INJECTION    Inject 10 Units into the skin 2 (two) times daily.   IPRATROPIUM (ATROVENT) 0.02 % NEBULIZER SOLUTION    Take 2.5 mLs (0.5 mg total) by nebulization 4 (four) times daily. DX: 496   LANCETS MISC    1 each by Does not apply route 3  (three) times daily. USE AS DIRECTED ( THIS IS FOR SOLO V 2 )   PANTOPRAZOLE (PROTONIX) 40 MG TABLET    Take 40 mg by mouth daily.   PARICALCITOL (ZEMPLAR) 2 MCG CAPSULE    Take 2 mcg by mouth every other day.   SIMVASTATIN (ZOCOR) 40 MG TABLET    Take 40 mg by mouth every other day. Takes at bedtime.   SODIUM CHLORIDE (OCEAN) 0.65 % SOLN NASAL SPRAY    Place 2 sprays into the nose every 4 (four) hours as needed for congestion.  Modified Medications   No medications on file  Discontinued Medications   No medications on file     Physical Exam: Filed Vitals:   06/01/12 1535  BP: 120/61  Pulse: 86  Temp: 97.9 F (36.6 C)  Resp: 18  Height: 5\' 7"  (1.702 m)  Weight: 239 lb (108.41 kg)  SpO2: 91%   Physical Exam  Constitutional: He is oriented to person, place, and time. No distress.  obese  HENT:  Head: Normocephalic and atraumatic.  Mouth/Throat: Oropharynx is clear and moist.  Eyes: Pupils are equal, round, and reactive to light.  Neck: Normal range of motion. Neck supple.  Cardiovascular: Normal rate and regular rhythm.   Pulmonary/Chest: Effort normal and breath sounds normal.  Abdominal: Soft. Bowel sounds are normal.  Musculoskeletal: Normal range of motion. He exhibits edema.  Neurological: He is alert and oriented to person, place, and time.  Skin: Skin is warm and dry. He is not diaphoretic.  Psychiatric: He has a normal mood and affect.     Labs reviewed: Basic Metabolic Panel:  Recent Labs  62/13/08 0338 05/16/12 0410 05/25/12 1543  NA 142 141 144  K 4.0 3.9 4.5  CL 93* 96 94*  CO2 >45* 42* 44*  GLUCOSE 167* 214* 234*  BUN 36* 32* 24*  CREATININE 1.90* 1.95* 1.88*  CALCIUM 9.7 9.4 9.3   Liver Function Tests:  Recent Labs  05/12/12 1300 05/13/12 0350 05/25/12 1543  AST 21 14 13   ALT 28 21 14   ALKPHOS 81 63 72  BILITOT 0.6 0.4 0.5  PROT 7.5 6.7 6.6  ALBUMIN 3.4* 3.1* 3.8   CBC:  Recent Labs  02/16/12 1915 05/12/12 1300  05/15/12 0338  05/16/12 0410 05/25/12 1543  WBC 7.7 14.1*  < > 12.6* 10.7* 6.8  NEUTROABS 4.9 10.5*  --   --   --  3.2  HGB 12.5* 11.5*  < > 11.8* 10.7* 11.3*  HCT 44.0 40.9  < >  40.5 36.7* 36.3*  MCV 98.9 101.0*  < > 96.7 96.1 89.2  PLT PLATELET CLUMPS NOTED ON SMEAR, COUNT APPEARS DECREASED 33*  < > 109* 71* 26*  < > = values in this interval not displayed. Cardiac Enzymes:  Recent Labs  05/12/12 1300  TROPONINI <0.30    Assessment/Plan anik wesch is ready to be discharged home with PT, OT and RN sevices. He will need a tub shower bench and rollator walker on discharge with his obesity, copd and chf alongwith poor endurance. He will also need a nebulizer machine and o2 by nasal canula at 3l/min. He will need heated auto bipap machine for bedtime with setting from 5-20 to aid further with hypoxemia. Other requirements are glucometer, insulin needles, lancets and strips for monitoring his blood sugar and insulin administration  Have provided 30 days scripts for medications  DM- continue lantus bid with premeal insulin for now and to follow with pcp  COPD- continue bronchodilator treatment with o2 by nasal canula and cpap at bedtime  chf- continue lasix, monitor weight and will continue statin  Pt to follow with pulmonary and pcp as outpatient

## 2012-06-05 ENCOUNTER — Inpatient Hospital Stay: Payer: Medicare Other | Admitting: Internal Medicine

## 2012-06-05 ENCOUNTER — Ambulatory Visit: Payer: Medicare Other | Admitting: Family Medicine

## 2012-06-06 ENCOUNTER — Ambulatory Visit (INDEPENDENT_AMBULATORY_CARE_PROVIDER_SITE_OTHER): Payer: Medicare Other | Admitting: Family Medicine

## 2012-06-06 ENCOUNTER — Encounter: Payer: Self-pay | Admitting: Family Medicine

## 2012-06-06 ENCOUNTER — Ambulatory Visit: Payer: Medicare Other | Admitting: Family Medicine

## 2012-06-06 VITALS — BP 120/70 | HR 90 | Wt 244.0 lb

## 2012-06-06 DIAGNOSIS — J9610 Chronic respiratory failure, unspecified whether with hypoxia or hypercapnia: Secondary | ICD-10-CM

## 2012-06-06 DIAGNOSIS — L723 Sebaceous cyst: Secondary | ICD-10-CM

## 2012-06-07 NOTE — Progress Notes (Signed)
  Subjective:    Patient ID: [name] [name], male    DOB: 1940/11/29, 72 y.o.   MRN: [id number]  HPI He is here with his sister to have forms filled out to get transportation. He continues on medications listed in the chart. They're in the process of trying to get him certified through [facility]. He does complain of some vague back discomfort as well as wants me to evaluate a lesion on his back.He has no other concerns or complaints.   Review of Systems     Objective:   Physical Exam Alert and slightly tachypnea with nasal cannula in place. Lungs show distant heart sounds. Exam of his back does show a healing lesion that appears to be from a sebaceous cyst       Assessment & Plan:  Chronic respiratory failure  Sebaceous cyst forms were filled out. Reassured him that the lesion on his back was nothing to worry about however if it becomes infected again, they are to call me. His sister will continue to work to get Medicaid coverage so he can go into an assisted living facility.

## 2012-06-08 ENCOUNTER — Other Ambulatory Visit: Payer: Self-pay | Admitting: *Deleted

## 2012-06-12 ENCOUNTER — Other Ambulatory Visit: Payer: Self-pay

## 2012-06-12 MED ORDER — INSULIN ASPART 100 UNIT/ML ~~LOC~~ SOLN: 0.0000 [IU] | SUBCUTANEOUS | Status: DC

## 2012-06-12 NOTE — Telephone Encounter (Signed)
Sent in [facility] log

## 2012-06-13 ENCOUNTER — Ambulatory Visit (INDEPENDENT_AMBULATORY_CARE_PROVIDER_SITE_OTHER): Payer: Medicare Other | Admitting: Internal Medicine

## 2012-06-13 ENCOUNTER — Encounter: Payer: Self-pay | Admitting: Internal Medicine

## 2012-06-13 VITALS — BP 120/62 | HR 83 | Temp 98.2°F | Ht 68.0 in | Wt 240.8 lb

## 2012-06-13 DIAGNOSIS — J449 Chronic obstructive pulmonary disease, unspecified: Secondary | ICD-10-CM

## 2012-06-13 DIAGNOSIS — J9610 Chronic respiratory failure, unspecified whether with hypoxia or hypercapnia: Secondary | ICD-10-CM

## 2012-06-13 DIAGNOSIS — J4489 Other specified chronic obstructive pulmonary disease: Secondary | ICD-10-CM

## 2012-06-13 NOTE — Patient Instructions (Addendum)
Please see patient coordinator before you leave today  to schedule [facility] needs to you 3lpm 24 hours per day non-pulsed and all the equipment you need for your bipap machine, increase to 5lpm with walking more than room to room  See Tammy NP w/in 2 weeks with all your medications, even over the counter meds, separated in two separate bags, the ones you take no matter what vs the ones you stop once you feel better and take only as needed when you feel you need them.   Tammy  will generate for you a new user friendly medication calendar that will put [place] all on the same page re: your medication use.     Without this process, it simply isn't possible to assure that we are providing  your outpatient care  with  the attention to detail we feel you deserve.   If we cannot assure that you're getting that kind of care,  then we cannot manage your problem effectively from this clinic.  Once you have seen Tammy and we are sure that we're all on the same page with your medication use she will arrange follow up with me.

## 2012-06-13 NOTE — Progress Notes (Signed)
* Subjective:    Patient ID: [name] [name], male    DOB: 1940-06-11    MRN: [id number]  HPI 59 yobm with morbid obesity actively smoking followed in pulmonary clinic for copd/ chronic resp failure - PFTs 10/13/07 FEV1 of 45%, ratio 63%, FVC 15% improvement after bronchodilators DLCO 45%    05/04/2011 Post Hospital follow up  Admitted 04/16/2011 and discharged 04/28/2011 for acute on chronic hypercarbic/hypoxic respiratory failure, PNA -RLL, volume overload/pulmonary edema w/ decompensated on cor pulmonale ,AECOPD vs acei effect w/ ongoing smoking. Tx with IV abx, steroids, diuresis, BIPAP. Discharged on Levaquin and steroid taper.   Cc  breathing has improved since discharge but still has some shortness of breath with exertion along with weakness and fatigue. Minimum cough. Clear mucus production with occasional dark red tinged sputum. BLE edema, greater on right than left. Uses BiPAP at night and states that he's sleeping much better now than he has in years. Has completed discharge antibiotics. Uses nebulizer's three-  four times daily and is completing current steroid taper.   On 5L continuous O2 and reports smoking 4-6 cigarettes since discharge. Hx  5 ppd smoking at his max.   Admits to smoking inside home and with removal of oxygen in a separate room. Will occasionally leave oxygen system on.  CXR >  improved aeration with some residual opacity in RLL  rec Finish Prednisone taper as directed Low salt diet.  Continue Xopenex and Atrovent Neb Four times a day   Very important to stop smoking.  No smoking in house - with oxygen -it is very dangerous-flammable.  Legs elevated.  Follow up Dr. Sherene [name]  In 3 weeks Dr. Sherene [name]   follow up Dr. Shelle [name] as planned for sleep consult.   05/27/2011 f/u ov/[name] [name] cc "cpap working better" (record says it's bipap and he has new machine) wearing 02  at hs and practically all day on 02 at 5lpm breathing better than he has in a year now off acei and actually ran out of  albuterol > 1 week prior to OV  s effect.  Thoroughly confused with names of meds/ machines/ 02 rx.  No purulent sputum. Leg swelling better >>labs-improved renal fxn off ace  and xray -w / residual opacity at right base.   09/10/2011 Follow up and Med review  Returns for a three-month followup and medication review. We reviewed all his medications and organized them into a medication calendar. Says that he is at his baseline. Has daily cough and congestion mainly clear to white mucus. Continues to smoke with no plans on quitting. Smoking cessation discussed. Is having difficulty affording his ipratropium nebulizer through the pharmacy. We discussed getting his nebulizer through the DME company rec Add Budesonide Neb 0.25mg  Twice daily   Continue on Ipratropium Neb Four times a day  (sent rx to DME company -[facility] )  May get saline nasal spray for nasal stuffiness May get mucinex DM Twice daily  As needed  Cough/congestion  Follow med calendar closely and bring to each visit.  follow up Dr. Sherene [name]  In  3 months and As needed   I will call with xray results. > Chronic lung changes consistent with COPD.  Minimal atelectasis or scarring at bases.   12/16/2011 f/u ov/[name] [name] cc missed appt  01/25/2012 f/u ov/[name] [name] cc missed appt  05/30/12 K [name] [name] for osa/ohs rec Will get your equipment company to get you new mask, supplies, and check your bipap machine.  Your machine may need to be  replaced. Work on [employer] loss  06/13/2012 f/u ov/[name] [name] f/u   Copd/ chronic resp failure Chief Complaint  Patient presents with  . Follow-up    pt c/o increased SOB, productive cough with tan phlegm since last OV.   wearing 3lpm not using bipap since last ov with [name] [name] and not saturating well  on 3lpm pulsed on arrival, overal worse since last ov, thoroughly confused with meds, nebs.    No obvious daytime variabilty or assoc  or cp or chest tightness, subjective wheeze overt sinus or hb symptoms. No unusual exp hx  or h/o childhood pna/ asthma or premature birth to his knowledge.   Sleeping ok without nocturnal  or early am exacerbation  of respiratory  c/o's or need for noct saba. Also denies any obvious fluctuation of symptoms with weather or environmental changes or other aggravating or alleviating factors except as outlined above   ROS  The following are not active complaints unless bolded sore throat, dysphagia, dental problems, itching, sneezing,  nasal congestion or excess/ purulent secretions, ear ache,   fever, chills, sweats, unintended wt loss, pleuritic or exertional cp, hemoptysis,  orthopnea pnd or leg swelling, presyncope, palpitations, heartburn, abdominal pain, anorexia, nausea, vomiting, diarrhea  or change in bowel or urinary habits, change in stools or urine, dysuria,hematuria,  rash, arthralgias, visual complaints, headache, numbness weakness or ataxia or problems with walking or coordination,  change in mood/affect or memory.                          Objective:     GEN: obese,   elderly  AAM in NAD Wt Readings from Last 3 Encounters:  06/13/12 240 lb 12.8 oz (109.226 kg)  06/06/12 244 lb (110.678 kg)  06/01/12 239 lb (108.41 kg)     HEENT:  Oral pharyngeal pharynx clear with no lesions, no postnasal drip or exudate noted.   NECK:  Supple w/ fair ROM. No JVD. Normal carotid impulses w/o bruits; no thyromegaly or nodules palpated; no lymphadenopathy.  RESP:  distanat bs, trace end exp wheeze   CARD:  RRR. No murmurs, rubs or gallops. trace edema to BLE pulses intact. No cyanosis or clubbing.  GI:   Obese. soft & nontender. Bowel sounds present in all quadrants. No organomegaly or masses detected.  Musco: Warm bil, no deformities or joint swelling noted.   Neuro: alert and oriented x 4. no focal deficits noted.    Skin: Warm and dry. No lesions or rashes  pcxr 05/13/12  increase in mild pulmonary vascular congestion.  2. Mild bibasilar atelectasis.       cxr 09/10/11 Chronic lung changes consistent with COPD.  Minimal atelectasis or scarring at bases.  Assessment & Plan:

## 2012-06-15 NOTE — Assessment & Plan Note (Addendum)
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 - hco3 38 05/27/2011  So hypercarbic as well  DDX of  difficult airways managment all start with A and  include Adherence, Ace Inhibitors, Acid Reflux, Active Sinus Disease, Alpha 1 Antitripsin deficiency, Anxiety masquerading as Airways dz,  ABPA,  allergy(esp in young), Aspiration (esp in elderly), Adverse effects of DPI,  Active smokers, plus two Bs  = Bronchiectasis and Beta blocker use..and one C= CHF   Adherence is always the initial "prime suspect" and is a multilayered concern that requires a "trust but verify" approach in every patient - starting with knowing how to use medications, especially inhalers, correctly, keeping up with refills and understanding the fundamental difference between maintenance and prns vs those medications only taken for a very short course and then stopped and not refilled.  To keep things simple, I have asked the patient to first separate medicines that are perceived as maintenance, that is to be taken daily "no matter what", from those medicines that are taken on only on an as-needed basis and I have given the patient examples of both, and then return to see our NP to generate a  detailed  medication calendar which should be followed until the next physician sees the patient and updates it.     ? Active smoker > denies at present, reinforced importance

## 2012-06-15 NOTE — Assessment & Plan Note (Signed)
-   Multifactorial GOLD III copd and ohs -Chronic 02 dep RF - hco3 38 05/27/2011  So hypercarbic as well - 06/13/12 02 sat walking 5 liters pulsed was 86%. Placed pt on 3 liters continuous and sats were 92% at rest  Rx effective 06/14/11: 3lpm 24 h 24/7 and no pulsing 06/13/2012  With increase to 5lpm continuous at more than room to room walking  See instructions for specific recommendations which were reviewed directly with the patient who was given a copy with highlighter outlining the key components.

## 2012-06-19 ENCOUNTER — Emergency Department (HOSPITAL_COMMUNITY): Payer: Medicare Other

## 2012-06-19 ENCOUNTER — Inpatient Hospital Stay (HOSPITAL_COMMUNITY)
Admission: EM | Admit: 2012-06-19 | Discharge: 2012-06-23 | DRG: 208 | Disposition: A | Discharge status: Skilled Nursing Facility | Payer: Medicare Other | Attending: Internal Medicine | Admitting: Internal Medicine

## 2012-06-19 ENCOUNTER — Telehealth: Payer: Self-pay

## 2012-06-19 ENCOUNTER — Telehealth: Payer: Self-pay | Admitting: Family Medicine

## 2012-06-19 ENCOUNTER — Encounter (HOSPITAL_COMMUNITY): Payer: Self-pay | Admitting: *Deleted

## 2012-06-19 DIAGNOSIS — J9610 Chronic respiratory failure, unspecified whether with hypoxia or hypercapnia: Secondary | ICD-10-CM

## 2012-06-19 DIAGNOSIS — J9620 Acute and chronic respiratory failure, unspecified whether with hypoxia or hypercapnia: Principal | ICD-10-CM

## 2012-06-19 DIAGNOSIS — I2510 Atherosclerotic heart disease of native coronary artery without angina pectoris: Secondary | ICD-10-CM | POA: Diagnosis present

## 2012-06-19 DIAGNOSIS — I509 Heart failure, unspecified: Secondary | ICD-10-CM

## 2012-06-19 DIAGNOSIS — I10 Essential (primary) hypertension: Secondary | ICD-10-CM

## 2012-06-19 DIAGNOSIS — G4733 Obstructive sleep apnea (adult) (pediatric): Secondary | ICD-10-CM

## 2012-06-19 DIAGNOSIS — I2781 Cor pulmonale (chronic): Secondary | ICD-10-CM

## 2012-06-19 DIAGNOSIS — G9340 Encephalopathy, unspecified: Secondary | ICD-10-CM

## 2012-06-19 DIAGNOSIS — K5730 Diverticulosis of large intestine without perforation or abscess without bleeding: Secondary | ICD-10-CM

## 2012-06-19 DIAGNOSIS — K3184 Gastroparesis: Secondary | ICD-10-CM

## 2012-06-19 DIAGNOSIS — D696 Thrombocytopenia, unspecified: Secondary | ICD-10-CM

## 2012-06-19 DIAGNOSIS — F329 Major depressive disorder, single episode, unspecified: Secondary | ICD-10-CM

## 2012-06-19 DIAGNOSIS — N183 Chronic kidney disease, stage 3 unspecified: Secondary | ICD-10-CM | POA: Diagnosis present

## 2012-06-19 DIAGNOSIS — D72829 Elevated white blood cell count, unspecified: Secondary | ICD-10-CM

## 2012-06-19 DIAGNOSIS — F528 Other sexual dysfunction not due to a substance or known physiological condition: Secondary | ICD-10-CM

## 2012-06-19 DIAGNOSIS — J309 Allergic rhinitis, unspecified: Secondary | ICD-10-CM

## 2012-06-19 DIAGNOSIS — Z91199 Patient's noncompliance with other medical treatment and regimen due to unspecified reason: Secondary | ICD-10-CM

## 2012-06-19 DIAGNOSIS — R531 Weakness: Secondary | ICD-10-CM

## 2012-06-19 DIAGNOSIS — G9349 Other encephalopathy: Secondary | ICD-10-CM | POA: Diagnosis present

## 2012-06-19 DIAGNOSIS — Z9119 Patient's noncompliance with other medical treatment and regimen: Secondary | ICD-10-CM

## 2012-06-19 DIAGNOSIS — E870 Hyperosmolality and hypernatremia: Secondary | ICD-10-CM | POA: Diagnosis not present

## 2012-06-19 DIAGNOSIS — Z860100 Personal history of colon polyps, unspecified: Secondary | ICD-10-CM

## 2012-06-19 DIAGNOSIS — Z951 Presence of aortocoronary bypass graft: Secondary | ICD-10-CM

## 2012-06-19 DIAGNOSIS — M129 Arthropathy, unspecified: Secondary | ICD-10-CM

## 2012-06-19 DIAGNOSIS — Z8546 Personal history of malignant neoplasm of prostate: Secondary | ICD-10-CM

## 2012-06-19 DIAGNOSIS — I129 Hypertensive chronic kidney disease with stage 1 through stage 4 chronic kidney disease, or unspecified chronic kidney disease: Secondary | ICD-10-CM | POA: Diagnosis present

## 2012-06-19 DIAGNOSIS — E662 Morbid (severe) obesity with alveolar hypoventilation: Secondary | ICD-10-CM

## 2012-06-19 DIAGNOSIS — F17200 Nicotine dependence, unspecified, uncomplicated: Secondary | ICD-10-CM

## 2012-06-19 DIAGNOSIS — J441 Chronic obstructive pulmonary disease with (acute) exacerbation: Secondary | ICD-10-CM

## 2012-06-19 DIAGNOSIS — E872 Acidosis: Secondary | ICD-10-CM

## 2012-06-19 DIAGNOSIS — J9690 Respiratory failure, unspecified, unspecified whether with hypoxia or hypercapnia: Secondary | ICD-10-CM

## 2012-06-19 DIAGNOSIS — Y92009 Unspecified place in unspecified non-institutional (private) residence as the place of occurrence of the external cause: Secondary | ICD-10-CM

## 2012-06-19 DIAGNOSIS — E876 Hypokalemia: Secondary | ICD-10-CM | POA: Diagnosis not present

## 2012-06-19 DIAGNOSIS — D649 Anemia, unspecified: Secondary | ICD-10-CM

## 2012-06-19 DIAGNOSIS — E669 Obesity, unspecified: Secondary | ICD-10-CM

## 2012-06-19 DIAGNOSIS — K2980 Duodenitis without bleeding: Secondary | ICD-10-CM

## 2012-06-19 DIAGNOSIS — J449 Chronic obstructive pulmonary disease, unspecified: Secondary | ICD-10-CM

## 2012-06-19 DIAGNOSIS — K222 Esophageal obstruction: Secondary | ICD-10-CM

## 2012-06-19 DIAGNOSIS — E785 Hyperlipidemia, unspecified: Secondary | ICD-10-CM

## 2012-06-19 DIAGNOSIS — R569 Unspecified convulsions: Secondary | ICD-10-CM

## 2012-06-19 DIAGNOSIS — E1165 Type 2 diabetes mellitus with hyperglycemia: Secondary | ICD-10-CM | POA: Diagnosis present

## 2012-06-19 DIAGNOSIS — E119 Type 2 diabetes mellitus without complications: Secondary | ICD-10-CM

## 2012-06-19 DIAGNOSIS — K219 Gastro-esophageal reflux disease without esophagitis: Secondary | ICD-10-CM | POA: Diagnosis present

## 2012-06-19 DIAGNOSIS — E8729 Other acidosis: Secondary | ICD-10-CM

## 2012-06-19 DIAGNOSIS — Z6837 Body mass index (BMI) 37.0-37.9, adult: Secondary | ICD-10-CM

## 2012-06-19 DIAGNOSIS — F3289 Other specified depressive episodes: Secondary | ICD-10-CM

## 2012-06-19 DIAGNOSIS — N189 Chronic kidney disease, unspecified: Secondary | ICD-10-CM

## 2012-06-19 DIAGNOSIS — W010XXA Fall on same level from slipping, tripping and stumbling without subsequent striking against object, initial encounter: Secondary | ICD-10-CM | POA: Diagnosis present

## 2012-06-19 DIAGNOSIS — J4489 Other specified chronic obstructive pulmonary disease: Secondary | ICD-10-CM

## 2012-06-19 LAB — GLUCOSE, CAPILLARY
Glucose-Capillary: 131 mg/dL — ABNORMAL HIGH (ref 70–99)
Glucose-Capillary: 125 mg/dL — ABNORMAL HIGH (ref 70–99)

## 2012-06-19 LAB — URINALYSIS, ROUTINE W REFLEX MICROSCOPIC
Leukocytes, UA: NEGATIVE
Nitrite: NEGATIVE
Protein, ur: 100 mg/dL — AB
Specific Gravity, Urine: 1.018 (ref 1.005–1.030)
Urobilinogen, UA: 0.2 mg/dL (ref 0.0–1.0)

## 2012-06-19 LAB — CBC WITH DIFFERENTIAL/PLATELET
Basophils Absolute: 0 10*3/uL (ref 0.0–0.1)
Eosinophils Absolute: 0 10*3/uL (ref 0.0–0.7)
Hemoglobin: 13.2 g/dL (ref 13.0–17.0)
Lymphocytes Relative: 27 % (ref 12–46)
MCHC: 30 g/dL (ref 30.0–36.0)
Monocytes Relative: 8 % (ref 3–12)
Neutrophils Relative %: 65 % (ref 43–77)
RDW: 14.5 % (ref 11.5–15.5)
WBC: 14.4 10*3/uL — ABNORMAL HIGH (ref 4.0–10.5)

## 2012-06-19 LAB — TROPONIN I: Troponin I: <0.3 ng/mL (ref <0.30)

## 2012-06-19 LAB — APTT: aPTT: 38 seconds — ABNORMAL HIGH (ref 24–37)

## 2012-06-19 LAB — PROTIME-INR
INR: 1.01 (ref 0.00–1.49)
Prothrombin Time: 13.2 seconds (ref 11.6–15.2)

## 2012-06-19 MED ORDER — ETOMIDATE 2 MG/ML IV SOLN
INTRAVENOUS | Status: AC
  Administered 2012-06-19: 20 mg
  Filled 2012-06-19: qty 20

## 2012-06-19 MED ORDER — ERYTHROMYCIN 5 MG/GM OP OINT: TOPICAL_OINTMENT | Freq: Once | OPHTHALMIC | Status: DC

## 2012-06-19 MED ORDER — FENTANYL BOLUS VIA INFUSION
25.0000 ug | Freq: Four times a day (QID) | INTRAVENOUS | Status: DC | PRN
  Filled 2012-06-19: qty 100

## 2012-06-19 MED ORDER — MIDAZOLAM HCL 5 MG/ML IJ SOLN
1.0000 mg/h | INTRAMUSCULAR | Status: DC
  Administered 2012-06-19: 1 mg/h via INTRAVENOUS
  Administered 2012-06-20: 4 mg/h via INTRAVENOUS
  Filled 2012-06-19 (×2): qty 10

## 2012-06-19 MED ORDER — MIDAZOLAM HCL 2 MG/2ML IJ SOLN
INTRAMUSCULAR | Status: AC
  Administered 2012-06-19: 4 mg via INTRAVENOUS
  Filled 2012-06-19: qty 2

## 2012-06-19 MED ORDER — SUCCINYLCHOLINE CHLORIDE 20 MG/ML IJ SOLN
INTRAMUSCULAR | Status: AC
  Administered 2012-06-19: 20 mg
  Filled 2012-06-19: qty 5

## 2012-06-19 MED ORDER — ERYTHROMYCIN 5 MG/GM OP OINT: TOPICAL_OINTMENT | Freq: Every day | OPHTHALMIC | Status: DC

## 2012-06-19 MED ORDER — MIDAZOLAM HCL 2 MG/2ML IJ SOLN
4.0000 mg | Freq: Once | INTRAMUSCULAR | Status: AC
  Filled 2012-06-19: qty 2
  Filled 2012-06-19: qty 4

## 2012-06-19 MED ORDER — LIDOCAINE HCL (CARDIAC) 20 MG/ML IV SOLN
INTRAVENOUS | Status: AC
  Filled 2012-06-19: qty 5

## 2012-06-19 MED ORDER — ROCURONIUM BROMIDE 50 MG/5ML IV SOLN
INTRAVENOUS | Status: AC
  Filled 2012-06-19: qty 2

## 2012-06-19 MED ORDER — MIDAZOLAM BOLUS VIA INFUSION
1.0000 mg | INTRAVENOUS | Status: DC | PRN
  Filled 2012-06-19: qty 2

## 2012-06-19 MED ORDER — SODIUM CHLORIDE 0.9 % IV SOLN
25.0000 ug/h | INTRAVENOUS | Status: DC
  Administered 2012-06-19: 50 ug/h via INTRAVENOUS
  Administered 2012-06-20: 75 ug/h via INTRAVENOUS
  Administered 2012-06-20: 120 ug/h via INTRAVENOUS
  Administered 2012-06-20: 100 ug/h via INTRAVENOUS
  Administered 2012-06-21: 75 ug/h via INTRAVENOUS
  Filled 2012-06-19 (×2): qty 50

## 2012-06-19 NOTE — ED Provider Notes (Signed)
Medical screening examination/treatment/procedure(s) were conducted as a shared visit with non-physician practitioner(s) and myself.  I personally evaluated the patient during the encounter  Pt with decreasing mental status throughout the day today, history of hypercapnic respiratory failure. Has been intubated in the past, however most recent admission he was managed on BiPAP. ABG here shows marked hypercapnea with partially compensated respiratory acidosis. He is confused, does not follow commands. Discussed admission with sister who states 'do whatever you need to do'.   INTUBATION Performed by: Pollyann Savoy  Required items: required blood products, implants, devices, and special equipment available Patient identity confirmed: provided demographic data and hospital-assigned identification number Time out: Immediately prior to procedure a "time out" was called to verify the correct patient, procedure, equipment, support staff and site/side marked as required.  Indications: respiratory failure  Intubation method: Glidescope Laryngoscopy   Preoxygenation: BVM  Sedatives: Etomidate Paralytic: Succinylcholine  Tube Size: 8.0 cuffed  Post-procedure assessment: chest rise and ETCO2 monitor Breath sounds: equal and absent over the epigastrium Tube secured with: ETT holder Chest x-ray interpreted by radiologist and me.  Chest x-ray findings: endotracheal tube in appropriate position  Patient tolerated the procedure well with no immediate complications.   CRITICAL CARE Performed by: Pollyann Savoy   Total critical care time: 30  Critical care time was exclusive of separately billable procedures and treating other patients.  Critical care was necessary to treat or prevent imminent or life-threatening deterioration.  Critical care was time spent personally by me on the following activities: development of treatment plan with patient and/or surrogate as well as nursing,  discussions with consultants, evaluation of patient's response to treatment, examination of patient, obtaining history from patient or surrogate, ordering and performing treatments and interventions, ordering and review of laboratory studies, ordering and review of radiographic studies, pulse oximetry and re-evaluation of patient's condition.  Case discussed with Dr. Tyson Alias on call for PCCM who will accept the patient to the ICU.   Bruna Dills B. Bernette Mayers, MD 06/19/12 2243

## 2012-06-19 NOTE — ED Provider Notes (Signed)
History     CSN: [id number]  Arrival date & time 06/19/12  6578   First MD Initiated Contact with Patient 06/19/12 2033      Chief Complaint  Patient presents with  . Fall  . Hyperglycemia    (Consider location/radiation/quality/duration/timing/severity/associated sxs/prior treatment) HPI Comments: Patient with COPD and DM has not been eating or drinking for the past 2 days has had  several falls from standing is less responsive than normal  Has not been using his BiPap at night has not been elevating his legs and it is unknown if he is taking his medications.  Patient lives alone and siblings check on him daily as well as home health nurse.  Patient is a 72 y.o. male presenting with fall. The history is provided by the patient and a relative.  Fall The accident occurred 2 days ago. He landed on a hard floor. There was no blood loss. Associated symptoms include vomiting.    Past Medical History  Diagnosis Date  . Renal insufficiency   . Diabetes mellitus   . GERD (gastroesophageal reflux disease)   . Gastroparesis   . Esophageal stricture   . Hyperlipemia   . Hypertension   . Pancreatitis   . Chronic respiratory disease   . Depression   . Seizure disorder   . Arthritis   . Diverticulosis   . Sleep apnea     CPAP Machine  . Obesity   . ED (erectile dysfunction)   . Sleep apnea   . COPD (chronic obstructive pulmonary disease)     Gold Stage  III/IV  . H/O polycythemia   . Chronic kidney disease (CKD), stage III (moderate)   . DDD (degenerative disc disease), lumbar   . History of esophageal stricture   . Erectile dysfunction   . Prostate cancer   . Peptic ulcer disease   . History of colon polyps   . Gout   . Gastroparesis     Past Surgical History  Procedure Laterality Date  . Prostate surgery    . Coronary artery bypass graft      x 4  . Bone marrow biopsy    . Cardiac catheterization  05/02/2008  . Back surgery      lumbar    Family History  Problem  Relation Age of Onset  . Colon cancer Neg Hx   . Cancer Brother     unsure what kind  . Heart disease Mother   . Heart disease Father   . Emphysema Father     History  Substance Use Topics  . Smoking status: Former Smoker -- 1.00 packs/day for 60 years    Types: Cigarettes    Quit date: 05/12/2012  . Smokeless tobacco: Never Used  . Alcohol Use: No     Comment: former heavy drinker      Review of Systems  Constitutional: Positive for activity change and appetite change. Negative for diaphoresis and unexpected weight change.  Eyes: Positive for redness.  Respiratory: Positive for shortness of breath. Negative for cough.   Cardiovascular: Positive for leg swelling.  Gastrointestinal: Positive for vomiting.  Neurological: Positive for dizziness and weakness.  All other systems reviewed and are negative.    Allergies  Review of patient's allergies indicates no known allergies.  Home Medications   Current Outpatient Rx  Name  Route  Sig  Dispense  Refill  . acetaminophen (TYLENOL) 500 MG tablet   Oral   Take 500 mg by mouth every 6 (six)  hours as needed for pain ("per bottle").         . albuterol (PROVENTIL) (2.5 MG/3ML) 0.083% nebulizer solution   Nebulization   Take 5 mg by nebulization every 4 (four) hours as needed for wheezing or shortness of breath. Dx 496         . bisoprolol (ZEBETA) 5 MG tablet   Oral   Take 1 tablet (5 mg total) by mouth 2 (two) times daily.   60 tablet   4   . budesonide (PULMICORT) 0.25 MG/2ML nebulizer solution   Nebulization   Take 0.25 mg by nebulization 2 (two) times daily.         . calcitRIOL (ROCALTROL) 0.25 MCG capsule   Oral   Take 1 capsule (0.25 mcg total) by mouth daily.   30 capsule   4   . furosemide (LASIX) 40 MG tablet   Oral   Take 80-120 mg by mouth 2 (two) times daily. Take 3 tablet - 120 mg daily in am an 2 tab 80 mg in evening         . insulin aspart (NOVOLOG) 100 UNIT/ML injection    Subcutaneous   Inject 0-149 Units into the skin as directed. Per Sliding Scale   3 pen   4   . insulin glargine (LANTUS) 100 UNIT/ML injection   Subcutaneous   Inject 10 Units into the skin 2 (two) times daily.         Marland Kitchen ipratropium (ATROVENT) 0.02 % nebulizer solution   Nebulization   Take 2.5 mLs (0.5 mg total) by nebulization 4 (four) times daily. DX: 496   300 mL   5     496   . pantoprazole (PROTONIX) 40 MG tablet   Oral   Take 40 mg by mouth daily.         . paricalcitol (ZEMPLAR) 2 MCG capsule   Oral   Take 2 mcg by mouth every other day.         . simvastatin (ZOCOR) 40 MG tablet   Oral   Take 40 mg by mouth at bedtime. Takes at bedtime.           BP 153/66  Pulse 86  Temp(Src) 98.3 F (36.8 C) (Oral)  Resp 20  SpO2 91%  Physical Exam  Nursing note and vitals reviewed. Constitutional: He is oriented to person, place, and time. He appears well-developed and well-nourished.  HENT:  Head: Normocephalic.  Eyes: Pupils are equal, round, and reactive to light. Left eye exhibits discharge. Left conjunctiva is injected.  Neck: Normal range of motion.  Cardiovascular: Normal rate and regular rhythm.   Pulmonary/Chest: Effort normal.  Abdominal: Soft. Bowel sounds are normal. He exhibits no distension. There is no tenderness.  Musculoskeletal: Normal range of motion. He exhibits edema. He exhibits no tenderness.  Neurological: He is alert and oriented to person, place, and time.  Skin: Skin is warm.    ED Course  CRITICAL CARE Performed by: Arman Filter Authorized by: Arman Filter Total critical care time: 30 minutes Critical care was necessary to treat or prevent imminent or life-threatening deterioration of the following conditions: CNS failure or compromise. Critical care was time spent personally by me on the following activities: development of treatment plan with patient or surrogate, discussions with consultants, evaluation of patient's  response to treatment, examination of patient, obtaining history from patient or surrogate, ordering and performing treatments and interventions, ordering and review of laboratory studies, ordering and review  of radiographic studies, pulse oximetry, re-evaluation of patient's condition and review of old charts.   (including critical care time)  Labs Reviewed  GLUCOSE, CAPILLARY - Abnormal; Notable for the following:    Glucose-Capillary 131 (*)    All other components within normal limits  GLUCOSE, CAPILLARY - Abnormal; Notable for the following:    Glucose-Capillary 125 (*)    All other components within normal limits  CBC WITH DIFFERENTIAL  URINALYSIS, ROUTINE W REFLEX MICROSCOPIC  BLOOD GAS, ARTERIAL   No results found.   No diagnosis found.    MDM   Patient condition has stabilized frequent reassessment additional lab orders, restraint orders, bot chemical and physical to protect airway         Arman Filter, NP 06/19/12 2325

## 2012-06-19 NOTE — Telephone Encounter (Signed)
SISTER CALLED AND SAID PT IS NOT EATING OR DRINKING STAYING CONFUSED DOESN'T KNOW HOW TO TAKE MEDS AND KEEPS FALLING RENE FROM [facility] TOLD HER TO CALL YOU TO GET ADVISE

## 2012-06-19 NOTE — Telephone Encounter (Signed)
Renee with [facility]..[phone] went out to visit with pt and noticed that he may have pink eye, it is matted,draining. He has also fell twice today and his O2 stats are in the low 90's and his blood sugars are staying in the 200's. She wasn't sure if you wanted him to come in to be seen. If you send in a rx for possible pink eye send to [facility]

## 2012-06-19 NOTE — ED Notes (Signed)
Pt reports he got dizzy and fell x 2 today- states his blood sugar has been elevated and he also has pinkeye

## 2012-06-19 NOTE — Telephone Encounter (Signed)
Call in erythromycin ointment. Check on the allergy

## 2012-06-19 NOTE — Progress Notes (Signed)
Report obtained from Alinda Money (ED Nurse).

## 2012-06-19 NOTE — Telephone Encounter (Signed)
Called sister informed him word for word and she said if he didn't want to go to er she couldn't make him

## 2012-06-19 NOTE — Telephone Encounter (Signed)
This is exactly why he needs to be in assisted living. If she is concerned about his symptoms then have her taken to the emergency room for evaluation

## 2012-06-19 NOTE — Telephone Encounter (Signed)
SENT IN MED  

## 2012-06-20 ENCOUNTER — Inpatient Hospital Stay (HOSPITAL_COMMUNITY): Payer: Medicare Other

## 2012-06-20 DIAGNOSIS — E119 Type 2 diabetes mellitus without complications: Secondary | ICD-10-CM

## 2012-06-20 DIAGNOSIS — G9340 Encephalopathy, unspecified: Secondary | ICD-10-CM

## 2012-06-20 HISTORY — DX: Encephalopathy, unspecified: G93.40

## 2012-06-20 LAB — BLOOD GAS, ARTERIAL
Bicarbonate: 44.7 mEq/L — ABNORMAL HIGH (ref 20.0–24.0)
Drawn by: 244901
FIO2: 0.21 %
MECHVT: 500 mL
O2 Saturation: 99.3 %
PEEP: 5 cmH2O
Patient temperature: 37.2
TCO2: 39.6 mmol/L (ref 0–100)
pH, Arterial: 7.488 — ABNORMAL HIGH (ref 7.350–7.450)

## 2012-06-20 LAB — GLUCOSE, CAPILLARY
Glucose-Capillary: 177 mg/dL — ABNORMAL HIGH (ref 70–99)
Glucose-Capillary: 185 mg/dL — ABNORMAL HIGH (ref 70–99)

## 2012-06-20 LAB — COMPREHENSIVE METABOLIC PANEL
ALT: 6 U/L (ref 0–53)
BUN: 30 mg/dL — ABNORMAL HIGH (ref 6–23)
CO2: 43 mEq/L (ref 19–32)
Calcium: 8.5 mg/dL (ref 8.4–10.5)
GFR calc Af Amer: 44 mL/min — ABNORMAL LOW (ref >90)
GFR calc non Af Amer: 38 mL/min — ABNORMAL LOW (ref >90)
Glucose, Bld: 128 mg/dL — ABNORMAL HIGH (ref 70–99)
Sodium: 146 mEq/L — ABNORMAL HIGH (ref 135–145)
Total Protein: 5.7 g/dL — ABNORMAL LOW (ref 6.0–8.3)

## 2012-06-20 MED ORDER — LEVOFLOXACIN IN D5W 750 MG/150ML IV SOLN
750.0000 mg | INTRAVENOUS | Status: DC
  Administered 2012-06-20 (×2): 750 mg via INTRAVENOUS
  Filled 2012-06-20 (×3): qty 150

## 2012-06-20 MED ORDER — ALBUTEROL SULFATE HFA 108 (90 BASE) MCG/ACT IN AERS
6.0000 | INHALATION_SPRAY | RESPIRATORY_TRACT | Status: DC
  Administered 2012-06-20 – 2012-06-21 (×7): 6 via RESPIRATORY_TRACT
  Filled 2012-06-20: qty 6.7

## 2012-06-20 MED ORDER — HEPARIN SODIUM (PORCINE) 5000 UNIT/ML IJ SOLN: 5000.0000 [IU] | Freq: Three times a day (TID) | INTRAMUSCULAR | Status: DC

## 2012-06-20 MED ORDER — PROMOTE PO LIQD
1000.0000 mL | ORAL | Status: DC
  Administered 2012-06-20: 1000 mL
  Filled 2012-06-20 (×2): qty 1000

## 2012-06-20 MED ORDER — PRO-STAT SUGAR FREE PO LIQD
30.0000 mL | Freq: Every day | ORAL | Status: DC
  Administered 2012-06-20 – 2012-06-21 (×5): 30 mL
  Filled 2012-06-20 (×10): qty 30

## 2012-06-20 MED ORDER — METHYLPREDNISOLONE SODIUM SUCC 40 MG IJ SOLR
40.0000 mg | Freq: Two times a day (BID) | INTRAMUSCULAR | Status: DC
  Administered 2012-06-20 – 2012-06-22 (×4): 40 mg via INTRAVENOUS
  Filled 2012-06-20 (×6): qty 1

## 2012-06-20 MED ORDER — ADULT MULTIVITAMIN LIQUID CH
5.0000 mL | Freq: Every day | ORAL | Status: DC
  Administered 2012-06-20: 5 mL
  Administered 2012-06-21: 1 mL
  Filled 2012-06-20 (×2): qty 5

## 2012-06-20 MED ORDER — FREE WATER
200.0000 mL | Freq: Four times a day (QID) | Status: DC
  Administered 2012-06-20 – 2012-06-21 (×4): 200 mL

## 2012-06-20 MED ORDER — INSULIN ASPART 100 UNIT/ML ~~LOC~~ SOLN
0.0000 [IU] | SUBCUTANEOUS | Status: DC
  Administered 2012-06-20 (×3): 3 [IU] via SUBCUTANEOUS
  Administered 2012-06-20 (×2): 5 [IU] via SUBCUTANEOUS
  Administered 2012-06-21: 3 [IU] via SUBCUTANEOUS
  Administered 2012-06-21 (×5): 5 [IU] via SUBCUTANEOUS
  Administered 2012-06-21: 2 [IU] via SUBCUTANEOUS
  Administered 2012-06-22 (×2): 5 [IU] via SUBCUTANEOUS
  Administered 2012-06-22: 3 [IU] via SUBCUTANEOUS

## 2012-06-20 MED ORDER — SODIUM CHLORIDE 0.9 % IV SOLN: INTRAVENOUS | Status: DC

## 2012-06-20 MED ORDER — PANTOPRAZOLE SODIUM 40 MG IV SOLR
40.0000 mg | INTRAVENOUS | Status: DC
  Administered 2012-06-20 (×2): 40 mg via INTRAVENOUS
  Filled 2012-06-20 (×3): qty 40

## 2012-06-20 MED ORDER — CHLORHEXIDINE GLUCONATE 0.12 % MT SOLN
15.0000 mL | Freq: Two times a day (BID) | OROMUCOSAL | Status: DC
  Administered 2012-06-20 (×3): 15 mL via OROMUCOSAL
  Filled 2012-06-20 (×3): qty 15

## 2012-06-20 MED ORDER — MUPIROCIN 2 % EX OINT
1.0000 "application " | TOPICAL_OINTMENT | Freq: Two times a day (BID) | CUTANEOUS | Status: DC
  Administered 2012-06-20 – 2012-06-23 (×8): 1 via NASAL
  Filled 2012-06-20: qty 22

## 2012-06-20 MED ORDER — BIOTENE DRY MOUTH MT LIQD
1.0000 "application " | Freq: Four times a day (QID) | OROMUCOSAL | Status: DC
  Administered 2012-06-20 – 2012-06-23 (×12): 15 mL via OROMUCOSAL

## 2012-06-20 MED ORDER — METHYLPREDNISOLONE SODIUM SUCC 125 MG IJ SOLR
80.0000 mg | Freq: Two times a day (BID) | INTRAMUSCULAR | Status: DC
  Administered 2012-06-20 (×2): 80 mg via INTRAVENOUS
  Filled 2012-06-20: qty 1.28
  Filled 2012-06-20: qty 2
  Filled 2012-06-20 (×2): qty 1.28

## 2012-06-20 MED ORDER — CHLORHEXIDINE GLUCONATE CLOTH 2 % EX PADS
6.0000 | MEDICATED_PAD | Freq: Every day | CUTANEOUS | Status: DC
  Administered 2012-06-20 – 2012-06-23 (×4): 6 via TOPICAL

## 2012-06-20 MED ORDER — ALBUTEROL SULFATE HFA 108 (90 BASE) MCG/ACT IN AERS: 6.0000 | INHALATION_SPRAY | RESPIRATORY_TRACT | Status: DC | PRN

## 2012-06-20 MED ORDER — ALBUTEROL SULFATE HFA 108 (90 BASE) MCG/ACT IN AERS
6.0000 | INHALATION_SPRAY | RESPIRATORY_TRACT | Status: DC
  Administered 2012-06-20 (×2): 6 via RESPIRATORY_TRACT
  Filled 2012-06-20: qty 6.7

## 2012-06-20 MED ORDER — PRO-STAT SUGAR FREE PO LIQD
30.0000 mL | Freq: Two times a day (BID) | ORAL | Status: AC
  Administered 2012-06-20: 30 mL
  Filled 2012-06-20: qty 30

## 2012-06-20 MED ORDER — IPRATROPIUM BROMIDE HFA 17 MCG/ACT IN AERS
6.0000 | INHALATION_SPRAY | RESPIRATORY_TRACT | Status: DC
  Administered 2012-06-20 (×2): 6 via RESPIRATORY_TRACT
  Filled 2012-06-20: qty 12.9

## 2012-06-20 MED ORDER — IPRATROPIUM BROMIDE HFA 17 MCG/ACT IN AERS
6.0000 | INHALATION_SPRAY | Freq: Four times a day (QID) | RESPIRATORY_TRACT | Status: DC
  Administered 2012-06-20 – 2012-06-21 (×4): 6 via RESPIRATORY_TRACT
  Filled 2012-06-20: qty 12.9

## 2012-06-20 MED ORDER — SODIUM CHLORIDE 0.9 % IV SOLN
INTRAVENOUS | Status: DC
  Administered 2012-06-20 – 2012-06-21 (×2): 1000 mL via INTRAVENOUS
  Administered 2012-06-22: 20:00:00 via INTRAVENOUS

## 2012-06-20 MED ORDER — PROPOFOL 10 MG/ML IV EMUL
5.0000 ug/kg/min | INTRAVENOUS | Status: DC
  Administered 2012-06-20: 15 ug/kg/min via INTRAVENOUS
  Administered 2012-06-20 – 2012-06-21 (×2): 20 ug/kg/min via INTRAVENOUS
  Filled 2012-06-20 (×3): qty 100

## 2012-06-20 NOTE — H&P (Signed)
PULMONARY  / CRITICAL CARE MEDICINE  Name: [name] [name] MRN: [id number] DOB: 1940-04-09    ADMISSION DATE:  06/19/2012 CONSULTATION DATE:  06/19/2012  REFERRING MD :  EDP PRIMARY SERVICE:  PCCM  CHIEF COMPLAINT:  Acute on chronic hypercarbic respiratory failure  BRIEF PATIENT DESCRIPTION: 72 yo with COPD, OSA/OHS intubated for acute on chronic hypercarbic respiratory failure.  SIGNIFICANT EVENTS / STUDIES:  4/21  Intubated for hypercarbic respiratory failure 4/21  Head CT >>> nad  LINES / TUBES: OETT 4/21 >>> OGT  4/21 >>> Foley  4/21 >>> L [place] CVL  4/21 >>>  CULTURES: 4/21  Blood >>> 4/21  Respiratory >>>  ANTIBIOTICS: Levaquin 4/22 >>>  The patient is encephalopathic and unable to provide history, which was obtained for available medical records.  HISTORY OF PRESENT ILLNESS:  72 yo with COPD, OSA/OHS intubated for acute on chronic hypercarbic respiratory failure.  PAST MEDICAL HISTORY :  Past Medical History  Diagnosis Date  . Renal insufficiency   . Diabetes mellitus   . GERD (gastroesophageal reflux disease)   . Gastroparesis   . Esophageal stricture   . Hyperlipemia   . Hypertension   . Pancreatitis   . Chronic respiratory disease   . Depression   . Seizure disorder   . Arthritis   . Diverticulosis   . Sleep apnea     CPAP Machine  . Obesity   . ED (erectile dysfunction)   . Sleep apnea   . COPD (chronic obstructive pulmonary disease)     Gold Stage  III/IV  . H/O polycythemia   . Chronic kidney disease (CKD), stage III (moderate)   . DDD (degenerative disc disease), lumbar   . History of esophageal stricture   . Erectile dysfunction   . Prostate cancer   . Peptic ulcer disease   . History of colon polyps   . Gout   . Gastroparesis    Past Surgical History  Procedure Laterality Date  . Prostate surgery    . Coronary artery bypass graft      x 4  . Bone marrow biopsy    . Cardiac catheterization  05/02/2008  . Back surgery      lumbar    Prior to Admission medications   Medication Sig Start Date End Date Taking? Authorizing Provider  acetaminophen (TYLENOL) 500 MG tablet Take 500 mg by mouth every 6 (six) hours as needed for pain ("per bottle").   Yes Historical Provider, MD  albuterol (PROVENTIL) (2.5 MG/3ML) 0.083% nebulizer solution Take 5 mg by nebulization every 4 (four) hours as needed for wheezing or shortness of breath. Dx 496 04/25/12  Yes Ronnald Nian, MD  bisoprolol (ZEBETA) 5 MG tablet Take 1 tablet (5 mg total) by mouth 2 (two) times daily. 12/24/11 12/23/12 Yes Ronnald Nian, MD  budesonide (PULMICORT) 0.25 MG/2ML nebulizer solution Take 0.25 mg by nebulization 2 (two) times daily.   Yes Historical Provider, MD  calcitRIOL (ROCALTROL) 0.25 MCG capsule Take 1 capsule (0.25 mcg total) by mouth daily. 12/24/11  Yes Ronnald Nian, MD  furosemide (LASIX) 40 MG tablet Take 80-120 mg by mouth 2 (two) times daily. Take 3 tablet - 120 mg daily in am an 2 tab 80 mg in evening 05/16/12  Yes Zannie Cove, MD  insulin aspart (NOVOLOG) 100 UNIT/ML injection Inject 0-149 Units into the skin as directed. Per Sliding Scale 06/12/12  Yes Ronnald Nian, MD  insulin glargine (LANTUS) 100 UNIT/ML injection Inject 10 Units into  the skin 2 (two) times daily. 12/28/10  Yes Ronnald Nian, MD  ipratropium (ATROVENT) 0.02 % nebulizer solution Take 2.5 mLs (0.5 mg total) by nebulization 4 (four) times daily. DX: 496 04/25/12 04/25/13 Yes Ronnald Nian, MD  pantoprazole (PROTONIX) 40 MG tablet Take 40 mg by mouth daily.   Yes Historical Provider, MD  paricalcitol (ZEMPLAR) 2 MCG capsule Take 2 mcg by mouth every other day.   Yes Historical Provider, MD  simvastatin (ZOCOR) 40 MG tablet Take 40 mg by mouth at bedtime. Takes at bedtime.   Yes Historical Provider, MD   No Known Allergies  FAMILY HISTORY:  Family History  Problem Relation Age of Onset  . Colon cancer Neg Hx   . Cancer Brother     unsure what kind  . Heart disease Mother    . Heart disease Father   . Emphysema Father    SOCIAL HISTORY:  reports that he quit smoking about 5 weeks ago. His smoking use included Cigarettes. He has a 60 pack-year smoking history. He has never used smokeless tobacco. He reports that he does not drink alcohol or use illicit drugs.  REVIEW OF SYSTEMS:  Unable to provide.  INTERVAL HISTORY:  VITAL SIGNS: Temp:  [97.1 F (36.2 C)-98.3 F (36.8 C)] 98 F (36.7 C) (04/21 2330) Pulse Rate:  [75-94] 75 (04/21 2330) Resp:  [15-34] 24 (04/21 2330) BP: (93-192)/(55-100) 93/64 mmHg (04/21 2330) SpO2:  [91 %-100 %] 100 % (04/21 2330) FiO2 (%):  [60 %] 60 % (04/21 2224)  HEMODYNAMICS:   VENTILATOR SETTINGS: Vent Mode:  [-] PRVC FiO2 (%):  [60 %] 60 % Set Rate:  [24 bmp] 24 bmp Vt Set:  [500 mL] 500 mL PEEP:  [5 cmH20] 5 cmH20 Plateau Pressure:  [25 cmH20] 25 cmH20 INTAKE / OUTPUT: Intake/Output   None    PHYSICAL EXAMINATION: General:  Appears acutely ill, mechanically ventilated, synchronous, high PIPs Neuro:  Encephalopathic, nonfocal, cough / gag diminished HEENT:  PERRL, OETT / OGT Cardiovascular:  RRR, no m/r/g Lungs:  Bilateral diminished air entry, few exp wheezes Abdomen:  Obese, soft, nontender, bowel sounds diminished Musculoskeletal:  No edema, venous stasis dermatitis BL LE Skin:  Intact  LABS:  Recent Labs Lab 06/19/12 2149 06/19/12 2205 06/19/12 2302  HGB  --  13.2  --   WBC  --  14.4*  --   PLT  --  65*  --   APTT  --   --  38*  INR  --   --  1.01  TROPONINI  --   --  <0.30  PHART 7.191*  --   --   PCO2ART OUTSIDE REPORTABLE RANGE  --   --   PO2ART 78.1*  --   --     Recent Labs Lab 06/19/12 1940 06/19/12 2110  GLUCAP 131* 125*   CXR:  4/21 >>> ETT in place, no overt airspace disease  ASSESSMENT / PLAN:  PULMONARY A:  Acute on chronic respiratory failure.  OSA / OHS, noncompliant with home CPAP.  COPD with exacerbation.  No overt pneumonia. P:   Gaol SpO2>92, pH>7.30 Full  mechanical support Daily SBT Trend ABG / CXR Atrovent / Albuterol Solu-Medrol  CARDIOVASCULAR A: HTN.  Dyslipidemia. P:  Goal MAP>60 Bisoprolol, Zocor when able  RENAL A:  CKD. P:   Goal CVP>10 BMP pending Hold Lasix NS@100  Zemplar when able  GASTROINTESTINAL A:  No active isses. P:   NPO as intubated TF if remains intubated >  24 hours Protonix for GI Px LFTs pending  HEMATOLOGIC A:  Thrombocytopenia. P:  Trend CBC SCDs for DVT Px  INFECTIOUS A:  Acute bronchitis. P:   Cultures and antibiotics as above  ENDOCRINE  A:  DM2. P:   SSI Hold Lantus  NEUROLOGIC A:  Acute encephalopathy. P:   Goal RASS 0 to -1 Fentanyl / Versed gtt  TODAY'S SUMMARY: 72 yo with COPD, OSA/OHS intubated for acute on chronic hypercarbic respiratory failure. Thrombocytopenic. Tonight: full mechanical support, steroids, bronchodilators, empirical Levaquin.  eLink: follow labs.  I have personally obtained a history, examined the patient, evaluated laboratory and imaging results, formulated the assessment and plan and placed orders.  CRITICAL CARE:  The patient is critically ill with multiple organ systems failure and requires high complexity decision making for assessment and support, frequent evaluation and titration of therapies, application of advanced monitoring technologies and extensive interpretation of multiple databases. Critical Care Time devoted to patient care services described in this note is 60 minutes.   Lonia Farber, MD Pulmonary and Critical Care Medicine [facility] Pager: [phone]  06/20/2012, 12:35 AM

## 2012-06-20 NOTE — Clinical Social Work Note (Signed)
CSW reviewed chart and spoke with RN. Pt was discharged to a SNF after last admission, 05/12/12. Pt was recently discharged from SNF to home on 06/01/12. Pt currently on vent, no family present currently. CSW will follow for possible d/c needs and reach to family.   Doreen Salvage, LCSW ICU/Stepdown Clinical Social Worker [facility] Cell [phone] Hours 8am-1200pm M-F

## 2012-06-20 NOTE — Procedures (Signed)
Name:  [name] MRN:  [id number] DOB:  Feb 21, 1941  PROCEDURE NOTE  Procedure:  Central venous catheter placement.  Indications:  Need for intravenous access and hemodynamic monitoring.  Consent:  Consent was implied due to the emergency nature of the procedure.  Anesthesia:  A total of 10 mL of 1% Lidocaine was used for local infiltration anesthesia.  Procedure summary:  Appropriate equipment was assembled.  The patient was identified as [name] [name] and safety timeout was performed. The patient was placed in Trendelenburg position.  Sterile technique was used. The patient's left anterior chest wall was prepped using chlorhexidine / alcohol scrub and the field was draped in usual sterile fashion with full body drape. After the adequate anesthesia was achieved, the left subclavian vein was cannulated with the introducer needle without difficulty. A guide wire was advanced through the introducer needle, which was then withdrawn. A small skin incision was made at the point of wire entry, the dilator was inserted over the guide wire and appropriate dilation was obtained. The dilator was removed and triple-lumen catheter was advanced over the guide wire, which was then removed.  All ports were aspirated and flushed with normal saline without difficulty. The catheter was secured into place. Antibiotic patch was placed and sterile dressing was applied. Post-procedure chest x-ray was ordered.  Complications:  No immediate complications were noted.  Hemodynamic parameters and oxygenation remained stable throughout the procedure.  Estimated blood loss:  Less then 5 mL.  Orlean Bradford, M.D. Pulmonary and Critical Care Medicine [facility] Cell: [phone]  06/20/2012, 12:29 AM

## 2012-06-20 NOTE — Progress Notes (Signed)
CRITICAL VALUE ALERT  Critical value received:  CO2 = 43  Date of notification:  06/20/12  Time of notification:  02:46  Critical value read back:yes  Nurse who received alert:  Guinevere Scarlet, RN  MD notified (1st page):  Pola Corn, Dr. Darrick Penna   Time of first page:  02:46  MD notified (2nd page):  Time of second page:  Responding MD:  Pola Corn, Dr. Darrick Penna   Time MD responded:  02:46

## 2012-06-20 NOTE — Progress Notes (Addendum)
INITIAL NUTRITION ASSESSMENT  DOCUMENTATION CODES Per approved criteria  -Obesity Unspecified   INTERVENTION: Recommend initiating tube feeds: Initiate Promote @ 10 ml/hr via OG tube and increase by 10 ml every 4 hours to goal rate of 20 ml/hr. 30 ml Prostat 7 times daily.  At goal rate, tube feeding regimen will provide 1180 kcal, 135 grams of protein, and 403 ml of H2O.  Propofol is currently providing 261 kcal per day. TF plus propofol will provide 1441 kcal and 135 grams of protein. Provide additional 200 ml free water flushes q 6 hours.  This TF regimen will meet 73% of estimated calorie needs and 100% of estimated protein needs.  Provide daily Multivitamin due to tube feed <1 L/day  NUTRITION DIAGNOSIS: Inadequate oral intake related to inability to eat as evidenced by pt intubated with NPO status.   Goal: Enteral nutrition to provide 60-70% of estimated calorie needs (22-25 kcals/kg ideal body weight) and 100% of estimated protein needs, based on ASPEN guidelines for permissive underfeeding in critically ill obese individuals.   Monitor:  Vent status TF initiation/rate/tolerance Propofol rate Wt Labs  Reason for Assessment: Consult for TF initiation and management  72 y.o. male  Admitting Dx: Acute on chronic hypercarbic respiratory failure  ASSESSMENT: 72 yo with COPD, OSA/OHS intubated for acute on chronic hypercarbic respiratory failure. Pt has history of diabetes, GERD, gastroparesis, and esophageal stricture. No family present in pt's room at time of visit. Pt appears well-nourished. Pt has OG tube in place for suction. Pt now on propofol at 9.9 ml/hr- rate likely to be increased per RN.    Height: Ht Readings from Last 1 Encounters:  06/20/12 5\' 7"  (1.702 m)    Weight: Wt Readings from Last 1 Encounters:  06/20/12 241 lb 6.5 oz (109.5 kg)    Ideal Body Weight: 148 lbs  % Ideal Body Weight: 163%  Wt Readings from Last 10 Encounters:  06/20/12 241 lb  6.5 oz (109.5 kg)  06/13/12 240 lb 12.8 oz (109.226 kg)  06/06/12 244 lb (110.678 kg)  06/01/12 239 lb (108.41 kg)  05/30/12 243 lb 3.2 oz (110.315 kg)  05/19/12 239 lb (108.41 kg)  05/15/12 243 lb 6.2 oz (110.4 kg)  04/25/12 246 lb (111.585 kg)  09/10/11 251 lb (113.853 kg)  08/30/11 258 lb (117.028 kg)    Usual Body Weight: unknown  % Usual Body Weight: NA  BMI:  Body mass index is 37.8 kg/(m^2).  Patient is currently intubated on ventilator support.  MV: 7.8 Temp:Temp (24hrs), Avg:98.2 F (36.8 C), Min:97.1 F (36.2 C), Max:99 F (37.2 C)  Propofol: 9.9 ml/hr (providing 261 kcal daily)  Estimated Nutritional Needs: Kcal: 1968 Protein: 135 grams Fluid: 2.8 L  Skin: intact;  +1 RLE and LLE edema  Diet Order: NPO  EDUCATION NEEDS: -No education needs identified at this time   Intake/Output Summary (Last 24 hours) at 06/20/12 1056 Last data filed at 06/20/12 1000  Gross per 24 hour  Intake 1283.25 ml  Output    450 ml  Net 833.25 ml    Last BM: PTA  Labs:   Recent Labs Lab 06/20/12 0152  NA 146*  K 3.4*  CL 101  CO2 43*  BUN 30*  CREATININE 1.71*  CALCIUM 8.5  MG 1.5  PHOS 2.2*  GLUCOSE 128*    CBG (last 3)   Recent Labs  06/19/12 2110 06/20/12 0311 06/20/12 0838  GLUCAP 125* 135* 177*    Scheduled Meds: . sodium chloride  Intravenous STAT  . albuterol  6 puff Inhalation Q4H  . antiseptic oral rinse  1 application Mouth Rinse QID  . chlorhexidine  15 mL Mouth/Throat BID  . Chlorhexidine Gluconate Cloth  6 each Topical Q0600  . erythromycin   Left Eye Once  . insulin aspart  0-15 Units Subcutaneous Q4H  . ipratropium  6 puff Inhalation Q4H  . levofloxacin (LEVAQUIN) IV  750 mg Intravenous Q24H  . methylPREDNISolone (SOLU-MEDROL) injection  80 mg Intravenous Q12H  . mupirocin ointment  1 application Nasal BID  . pantoprazole (PROTONIX) IV  40 mg Intravenous Q24H    Continuous Infusions: . sodium chloride 1,000 mL (06/20/12  0031)  . fentaNYL infusion INTRAVENOUS 100 mcg/hr (06/20/12 0830)  . midazolam (VERSED) infusion 4 mg/hr (06/20/12 0830)    Past Medical History  Diagnosis Date  . Renal insufficiency   . Diabetes mellitus   . GERD (gastroesophageal reflux disease)   . Gastroparesis   . Esophageal stricture   . Hyperlipemia   . Hypertension   . Pancreatitis   . Chronic respiratory disease   . Depression   . Seizure disorder   . Arthritis   . Diverticulosis   . Sleep apnea     CPAP Machine  . Obesity   . ED (erectile dysfunction)   . Sleep apnea   . COPD (chronic obstructive pulmonary disease)     Gold Stage  III/IV  . H/O polycythemia   . Chronic kidney disease (CKD), stage III (moderate)   . DDD (degenerative disc disease), lumbar   . History of esophageal stricture   . Erectile dysfunction   . Prostate cancer   . Peptic ulcer disease   . History of colon polyps   . Gout   . Gastroparesis     Past Surgical History  Procedure Laterality Date  . Prostate surgery    . Coronary artery bypass graft      x 4  . Bone marrow biopsy    . Cardiac catheterization  05/02/2008  . Back surgery      lumbar    Ian Malkin RD, LDN Inpatient Clinical Dietitian Pager: [phone] After Hours Pager: [phone]

## 2012-06-20 NOTE — Progress Notes (Signed)
Agitation   Cont restrains

## 2012-06-20 NOTE — Progress Notes (Signed)
PULMONARY  / CRITICAL CARE MEDICINE  Name: [name] [name] MRN: [id number] DOB: Mar 15, 1940    ADMISSION DATE:  06/19/2012 CONSULTATION DATE:  06/19/2012  REFERRING MD :  EDP PRIMARY SERVICE:  PCCM   BRIEF PATIENT DESCRIPTION: 72 yo with COPD, OSA/OHS intubated for acute on chronic hypercarbic respiratory failure.  SIGNIFICANT EVENTS / STUDIES:  4/21  Intubated for hypercarbic respiratory failure 4/21  Head CT >>> nad  LINES / TUBES: OETT 4/21 >>> OGT  4/21 >>> L [place] CVL  4/21 >>>  CULTURES: 4/21  Blood >>> 4/21  Respiratory >>>  ANTIBIOTICS: Levaquin 4/22 >>>  VITAL SIGNS: Temp:  [97.1 F (36.2 C)-99 F (37.2 C)] 98.1 F (36.7 C) (04/22 1000) Pulse Rate:  [70-94] 72 (04/22 1000) Resp:  [14-34] 14 (04/22 1000) BP: (81-192)/(55-100) 148/69 mmHg (04/22 1000) SpO2:  [91 %-100 %] 96 % (04/22 1000) FiO2 (%):  [40 %-60 %] 40 % (04/22 0800) Weight:  [109.5 kg (241 lb 6.5 oz)] 109.5 kg (241 lb 6.5 oz) (04/22 0037)   HEMODYNAMICS:   VENTILATOR SETTINGS: Vent Mode:  [-] PRVC FiO2 (%):  [40 %-60 %] 40 % Set Rate:  [14 bmp-24 bmp] 14 bmp Vt Set:  [500 mL-560 mL] 560 mL PEEP:  [5 cmH20] 5 cmH20 Plateau Pressure:  [21 cmH20-27 cmH20] 21 cmH20 INTAKE / OUTPUT: Intake/Output     04/21 0701 - 04/22 0700 04/22 0701 - 04/23 0700   I.V. (mL/kg) 765.3 (7) 338 (3.1)   NG/GT  30   IV Piggyback 150    Total Intake(mL/kg) 915.3 (8.4) 368 (3.4)   Urine (mL/kg/hr) 300 150 (0.3)   Total Output 300 150   Net +615.3 +218         PHYSICAL EXAMINATION: General:  Appears acutely ill, mechanically ventilated, synchronous Neuro:  Encephalopathic, nonfocal, cough / gag diminished HEENT:  PERRL, OETT / OGT Cardiovascular:  RRR, no m/r/g Lungs:  Bilateral diminished air entry, few exp wheezes, prolonged exp phase  Abdomen:  Obese, soft, nontender, bowel sounds diminished Musculoskeletal:  No edema, venous stasis dermatitis BL LE Skin:  Intact  LABS:  Recent Labs Lab 06/20/12 0152   NA 146*  K 3.4*  CL 101  CO2 43*  BUN 30*  CREATININE 1.71*  GLUCOSE 128*    Recent Labs Lab 06/19/12 2205  HGB 13.2  HCT 44.0  WBC 14.4*  PLT 65*   ABG    Component Value Date/Time   PHART 7.488* 06/20/2012 0054   PCO2ART 59.6* 06/20/2012 0054   PO2ART 171.0* 06/20/2012 0054   HCO3 44.7* 06/20/2012 0054   TCO2 39.6 06/20/2012 0054   ACIDBASEDEF 0.0 05/12/2012 1756   O2SAT 99.3 06/20/2012 0054    Recent Labs Lab 06/19/12 1940 06/19/12 2110 06/20/12 0311 06/20/12 0838  GLUCAP 131* 125* 135* 177*   CXR:  4/21 >>> ETT in place, no overt airspace disease  ASSESSMENT / PLAN:  PULMONARY A:  Acute on chronic respiratory failure.  OSA / OHS, noncompliant with home CPAP.  COPD with exacerbation.  No overt pneumonia. Still w/ obstruction on exam and as evidenced by vent mechanics.  P:   Full mechanical support-->have decreased RR to allow some spont resp efforts.  Daily SBT Trend ABG / CXR Atrovent / Albuterol Solu-Medrol-->begin taper in am   CARDIOVASCULAR A: HTN.  Dyslipidemia. P:  Goal MAP>60 Bisoprolol, Zocor when able  RENAL A:  CKD. (baseline scr 1.9)       Hypernatremia  Hypokalemia  P:   Hold Lasix Gentle KCL replacement Add free water  Zemplar when able  GASTROINTESTINAL A:  No active isses. P:   Add tubefeeds  Protonix for GI Px  HEMATOLOGIC A:  Thrombocytopenia. P:  Trend CBC SCDs for DVT Px  INFECTIOUS A:  Acute bronchitis. P:   Cultures and antibiotics as above  ENDOCRINE  A:  DM2. P:   SSI  NEUROLOGIC A:  Acute encephalopathy. P:   Goal RASS 0 to -1 Fentanyl / Versed gtt  TODAY'S SUMMARY: still w/ bronchospasm. Will cont full vent support. Maximize BDs and f/u daily for readiness to wean  I have personally obtained a history, examined the patient, evaluated laboratory and imaging results, formulated the assessment and plan and placed orders.  CRITICAL CARE:  The patient is critically ill with multiple organ  systems failure and requires high complexity decision making for assessment and support, frequent evaluation and titration of therapies, application of advanced monitoring technologies and extensive interpretation of multiple databases. Critical Care Time devoted to patient care services described in this note is 35 minutes.     Billy Fischer, MD ; [facility] [phone].  After 5:30 PM or weekends, call [phone]

## 2012-06-20 NOTE — Progress Notes (Signed)
CARE MANAGEMENT NOTE 06/20/2012  Patient:  [name] [name], [name] [name]   Account Number:  [id number]  Date Initiated:  06/20/2012  Documentation initiated by:  DAVIS,RHONDA  Subjective/Objective Assessment:   pt with resp failure required intubation/hx of copd     Action/Plan:   from home   Anticipated DC Date:  06/23/2012   Anticipated DC Plan:  HOME/SELF CARE  In-house referral  NA      DC Planning Services  NA      [facility] Choice  NA   Choice offered to / List presented to:  NA   DME arranged  NA      DME agency  NA     HH arranged  NA      HH agency  NA   Status of service:  In process, will continue to follow Medicare Important Message given?  NA - LOS <3 / Initial given by admissions (If response is "NO", the following Medicare IM given date fields will be blank) Date Medicare IM given:   Date Additional Medicare IM given:    Discharge Disposition:    Per UR Regulation:  Reviewed for med. necessity/level of care/duration of stay  If discussed at Long Length of Stay Meetings, dates discussed:    Comments:  [id number] Earlene Plater, RN, BSN, CCM:  CHART REVIEWED AND UPDATED.  Next chart review due on [id number]. NO DISCHARGE NEEDS PRESENT AT THIS TIME. CASE MANAGEMENT [phone]

## 2012-06-21 ENCOUNTER — Inpatient Hospital Stay (HOSPITAL_COMMUNITY): Payer: Medicare Other

## 2012-06-21 DIAGNOSIS — E662 Morbid (severe) obesity with alveolar hypoventilation: Secondary | ICD-10-CM

## 2012-06-21 DIAGNOSIS — D696 Thrombocytopenia, unspecified: Secondary | ICD-10-CM

## 2012-06-21 LAB — BASIC METABOLIC PANEL
CO2: 43 mEq/L (ref 19–32)
Calcium: 9.6 mg/dL (ref 8.4–10.5)
Creatinine, Ser: 2.1 mg/dL — ABNORMAL HIGH (ref 0.50–1.35)
GFR calc Af Amer: 34 mL/min — ABNORMAL LOW (ref >90)
GFR calc non Af Amer: 29 mL/min — ABNORMAL LOW (ref >90)
Sodium: 142 mEq/L (ref 135–145)

## 2012-06-21 LAB — BLOOD GAS, ARTERIAL
Acid-Base Excess: 12.3 mmol/L — ABNORMAL HIGH (ref 0.0–2.0)
Bicarbonate: 40.1 mEq/L — ABNORMAL HIGH (ref 20.0–24.0)
MECHVT: 560 mL
O2 Saturation: 94.4 %
Patient temperature: 37
TCO2: 36.4 mmol/L (ref 0–100)
pH, Arterial: 7.371 (ref 7.350–7.450)

## 2012-06-21 LAB — GLUCOSE, CAPILLARY
Glucose-Capillary: 175 mg/dL — ABNORMAL HIGH (ref 70–99)
Glucose-Capillary: 216 mg/dL — ABNORMAL HIGH (ref 70–99)
Glucose-Capillary: 229 mg/dL — ABNORMAL HIGH (ref 70–99)
Glucose-Capillary: 230 mg/dL — ABNORMAL HIGH (ref 70–99)
Glucose-Capillary: 238 mg/dL — ABNORMAL HIGH (ref 70–99)
Glucose-Capillary: 238 mg/dL — ABNORMAL HIGH (ref 70–99)

## 2012-06-21 LAB — CBC
MCV: 94 fL (ref 78.0–100.0)
Platelets: 51 10*3/uL — ABNORMAL LOW (ref 150–400)
RBC: 4.03 MIL/uL — ABNORMAL LOW (ref 4.22–5.81)
RDW: 14.5 % (ref 11.5–15.5)
WBC: 13.1 10*3/uL — ABNORMAL HIGH (ref 4.0–10.5)

## 2012-06-21 MED ORDER — LEVOFLOXACIN IN D5W 750 MG/150ML IV SOLN
750.0000 mg | INTRAVENOUS | Status: DC
  Filled 2012-06-21: qty 150

## 2012-06-21 MED ORDER — ALBUTEROL SULFATE (5 MG/ML) 0.5% IN NEBU
2.5000 mg | INHALATION_SOLUTION | Freq: Four times a day (QID) | RESPIRATORY_TRACT | Status: DC
  Administered 2012-06-21 – 2012-06-23 (×8): 2.5 mg via RESPIRATORY_TRACT
  Filled 2012-06-21 (×8): qty 0.5

## 2012-06-21 MED ORDER — CHLORHEXIDINE GLUCONATE 0.12 % MT SOLN
15.0000 mL | Freq: Two times a day (BID) | OROMUCOSAL | Status: DC
  Administered 2012-06-22 – 2012-06-23 (×3): 15 mL via OROMUCOSAL
  Filled 2012-06-21 (×5): qty 15

## 2012-06-21 MED ORDER — IPRATROPIUM BROMIDE 0.02 % IN SOLN
0.5000 mg | Freq: Four times a day (QID) | RESPIRATORY_TRACT | Status: DC
  Administered 2012-06-21 – 2012-06-23 (×8): 0.5 mg via RESPIRATORY_TRACT
  Filled 2012-06-21 (×8): qty 2.5

## 2012-06-21 NOTE — Progress Notes (Addendum)
Brief Pharmacy Consult Note - CCM/Renal Adjustment of Antibiotics  Day #2 Levaquin 750mg  IV q24h for acute bronchitis.  Afebrile WBC improved: 14.4 > 13.1 SCr increasing: 1.71 > 2.1 CrCl now 37 ml/min  4/22 blood cx: ngtd 4/22 sputum: nonpathogenic flora (prelim)  Plan:  Decrease Levaquin to 750mg  IV q48h - next dose will be due 4/24 Follow up renal function & cultures Planned duration of therapy is 5 days  Loralee Pacas, PharmD, BCPS Pager: [phone] 06/21/2012 12:06 PM

## 2012-06-21 NOTE — Progress Notes (Signed)
CRITICAL VALUE ALERT  Critical value received:  CO2 43 BMET, (CO2 71 on ABG)  Date of notification:  06/21/12  Time of notification: 0550  Critical value read back:yes  Nurse who received alert:  c Briggs Edelen  MD notified (1st page):  elink MD Time of first page:  0610 MD notified (2nd page):  Time of second page:  Responding [id number] MD  Time MD responded: [phone]

## 2012-06-21 NOTE — Progress Notes (Signed)
Inpatient Diabetes Program Recommendations  AACE/ADA: New Consensus Statement on Inpatient Glycemic Control (2013)  Target Ranges:  Prepandial:   less than 140 mg/dL      Peak postprandial:   less than 180 mg/dL (1-2 hours)      Critically ill patients:  140 - 180 mg/dL   Reason for Visit: Hyperglycemia  Results for SHAFTER, JUPIN (MRN [id number]) as of 06/21/2012 11:40  Ref. Range 06/20/2012 16:28 06/20/2012 19:55 06/20/2012 23:59 06/21/2012 03:56 06/21/2012 07:36  Glucose-Capillary Latest Range: 70-99 mg/dL 782 (H) 956 (H) 213 (H) 229 (H) 216 (H)   Results for ACE, BERGFELD (MRN [id number]) as of 06/21/2012 11:40  Ref. Range 05/12/2012 16:45  Hemoglobin A1C Latest Range: <5.7 % 7.2 (H)    Inpatient Diabetes Program Recommendations Insulin - Basal: Add Lantus 10 units QHS (Home dose Lantus 10 units bid) May need meal coverage insulin when diet begins.  Note: Will continue to follow.  Thank you. Ailene Ards, RD, LDN, CDE Inpatient Diabetes Coordinator [phone]

## 2012-06-21 NOTE — Progress Notes (Signed)
PULMONARY  / CRITICAL CARE MEDICINE  Name: [name] [name] MRN: [id number] DOB: 10-Jan-1941    ADMISSION DATE:  06/19/2012 CONSULTATION DATE:  06/19/2012  REFERRING MD :  EDP PRIMARY SERVICE:  PCCM   BRIEF PATIENT DESCRIPTION: 72 yo with COPD, OSA/OHS intubated for acute on chronic hypercarbic respiratory failure.  SIGNIFICANT EVENTS / STUDIES:  4/21  Intubated for hypercarbic respiratory failure 4/21  Head CT >>> nad  LINES / TUBES: OETT 4/21 >>>4/23 OGT  4/21 >>>4/23 L [place] CVL  4/21 >>>  CULTURES: 4/21  Blood >>> 4/21  Respiratory >>>NF  ANTIBIOTICS: Levaquin 4/22 >>>  VITAL SIGNS: Temp:  [97.3 F (36.3 C)-98.2 F (36.8 C)] 98.2 F (36.8 C) (04/23 1000) Pulse Rate:  [61-82] 82 (04/23 1000) Resp:  [14-16] 14 (04/23 1000) BP: (102-167)/(45-114) 143/111 mmHg (04/23 1000) SpO2:  [92 %-100 %] 98 % (04/23 1000) FiO2 (%):  [40 %] 40 % (04/23 0743) Weight:  [112.6 kg (248 lb 3.8 oz)] 112.6 kg (248 lb 3.8 oz) (04/23 0405)   HEMODYNAMICS:   VENTILATOR SETTINGS: Vent Mode:  [-] PRVC FiO2 (%):  [40 %] 40 % Set Rate:  [14 bmp] 14 bmp Vt Set:  [560 mL] 560 mL PEEP:  [5 cmH20] 5 cmH20 Plateau Pressure:  [16 cmH20-27 cmH20] 16 cmH20 INTAKE / OUTPUT: Intake/Output     04/22 0701 - 04/23 0700 04/23 0701 - 04/24 0700   I.V. (mL/kg) 1350.1 (12) 80 (0.7)   NG/GT 1210 80   IV Piggyback 150    Total Intake(mL/kg) 2710.1 (24.1) 160 (1.4)   Urine (mL/kg/hr) 1120 (0.4) 325 (0.7)   Emesis/NG output 50 (0)    Total Output 1170 325   Net +1540.1 -165         PHYSICAL EXAMINATION: General: awake, no distress. Passed SBT. Appropriate  Neuro: no focal def. Moves all ext  HEENT:  PERRL, bno JVD  Cardiovascular:  RRR, no m/r/g Lungs:  Better air movement. Prolonged exp time. Exp wheeze  Improved  Abdomen:  Obese, soft, nontender, bowel sounds diminished Musculoskeletal:  No edema, venous stasis dermatitis BL LE Skin:  Intact  LABS:  Recent Labs Lab 06/20/12 0152 06/21/12 0430   NA 146* 142  K 3.4* 4.6  CL 101 95*  CO2 43* 43*  BUN 30* 51*  CREATININE 1.71* 2.10*  GLUCOSE 128* 246*    Recent Labs Lab 06/19/12 2205 06/21/12 0430  HGB 13.2 11.6*  HCT 44.0 37.9*  WBC 14.4* 13.1*  PLT 65* 51*   ABG    Component Value Date/Time   PHART 7.371 06/21/2012 0259   PCO2ART 71.0* 06/21/2012 0259   PO2ART 77.5* 06/21/2012 0259   HCO3 40.1* 06/21/2012 0259   TCO2 36.4 06/21/2012 0259   ACIDBASEDEF 0.0 05/12/2012 1756   O2SAT 94.4 06/21/2012 0259    Recent Labs Lab 06/20/12 1628 06/20/12 1955 06/20/12 2359 06/21/12 0356 06/21/12 0736  GLUCAP 217* 211* 230* 229* 216*   CXR:  4/23 >>> ETT in place, no overt airspace disease, actually improved aeration   ASSESSMENT / PLAN:  PULMONARY A:  Acute on chronic respiratory failure.   COPD with exacerbation OSA / OHS, noncompliant with home CPAP.   Passed SBT 4/23 P:   Extubate Wean FIO2 Nocturnal BIPAP Taper steroids Cont BD regimen   CARDIOVASCULAR A: HTN.   Dyslipidemia. P:  Goal MAP>60 Bisoprolol, Zocor when able Resume diuretics   RENAL A:  CKD. (baseline scr 1.9)-->scr a little worse as of 4/23  Hypernatremia-->better  P:   Zemplar when able Allow free water intake  GASTROINTESTINAL A:  No active isses. P:   Advance diet as tol   HEMATOLOGIC A:  Thrombocytopenia. Not on heparin. Appears chronic per review of prior lab work. Will cont to monitor.  P:  Trend CBC SCDs for DVT Px  INFECTIOUS A:  Acute bronchitis. P:   Complete 5d levaquin   ENDOCRINE  A:  DM2. P:   SSI  NEUROLOGIC A:  Acute encephalopathy.-->improved  P:   Supportive care   Looks better. Non-compliance w/ BIPAP a issue. Only home for a couple of days s/p release from SNF. Passed SBT so extubated     Billy Fischer, MD ; [facility] [phone].  After 5:30 PM or weekends, call [phone]

## 2012-06-21 NOTE — Progress Notes (Signed)
NUTRITION FOLLOW UP  Intervention:   Diet advancement per MD discretion RD to monitor PO intake and add supplements if necessary Provide Multivitamin with minerals daily  Nutrition Dx:   Inadequate oral intake related to inability to eat as evidenced by pt intubated with NPO status; ongoing- pt extubated but remains NPO  Goal:   Enteral nutrition to provide 60-70% of estimated calorie needs (22-25 kcals/kg ideal body weight) and 100% of estimated protein needs, based on ASPEN guidelines for permissive underfeeding in critically ill obese individuals; discontinue-pt extubated  New goal: Pt to meet >/= 90% of their estimated nutrition needs  Monitor:   Wt; 7 lb wt increase Diet advancement/PO intake Labs; high glucose, high sodium, low potassium, low hemoglobin (4/23)  Assessment:   Pt extubated, awake and alert at time of visit. Pt reports that he has had a decreased appetite for the past 6 months but, unable to describe how much less he has been eating. Pt states that he has lost some weight in the past 6 months and that he usually weighs between 245 and 255 lbs. Pt denies any nausea, abdominal pain, and chewing/swallowing difficulty. Pt reports that he does not feel hungry but, he is thirsty and would like to eat something.  Height: Ht Readings from Last 1 Encounters:  06/20/12 5\' 7"  (1.702 m)    Weight Status:   Wt Readings from Last 1 Encounters:  06/21/12 248 lb 3.8 oz (112.6 kg)    Re-estimated needs:  Kcal: 2250-2480 Protein: 120-170 grams Fluid: 2.2-2.4 L  Skin: non-pitting RLE and LLE edema; intact   Diet Order: NPO   Intake/Output Summary (Last 24 hours) at 06/21/12 1202 Last data filed at 06/21/12 1100  Gross per 24 hour  Intake 2112.51 ml  Output   1245 ml  Net 867.51 ml    Last BM: PTA   Labs:   Recent Labs Lab 06/20/12 0152 06/21/12 0430  NA 146* 142  K 3.4* 4.6  CL 101 95*  CO2 43* 43*  BUN 30* 51*  CREATININE 1.71* 2.10*  CALCIUM 8.5  9.6  MG 1.5  --   PHOS 2.2*  --   GLUCOSE 128* 246*    CBG (last 3)   Recent Labs  06/20/12 2359 06/21/12 0356 06/21/12 0736  GLUCAP 230* 229* 216*    Scheduled Meds: . albuterol  2.5 mg Nebulization Q6H  . antiseptic oral rinse  1 application Mouth Rinse QID  . [START ON 06/22/2012] chlorhexidine  15 mL Mouth/Throat BID  . Chlorhexidine Gluconate Cloth  6 each Topical Q0600  . erythromycin   Left Eye Once  . feeding supplement (PROMOTE)  1,000 mL Per Tube Q24H  . insulin aspart  0-15 Units Subcutaneous Q4H  . ipratropium  0.5 mg Nebulization Q6H  . levofloxacin (LEVAQUIN) IV  750 mg Intravenous Q24H  . methylPREDNISolone (SOLU-MEDROL) injection  40 mg Intravenous Q12H  . multivitamin  5 mL Per Tube Daily  . mupirocin ointment  1 application Nasal BID    Continuous Infusions: . sodium chloride 20 mL/hr at 06/20/12 1145    Ian Malkin RD, LDN Inpatient Clinical Dietitian Pager: [phone] After Hours Pager: [phone]

## 2012-06-22 LAB — COMPREHENSIVE METABOLIC PANEL
ALT: 8 U/L (ref 0–53)
Alkaline Phosphatase: 59 U/L (ref 39–117)
BUN: 48 mg/dL — ABNORMAL HIGH (ref 6–23)
CO2: 39 mEq/L — ABNORMAL HIGH (ref 19–32)
Chloride: 96 mEq/L (ref 96–112)
GFR calc Af Amer: 38 mL/min — ABNORMAL LOW (ref >90)
GFR calc non Af Amer: 33 mL/min — ABNORMAL LOW (ref >90)
Glucose, Bld: 242 mg/dL — ABNORMAL HIGH (ref 70–99)
Potassium: 4.5 mEq/L (ref 3.5–5.1)
Sodium: 142 mEq/L (ref 135–145)
Total Bilirubin: 0.4 mg/dL (ref 0.3–1.2)
Total Protein: 6.8 g/dL (ref 6.0–8.3)

## 2012-06-22 LAB — CBC
HCT: 40.3 % (ref 39.0–52.0)
Hemoglobin: 12.4 g/dL — ABNORMAL LOW (ref 13.0–17.0)
RBC: 4.31 MIL/uL (ref 4.22–5.81)

## 2012-06-22 LAB — GLUCOSE, CAPILLARY
Glucose-Capillary: 142 mg/dL — ABNORMAL HIGH (ref 70–99)
Glucose-Capillary: 224 mg/dL — ABNORMAL HIGH (ref 70–99)
Glucose-Capillary: 149 mg/dL — ABNORMAL HIGH (ref 70–99)

## 2012-06-22 LAB — CULTURE, RESPIRATORY W GRAM STAIN: Special Requests: NORMAL

## 2012-06-22 MED ORDER — LEVOFLOXACIN 750 MG PO TABS
750.0000 mg | ORAL_TABLET | ORAL | Status: DC
  Administered 2012-06-22: 750 mg via ORAL
  Filled 2012-06-22: qty 1

## 2012-06-22 MED ORDER — CALCITRIOL 0.25 MCG PO CAPS
0.2500 ug | ORAL_CAPSULE | Freq: Every day | ORAL | Status: DC
  Administered 2012-06-22 – 2012-06-23 (×2): 0.25 ug via ORAL
  Filled 2012-06-22 (×2): qty 1

## 2012-06-22 MED ORDER — PREDNISONE 20 MG PO TABS
40.0000 mg | ORAL_TABLET | Freq: Every day | ORAL | Status: DC
  Administered 2012-06-23: 40 mg via ORAL
  Filled 2012-06-22 (×3): qty 2

## 2012-06-22 MED ORDER — INSULIN ASPART 100 UNIT/ML ~~LOC~~ SOLN
0.0000 [IU] | Freq: Three times a day (TID) | SUBCUTANEOUS | Status: DC
  Administered 2012-06-22: 15 [IU] via SUBCUTANEOUS
  Administered 2012-06-23: 2 [IU] via SUBCUTANEOUS
  Administered 2012-06-23: 8 [IU] via SUBCUTANEOUS

## 2012-06-22 MED ORDER — INSULIN ASPART 100 UNIT/ML ~~LOC~~ SOLN
4.0000 [IU] | Freq: Three times a day (TID) | SUBCUTANEOUS | Status: DC
  Administered 2012-06-22 – 2012-06-23 (×3): 4 [IU] via SUBCUTANEOUS

## 2012-06-22 MED ORDER — BISOPROLOL FUMARATE 5 MG PO TABS
5.0000 mg | ORAL_TABLET | Freq: Two times a day (BID) | ORAL | Status: DC
  Administered 2012-06-22 – 2012-06-23 (×2): 5 mg via ORAL
  Filled 2012-06-22 (×3): qty 1

## 2012-06-22 MED ORDER — INSULIN ASPART 100 UNIT/ML ~~LOC~~ SOLN: 0.0000 [IU] | Freq: Every day | SUBCUTANEOUS | Status: DC

## 2012-06-22 MED ORDER — ADULT MULTIVITAMIN W/MINERALS CH
1.0000 | ORAL_TABLET | Freq: Every day | ORAL | Status: DC
  Administered 2012-06-22 – 2012-06-23 (×2): 1 via ORAL
  Filled 2012-06-22 (×2): qty 1

## 2012-06-22 MED ORDER — FUROSEMIDE 40 MG PO TABS
40.0000 mg | ORAL_TABLET | Freq: Two times a day (BID) | ORAL | Status: DC
  Administered 2012-06-22 – 2012-06-23 (×2): 40 mg via ORAL
  Filled 2012-06-22 (×4): qty 1

## 2012-06-22 NOTE — Plan of Care (Signed)
Problem: Phase II Progression Outcomes Goal: Time pt extubated/weaned off vent Outcome: Completed/Met Date Met:  06/22/12 Pt was extubated 06/20/12 at noon

## 2012-06-22 NOTE — Progress Notes (Signed)
PULMONARY  / CRITICAL CARE MEDICINE  Name: [name] [name] MRN: [id number] DOB: [birth year]    ADMISSION DATE:  06/19/2012 CONSULTATION DATE:  06/19/2012  REFERRING MD :  EDP PRIMARY SERVICE:  PCCM   BRIEF PATIENT DESCRIPTION: 72 yo with COPD, OSA/OHS intubated for acute on chronic hypercarbic respiratory failure.  SIGNIFICANT EVENTS / STUDIES:  4/21  Intubated for hypercarbic respiratory failure 4/21  Head CT >>> nad  LINES / TUBES: OETT 4/21 >>>4/23 OGT  4/21 >>>4/23 L [place] CVL  4/21 >>>  CULTURES: 4/21  Blood >>> 4/21  Respiratory >>>NF  ANTIBIOTICS: Levaquin 4/22 >>>  VITAL SIGNS: Temp:  [90.3 F (32.4 C)-99.1 F (37.3 C)] 99 F (37.2 C) (04/24 1300) Pulse Rate:  [72-102] 87 (04/24 1300) Resp:  [15-28] 23 (04/24 1300) BP: (117-170)/(47-87) 165/59 mmHg (04/24 1300) SpO2:  [88 %-97 %] 90 % (04/24 1300)   4 liters n/c     VENTILATOR SETTINGS:   INTAKE / OUTPUT: Intake/Output     04/23 0701 - 04/24 0700 04/24 0701 - 04/25 0700   P.O. 600 240   I.V. (mL/kg) 540 (4.8) 120 (1.1)   NG/GT 80    IV Piggyback     Total Intake(mL/kg) 1220 (10.8) 360 (3.2)   Urine (mL/kg/hr) 2575 (1) 710 (0.9)   Emesis/NG output     Total Output 2575 710   Net -1355 -350         PHYSICAL EXAMINATION: General: awake, no distress. Neuro: no focal def. Moves all ext  HEENT:  PERRL, bno JVD  Cardiovascular:  RRR, no m/r/g Lungs:  Better air movement. Prolonged exp time. Exp wheeze improved  Abdomen:  Obese, soft, nontender, bowel sounds diminished Musculoskeletal:  No edema, venous stasis dermatitis BL LE Skin:  Intact  LABS:  Recent Labs Lab 06/20/12 0152 06/21/12 0430 06/22/12 0500  NA 146* 142 142  K 3.4* 4.6 4.5  CL 101 95* 96  CO2 43* 43* 39*  BUN 30* 51* 48*  CREATININE 1.71* 2.10* 1.93*  GLUCOSE 128* 246* 242*    Recent Labs Lab 06/19/12 2205 06/21/12 0430 06/22/12 0500  HGB 13.2 11.6* 12.4*  HCT 44.0 37.9* 40.3  WBC 14.4* 13.1* 17.1*  PLT 65* 51* 68*      Recent Labs Lab 06/21/12 1522 06/21/12 1925 06/21/12 2328 06/22/12 0357 06/22/12 1134  GLUCAP 238* 238* 142* 232* 224*   CXR: no new film  ASSESSMENT / PLAN:  PULMONARY A:  Acute on chronic respiratory failure.   COPD with exacerbation OSA / OHS, noncompliant with home CPAP.   Continues to improve. P:   Wean FIO2 Nocturnal BIPAP Taper steroids Cont BD regimen   CARDIOVASCULAR A: HTN.   Dyslipidemia. P:  Resume home meds   RENAL A:  CKD. (baseline scr 1.9)-->scr close to baseline now P:   Zemplar when able Allow free water intake  GASTROINTESTINAL A:  No active isses. P:   Advance diet as tol   HEMATOLOGIC A:  Thrombocytopenia. Not on heparin. Appears chronic per review of prior lab work. Will cont to monitor, looking better  P:  Trend CBC SCDs for DVT Px  INFECTIOUS A:  Acute bronchitis. P:   Complete 5d levaquin   ENDOCRINE  A:  DM2. P:   SSI Decrease steroids   NEUROLOGIC A:  Acute encephalopathy.-->improved, but still confused.  P:   Supportive care   Looks better. Non-compliance w/ BIPAP a issue. Family and pt agreeable to SNF if can be arranged  David Simonds, MD ; PCCM service Mobile [phone].  After 5:30 PM or weekends, call [phone]  

## 2012-06-22 NOTE — Progress Notes (Signed)
Patient placed on auto titration BiPAP for nocturnal use with 3L O2 bleed in. Settings Imax 20 Emin 8. VSS at this time. He is encouraged to call for further assistance if needed.

## 2012-06-22 NOTE — Clinical Social Work Psychosocial (Signed)
Clinical Social Work Department BRIEF PSYCHOSOCIAL ASSESSMENT 06/22/2012  Patient:  [name] [name], [name] [name]     Account Number:  [id number]     Admit date:  06/19/2012  Clinical Social Worker:  Jodelle Red  Date/Time:  06/22/2012 10:34 AM  Referred by:  CSW  Date Referred:  06/22/2012 Referred for  Other - See comment   Other Referral:   possible SNF placement, Pt recently d/c'd from SNF   Interview type:  Patient Other interview type:   sister, Lynden Oxford  over the phone    PSYCHOSOCIAL DATA Living Status:  ALONE Admitted from facility:   Level of care:   Primary support name:  Lynden Oxford Primary support relationship to patient:  SIBLING Degree of support available:   good from extended family. Pt has daughter, Deanna Artis but she is in [place] until May 5.    CURRENT CONCERNS Current Concerns  Financial Resources  Post-Acute Placement   Other Concerns:   Per sister, Pt cannot safely live alone. She has been attempting to place Pt at a SNF from home and has applied for LTC medicaid to assist with this process.    SOCIAL WORK ASSESSMENT / PLAN CSW met with Pt and his niece at the bedside to assess needs. Pt was at [facility] until 4/3. Per his report, financial issues prevented him for staying at [facility]. Pt has a copay for SNF of $50 a day and he cannot afford this due to financial concerns. He states he thinks his sister has applied for mcd. He is agreeable to SNF currently, but cannot afford it unless it's "free". CSW explained options and Pt was agreeable to CSW speaking with his sister.  Per sister, she has been working with [employer] office and was told Pt will qualify for LTC medicaid. She really thinks Pt needs SNF, as he cannot care for himself at home. She is willing to assist with the process. CSW explained Pt can still make his own decisions even if we dont think they are good choices.   Assessment/plan status:  Psychosocial Support/Ongoing Assessment of Needs Other  assessment/ plan:   SNF placement   Information/referral to community resources:   HCPOA packet  SNF list    PATIENT'S/FAMILY'S RESPONSE TO PLAN OF CARE: Pt agreeable to SNF. Sister pursuing LTC medicaid. CSW will follow for SNF. Sister intested in [facility]. Pt agrees to plan.    Doreen Salvage, LCSW ICU/Stepdown Clinical Social Worker [facility] Cell [phone] Hours 8am-1200pm M-F

## 2012-06-22 NOTE — Progress Notes (Signed)
Removed foley. Pt tolerated procedure well and is DTV at 0040. Will continue to monitor.

## 2012-06-23 DIAGNOSIS — N189 Chronic kidney disease, unspecified: Secondary | ICD-10-CM

## 2012-06-23 LAB — BASIC METABOLIC PANEL
BUN: 47 mg/dL — ABNORMAL HIGH (ref 6–23)
Creatinine, Ser: 1.98 mg/dL — ABNORMAL HIGH (ref 0.50–1.35)
GFR calc non Af Amer: 32 mL/min — ABNORMAL LOW (ref >90)
Glucose, Bld: 272 mg/dL — ABNORMAL HIGH (ref 70–99)
Potassium: 4.8 mEq/L (ref 3.5–5.1)

## 2012-06-23 LAB — GLUCOSE, CAPILLARY: Glucose-Capillary: 305 mg/dL — ABNORMAL HIGH (ref 70–99)

## 2012-06-23 MED ORDER — INSULIN ASPART 100 UNIT/ML ~~LOC~~ SOLN: SUBCUTANEOUS | Status: DC

## 2012-06-23 MED ORDER — LEVOFLOXACIN 750 MG PO TABS: 750.0000 mg | ORAL_TABLET | ORAL | Status: DC

## 2012-06-23 MED ORDER — BUDESONIDE 0.25 MG/2ML IN SUSP: 0.2500 mg | Freq: Four times a day (QID) | RESPIRATORY_TRACT | Status: DC

## 2012-06-23 MED ORDER — BUDESONIDE 0.25 MG/2ML IN SUSP
0.2500 mg | Freq: Four times a day (QID) | RESPIRATORY_TRACT | Status: DC
  Administered 2012-06-23: 0.25 mg via RESPIRATORY_TRACT
  Filled 2012-06-23 (×4): qty 2

## 2012-06-23 MED ORDER — INSULIN GLARGINE 100 UNIT/ML ~~LOC~~ SOLN
20.0000 [IU] | Freq: Every day | SUBCUTANEOUS | Status: DC
  Administered 2012-06-23: 20 [IU] via SUBCUTANEOUS
  Filled 2012-06-23: qty 0.2

## 2012-06-23 MED ORDER — ALBUTEROL SULFATE (2.5 MG/3ML) 0.083% IN NEBU: 5.0000 mg | INHALATION_SOLUTION | Freq: Four times a day (QID) | RESPIRATORY_TRACT | Status: DC

## 2012-06-23 MED ORDER — INSULIN GLARGINE 100 UNIT/ML ~~LOC~~ SOLN: 20.0000 [IU] | Freq: Every day | SUBCUTANEOUS | Status: DC

## 2012-06-23 NOTE — Discharge Summary (Signed)
Physician Discharge Summary     Patient ID: [name] [name] MRN: [id number] DOB/AGE: 74-Jan-1942 74 y.o.  Admit date: 06/19/2012 Discharge date: 06/23/2012  Admission Diagnoses: Acute encephalopathy Acute respiratory failure  Discharge Diagnoses:  Active Problems:   DIABETES MELLITUS, TYPE II   Thrombocytopenia   Acute-on-chronic respiratory failure   COPD exacerbation   Acute encephalopathy   Obstructive chronic bronchitis with exacerbation   Significant Hospital tests/ studies/ interventions and procedures   4/21 Intubated for hypercarbic respiratory failure  4/21 Head CT >>> nad   LINES / TUBES:  OETT 4/21 >>>4/23  OGT 4/21 >>>4/23  L [place] CVL 4/21 >>> 4/25  CULTURES:  4/21 Blood >>>  4/21 Respiratory >>>NF   ANTIBIOTICS:  Levaquin 4/22 >>>   BRIEF PATIENT DESCRIPTION:  HPI Comments: Patient with COPD and DM has not been eating or drinking for the past 2 days has had several falls from standing is less responsive than normal Has not been using his BiPap at night has not been elevating his legs and it is unknown if he is taking his medications. Patient lives alone and siblings check on him daily as well as home health nurse  Hospital Course:  Acute on chronic respiratory failure.  COPD with exacerbation, acute bronchitis  OSA / OHS, noncompliant with home CPAP.  Continues to improve. Think that this was primarily a complication of his medical non-compliance (not using Meds and BIPAP). He was treated primarily for bronchospasm, supported with mechanical vent for 24 hours, empiric antibiotics and systemic steroids. He was remarkably better very rapidly. He was extubated with in 24 hours. We tapered his steroids and he is much improved. We will discharge him to home with the primary recommendation that he MUST continue taking his medications as previously ordered.  Plan:  Home on oxygen Nocturnal BIPAP  Cont BD regimen and scheduled ICS Complete 2 more d abx Will have  f/u in our office. At this point we will re-eval: 1) O2 requirements 2) medication and BIPAP adherence    Acute encephalopathy.-->improved, now resolved. Was due to hypercapnia.  P:  Supportive care   HTN.  Dyslipidemia.  P:  Resume home meds   CKD. (baseline scr 1.9)-->scr close to baseline now  P:  Zemplar when able   Thrombocytopenia. Appears chronic per review of prior lab work. Will cont to monitor, looking better   DM2.  P:  Home off steroids.  Resume sliding scale Have made him a follow up with Dr [name]: Have asked him to record his blood glucose and present these to Dr [name] on his follow up visit.  Discharge Exam: BP 147/68  Pulse 83  Temp(Src) 97.9 F (36.6 C) (Oral)  Resp 20  Ht 5' 7.5" (1.715 m)  Wt 111.131 kg (245 lb)  BMI 37.78 kg/m2  SpO2 95%  PHYSICAL EXAMINATION:  General: awake, no distress.  Neuro: no focal def. Moves all ext  HEENT: PERRL, bno JVD  Cardiovascular: RRR, no m/r/g  Lungs: Better air movement. Prolonged exp time. Exp wheeze improved  Abdomen: Obese, soft, nontender, bowel sounds diminished  Musculoskeletal: No edema, venous stasis dermatitis BL LE  Skin: Intact   Labs at discharge Lab Results  Component Value Date   CREATININE 1.98* 06/23/2012   BUN 47* 06/23/2012   NA 138 06/23/2012   K 4.8 06/23/2012   CL 96 06/23/2012   CO2 39* 06/23/2012   Lab Results  Component Value Date   WBC 17.1* 06/22/2012   HGB 12.4* 06/22/2012  HCT 40.3 06/22/2012   MCV 93.5 06/22/2012   PLT 68* 06/22/2012   Lab Results  Component Value Date   ALT 8 06/22/2012   AST 14 06/22/2012   ALKPHOS 59 06/22/2012   BILITOT 0.4 06/22/2012   Lab Results  Component Value Date   INR 1.01 06/19/2012   INR 1.06 04/22/2011   INR 1.10 04/16/2011    Current radiology studies No results found.  Disposition:  03-Skilled Nursing Facility      Discharge Orders   Future Appointments Provider Department Dept Phone   06/26/2012 3:15 PM Ronnald Nian, MD  [facility] [phone]   06/29/2012 2:00 PM Julio Sicks, NP [facility] [phone]   Future Orders Complete By Expires     (HEART FAILURE PATIENTS) Call MD:  Anytime you have any of the following symptoms: 1) 3 pound weight gain in 24 hours or 5 pounds in 1 week 2) shortness of breath, with or without a dry hacking cough 3) swelling in the hands, feet or stomach 4) if you have to sleep on extra pillows at night in order to breathe.  As directed     Call MD for:  extreme fatigue  As directed     Call MD for:  temperature >100.4  As directed     Diet - low sodium heart healthy  As directed     Discharge instructions  As directed     Comments:      1) Oxygen: wear this at 3 liters ALL TIMES except when on BIPAP at which time you will bleed in 3 liters 2) BIPAP: MANDATORY EVERY EVENING  3) sliding scale insulin Check your blood sugar before meals and bedtime. Please record this in a paper notebook.  cbg 121-150: 2 units, 151-200: 3 units, 201-250: 5 units, 251-300: 8 units, 301-350: 11 units, 351-400: 15units    Increase activity slowly  As directed         Medication List    TAKE these medications       acetaminophen 500 MG tablet  Commonly known as:  TYLENOL  Take 500 mg by mouth every 6 (six) hours as needed for pain ("per bottle").     albuterol (2.5 MG/3ML) 0.083% nebulizer solution  Commonly known as:  PROVENTIL  Take 6 mLs (5 mg total) by nebulization 4 (four) times daily. Dx 496     bisoprolol 5 MG tablet  Commonly known as:  ZEBETA  Take 1 tablet (5 mg total) by mouth 2 (two) times daily.     budesonide 0.25 MG/2ML nebulizer solution  Commonly known as:  PULMICORT  Take 2 mLs (0.25 mg total) by nebulization 4 (four) times daily.     calcitRIOL 0.25 MCG capsule  Commonly known as:  ROCALTROL  Take 1 capsule (0.25 mcg total) by mouth daily.     furosemide 40 MG tablet  Commonly known as:  LASIX  Take 80-120 mg by mouth 2 (two) times daily.  Take 3 tablet - 120 mg daily in am an 2 tab 80 mg in evening     insulin aspart 100 UNIT/ML injection  Commonly known as:  NOVOLOG  Per Sliding Scale before meals: cbg 121-150: 2 units, 151-200: 3 units, 201-250: 5 units, 251-300: 8 units, 301-350: 11 units, 351-400: 15units     insulin glargine 100 UNIT/ML injection  Commonly known as:  LANTUS  Inject 0.2 mLs (20 Units total) into the skin at bedtime.     ipratropium  0.02 % nebulizer solution  Commonly known as:  ATROVENT  Take 2.5 mLs (0.5 mg total) by nebulization 4 (four) times daily. DX: 496     levofloxacin 750 MG tablet  Commonly known as:  LEVAQUIN  Take 1 tablet (750 mg total) by mouth every other day.     pantoprazole 40 MG tablet  Commonly known as:  PROTONIX  Take 40 mg by mouth daily.     paricalcitol 2 MCG capsule  Commonly known as:  ZEMPLAR  Take 2 mcg by mouth every other day.     simvastatin 40 MG tablet  Commonly known as:  ZOCOR  Take 40 mg by mouth at bedtime. Takes at bedtime.       Follow-up Information   Follow up with Carollee Herter, MD On 06/26/2012. (3pm)    Contact information:   [address] [place] [address] [phone]       Follow up with PARRETT,TAMMY, NP On 06/29/2012. (2pm )    Contact information:   [address] [place] [address] [phone]       Discharged Condition: good  Physician Statement:   The Patient was personally examined, the discharge assessment and plan has been personally reviewed and I agree with ACNP Babcock's assessment and plan. > 30 minutes of time have been dedicated to discharge assessment, planning and discharge instructions.   Signed: BABCOCK,PETE 06/23/2012, 12:32 PM   I have interviewed and examined the patient and reviewed the database. I have formulated the assessment and plan as reflected in the note above with amendments made by me.   Billy Fischer, MD;  PCCM service; Mobile [phone]

## 2012-06-23 NOTE — Progress Notes (Addendum)
CSW received report from ICU CSW , Doreen Salvage that CSW was discussing possibility of SNF with pt, but was awaiting PT evaluation.  Per PT evaluation and recommendation, PT recommends home with home health services and intermittent supervision. Pt insurance will not cover SNF placement at this time.  CSW and RNCM met with pt at bedside to discuss. Pt agreeable to return home with home health services.   Addendum 12:00pm: CSW received notification from [facility] that pt sister was concerned about pt returning home and RNCM checked with pt insurance about coverage for SNF even though PT recommending HH with intermittent supervision. Per RNCM, pt insurance stated that they would cover up to a week at SNF if that is what pt decided to do. Per RNCM, pt sister planned to discuss with pt to determine if pt would like to return home or go to SNF for a short time. CSW faxed pt information to SNF as pt had previously agreed to this.  CSW to continue to follow.  Jacklynn Lewis, MSW, LCSWA  Clinical Social Work [phone]

## 2012-06-23 NOTE — Evaluation (Signed)
Physical Therapy Evaluation Patient Details Name: [name] [name] MRN: [id number] DOB: 1940/11/08 Today's Date: 06/23/2012 Time: 2956-2130 PT Time Calculation (min): 19 min  PT Assessment / Plan / Recommendation Clinical Impression  Pt is a 72 year old male with COPD, OSA/OHS intubated for acute on chronic hypercarbic respiratory failure.  Pt reports he came in from home and was being seen for HHPT and per pt, making good progress.  Pt would benefit from acute PT in order to improve independence with transfers and ambulation to prepare for safe d/c home alone.    PT Assessment  Patient needs continued PT services    Follow Up Recommendations  Supervision - Intermittent;Home health PT    Does the patient have the potential to tolerate intense rehabilitation      Barriers to Discharge        Equipment Recommendations  None recommended by PT    Recommendations for Other Services     Frequency Min 3X/week    Precautions / Restrictions Precautions Precautions: Fall Precaution Comments: chronic O2 Restrictions Weight Bearing Restrictions: No   Pertinent Vitals/Pain SaO2 on 3L at rest 98% SaO2 after ambulation on 3L 93%     Mobility  Bed Mobility Bed Mobility: Supine to Sit Supine to Sit: 6: Modified independent (Device/Increase time);HOB elevated Transfers Transfers: Sit to Stand;Stand to Sit Sit to Stand: 4: Min guard;With upper extremity assist;From bed;From elevated surface Stand to Sit: 4: Min guard;With upper extremity assist;To chair/3-in-1 Details for Transfer Assistance: verbal cues for safe technique Ambulation/Gait Ambulation/Gait Assistance: 4: Min guard Ambulation Distance (Feet): 160 Feet Assistive device: Rolling walker Ambulation/Gait Assistance Details:  ambulated on 3L O2 with SaO2 93% upon returning to room, pt reports fatigue and LE weakness Gait Pattern: Step-through pattern General Gait Details: verbal cues for staying closer to RW    Exercises      PT Diagnosis: Difficulty walking;Generalized weakness  PT Problem List: Decreased strength;Decreased activity tolerance;Decreased mobility;Cardiopulmonary status limiting activity PT Treatment Interventions: DME instruction;Gait training;Patient/family education;Functional mobility training;Therapeutic activities;Therapeutic exercise   PT Goals Acute Rehab PT Goals PT Goal Formulation: With patient Time For Goal Achievement: 06/30/12 Potential to Achieve Goals: Good Pt will go Sit to Stand: with modified independence PT Goal: Sit to Stand - Progress: Goal set today Pt will go Stand to Sit: with modified independence PT Goal: Stand to Sit - Progress: Goal set today Pt will Ambulate: >150 feet;with modified independence;with least restrictive assistive device PT Goal: Ambulate - Progress: Goal set today Pt will Perform Home Exercise Program: with supervision, verbal cues required/provided PT Goal: Perform Home Exercise Program - Progress: Goal set today  Visit Information  Last PT Received On: 06/23/12 Assistance Needed: +1    Subjective Data  Subjective: I'm having trouble remembering things.   Prior Functioning  Home Living Lives With: Alone Type of Home: House Home Access: Level entry Home Layout: One level Home Adaptive Equipment: Walker - rolling Prior Function Level of Independence: Independent with assistive device(s) Comments: Pt reports no RW use in house but uses if outside of house. Communication Communication: No difficulties    Cognition  Cognition Arousal/Alertness: Awake/alert Behavior During Therapy: WFL for tasks assessed/performed Overall Cognitive Status: Impaired/Different from baseline Area of Impairment: Memory General Comments: pt states he has trouble remembering things recently    Extremity/Trunk Assessment Right Upper Extremity Assessment RUE ROM/Strength/Tone: [facility] for tasks assessed Left Upper Extremity Assessment LUE ROM/Strength/Tone:  WFL for tasks assessed Right Lower Extremity Assessment RLE ROM/Strength/Tone: Within functional levels Left  Lower Extremity Assessment LLE ROM/Strength/Tone: Within functional levels Trunk Assessment Trunk Assessment: Normal   Balance    End of Session PT - End of Session Equipment Utilized During Treatment: Oxygen Activity Tolerance: Patient tolerated treatment well Patient left: in chair;with call bell/phone within reach Nurse Communication: Other (comment) (staff in huddle, pt secretary aware pt in chair)  GP     Azilee Pirro,KATHrine E 06/23/2012, 9:18 AM Zenovia Jarred, PT, DPT 06/23/2012 Pager: [phone]

## 2012-06-23 NOTE — Care Management (Signed)
Cm spoke with patient at bedside with CSW present concerning discharge planning. Per PT eval, HHPT recommended. Per pt previously discharged from SNF with [facility] providing [facility] services. Per pt choice [facility] to continue providing HH services upon discharge. Pt states having home oxygen therapy & Bi-Pap provided by [facility]. Pt's adult sister [name] at [phone] whom CM spoke with per pt's permission whom states she will provide tx home and will bring pt's portable oxygen tank prior to discharge. No other needs assessed at this time. Awaiting resumption of care orders to fax to [facility] upon discharge.    Roxy Manns Oluwadamilola Deliz,RN,BSN [phone]

## 2012-06-23 NOTE — Progress Notes (Signed)
Meeting at bedside with patient, sister, nephew, inpatient LCSW, and inpatient RNCM to discuss discharge plans. Patient fortunately agreeable to going to SNF at discharge now.   Raiford Noble, MSN- Ed, Charity fundraiser, BSN- [facility] [phone]

## 2012-06-23 NOTE — Care Management (Signed)
Cm spoke with patient concerning discharge planning with pt's sister, nephew, CSW, and [facility] CM present at bedside. CM contacted pt's insurance which states pt will qualify for SNF placement for a maximum of 5 days. CM and CSW explained plan to patient and family whom decided best plan is for patient is SNF upon discharge. Pt's sister states pt has applied for Medicaid with possible approval for Long Term Care assistance to assist pt with permanent residence at [facility]. CSW to follow up with pt to dc to [facility] today. CM to sign off.   Roxy Manns Tyasia Packard,RN,BSN [phone]

## 2012-06-23 NOTE — Progress Notes (Signed)
CSW, RNCM, and [facility] CM met at bedside with pt, pt sister, and pt nephew. RNCM had contacted pt's insurance which states pt will qualify for SNF placement for a maximum of 5 days. CSW and RNCM explained plan to patient and family whom decided best plan is for patient is SNF upon discharge. Pt's sister confirmed that pt has applied for Medicaid with possible approval for Long Term Care assistance to assist pt with permanent residence at [facility] Pt and pt family are agreeable with pt discharging today to [facility]. CSW spoke to the facility who confirmed they could accept pt today.   CSW facilitated pt discharge needs including contacting facility, faxing pt discharge information via [facility], providing RN phone number to call report, discussing with pt and pt family at bedside, and pt sister plans to transport pt via private vehicle and has portable O2 in order to do so.  No further social work needs identified at this time.  CSW signing off.   Jacklynn Lewis, MSW, LCSWA  Clinical Social Work [phone]

## 2012-06-23 NOTE — Clinical Social Work Note (Signed)
CSW working on SNF placement. Will begin bed search once PT eval complete. Pt and sister prefer [facility] for SNF.   Doreen Salvage, LCSW ICU/Stepdown Clinical Social Worker [facility] Cell [phone] Hours 8am-1200pm M-F

## 2012-06-23 NOTE — Progress Notes (Signed)
Mr [name] [name] is active with [facility] Care Management services. [facility] Community Care Coordinator has been working with patient with diabetes management among other things. Community Care Coordinator has concerns about patient going home instead of SNF. He has fallen multiple times at home and does not manage his diabetes well. Spoke with inpatient RNCM and inpatient LCSW regarding discharge plans. Will follow up.  Raiford Noble, MSN- Ed, Charity fundraiser, BSN- -[facility] [phone]

## 2012-06-23 NOTE — Progress Notes (Signed)
Clinical Social Work Department CLINICAL SOCIAL WORK PLACEMENT NOTE 06/23/2012  Patient:  [name] [name], [name] [name]  Account Number:  [id number] Admit date:  06/19/2012  Clinical Social Worker:  Jacelyn Grip  Date/time:  06/23/2012 02:30 PM  Clinical Social Work is seeking post-discharge placement for this patient at the following level of care:   SKILLED NURSING   (*CSW will update this form in Epic as items are completed)   06/23/2012  Patient/family provided with [facility] Department of Clinical Social Work's list of facilities offering this level of care within the geographic area requested by the patient (or if unable, by the patient's family).  06/23/2012  Patient/family informed of their freedom to choose among providers that offer the needed level of care, that participate in Medicare, Medicaid or managed care program needed by the patient, have an available bed and are willing to accept the patient.  06/23/2012  Patient/family informed of [facility]' ownership interest in [facility], as well as of the fact that they are under no obligation to receive care at this facility.  PASARR submitted to EDS on 06/23/2012 PASARR number received from EDS on 06/23/2012  FL2 transmitted to all facilities in geographic area requested by pt/family on  06/23/2012 FL2 transmitted to all facilities within larger geographic area on   Patient informed that his/her managed care company has contracts with or will negotiate with  certain facilities, including the following:     Patient/family informed of bed offers received:  06/23/2012 Patient chooses bed at [facility] [place] Physician recommends and patient chooses bed at    Patient to be transferred to [facility] on  06/23/2012 Patient to be transferred to facility by pt sister via private vehicle  The following physician request were entered in Epic:   Additional Comments:    Jacklynn Lewis,  MSW, LCSWA  Clinical Social Work [phone]

## 2012-06-23 NOTE — Progress Notes (Signed)
Attempted to call report to [facility], message was left for nurse to return call.

## 2012-06-23 NOTE — Progress Notes (Signed)
Pt. Was discharged to SNF, discharge packet was sent with pt. He was transported to facility by his sister.

## 2012-06-26 ENCOUNTER — Inpatient Hospital Stay: Payer: Medicare Other | Admitting: Family Medicine

## 2012-06-26 LAB — CULTURE, BLOOD (ROUTINE X 2): Culture: NO GROWTH

## 2012-06-29 ENCOUNTER — Ambulatory Visit (INDEPENDENT_AMBULATORY_CARE_PROVIDER_SITE_OTHER): Payer: Medicare Other | Admitting: Adult Health

## 2012-06-29 ENCOUNTER — Encounter: Payer: Self-pay | Admitting: Adult Health

## 2012-06-29 VITALS — BP 126/66 | HR 79 | Temp 97.3°F | Ht 68.0 in | Wt 240.6 lb

## 2012-06-29 DIAGNOSIS — J9610 Chronic respiratory failure, unspecified whether with hypoxia or hypercapnia: Secondary | ICD-10-CM

## 2012-06-29 NOTE — Addendum Note (Signed)
Addended by: Boone Master E on: 06/29/2012 05:13 PM   Modules accepted: Orders

## 2012-06-29 NOTE — Assessment & Plan Note (Signed)
Recent flare w/ hypercarbia due to BIPAP noncompliance And COPD flare  Advised  To wear BIPAP  Orders sent to NH on need for BIPAP with sleep -naps/bedtime  follow up 4 weeks with Dr. Shelle Iron  follow up with Dr. Sherene Sires  In 6 weeks.  follow up with PCP as planned next week.

## 2012-06-29 NOTE — Patient Instructions (Addendum)
Make sure you wear you BIPAP with naps and At bedtime   Continue with O2 at 3lm continuous flow .  Follow up with Clance in 4 weeks  Follow up  With Dr. Sherene Sires  In 6 weeks  Continue on Budesonide Neb Twice daily   Continue on [name] .Four times a day   Please contact office for sooner follow up if symptoms do not improve or worsen or seek emergency care

## 2012-06-29 NOTE — Progress Notes (Signed)
* Subjective:    Patient ID: [name] [name], male    DOB: May 27, 1940    MRN: [id number]  HPI 36 yobm with morbid obesity actively smoking followed in pulmonary clinic for copd/ chronic resp failure - PFTs 10/13/07 FEV1 of 45%, ratio 63%, FVC 15% improvement after bronchodilators DLCO 45%    05/04/2011 Post Hospital follow up  Admitted 04/16/2011 and discharged 04/28/2011 for acute on chronic hypercarbic/hypoxic respiratory failure, PNA -RLL, volume overload/pulmonary edema w/ decompensated on cor pulmonale ,AECOPD vs acei effect w/ ongoing smoking. Tx with IV abx, steroids, diuresis, BIPAP. Discharged on Levaquin and steroid taper.   Cc  breathing has improved since discharge but still has some shortness of breath with exertion along with weakness and fatigue. Minimum cough. Clear mucus production with occasional dark red tinged sputum. BLE edema, greater on right than left. Uses BiPAP at night and states that he's sleeping much better now than he has in years. Has completed discharge antibiotics. Uses nebulizer's three-  four times daily and is completing current steroid taper.   On 5L continuous O2 and reports smoking 4-6 cigarettes since discharge. Hx  5 ppd smoking at his max.   Admits to smoking inside home and with removal of oxygen in a separate room. Will occasionally leave oxygen system on.  CXR >  improved aeration with some residual opacity in RLL  rec Finish Prednisone taper as directed Low salt diet.  Continue Xopenex and Atrovent Neb Four times a day   Very important to stop smoking.  No smoking in house - with oxygen -it is very dangerous-flammable.  Legs elevated.  Follow up Dr. Sherene Sires  In 3 weeks Dr. Sherene Sires   follow up Dr. Shelle Iron as planned for sleep consult.   05/27/2011 f/u ov/Wert cc "cpap working better" (record says it's bipap and he has new machine) wearing 02  at hs and practically all day on 02 at 5lpm breathing better than he has in a year now off acei and actually ran out of  albuterol > 1 week prior to OV  s effect.  Thoroughly confused with names of meds/ machines/ 02 rx.  No purulent sputum. Leg swelling better >>labs-improved renal fxn off ace  and xray -w / residual opacity at right base.   09/10/2011 Follow up and Med review  Returns for a three-month followup and medication review. We reviewed all his medications and organized them into a medication calendar. Says that he is at his baseline. Has daily cough and congestion mainly clear to white mucus. Continues to smoke with no plans on quitting. Smoking cessation discussed. Is having difficulty affording his ipratropium nebulizer through the pharmacy. We discussed getting his nebulizer through the DME company rec Add Budesonide Neb 0.25mg  Twice daily   Continue on Ipratropium Neb Four times a day  (sent rx to DME company -[employer] )  May get saline nasal spray for nasal stuffiness May get mucinex DM Twice daily  As needed  Cough/congestion  Follow med calendar closely and bring to each visit.  follow up Dr. Sherene Sires  In  3 months and As needed   I will call with xray results. > Chronic lung changes consistent with COPD.  Minimal atelectasis or scarring at bases.   12/16/2011 f/u ov/Wert cc missed appt  01/25/2012 f/u ov/Wert cc missed appt  05/30/12 [name] for osa/ohs rec Will get your equipment company to get you new mask, supplies, and check your bipap machine.  Your machine may need to be  replaced. Work on [employer] loss  06/13/2012 f/u ov/Wert f/u   Copd/ chronic resp failure Chief Complaint  Patient presents with  . Follow-up    pt c/o increased SOB, productive cough with tan phlegm since last OV.   wearing 3lpm not using bipap since last ov with [facility] and not saturating well  on 3lpm pulsed on arrival, overal worse since last ov, thoroughly confused with meds, nebs.    No obvious daytime variabilty or assoc  or cp or chest tightness, subjective wheeze overt sinus or hb symptoms. No unusual exp hx  or h/o childhood pna/ asthma or premature birth to his knowledge.  >>O2 increased to 3l/m at rest and 5 l/m walking    06/29/2012 Post Hospital follow up  Admitted 4/21-4/25 for acute encephalopathy due to hypercarbic resp failure from BIPAP and med non compliance.  Required brief intubation/vent support .  Tx w/ empiric abx, steroid for COPD flare .  Discharged on Levaquin every other day .  Finishes  Levaquin in 3 days .  Discharged to [facility]. Not wearing BIPAP since discharge.  We discussed in detail with pt and family member importance of BIPAP. He says he did not know that wearing BIPAP was that important.  Says he doing better but still weak.  No fever, increased leg swelling, n/v. Or chest pain.   ROS:  Constitutional:   No  weight loss, night sweats,  Fevers, chills,  +fatigue, or  lassitude.  HEENT:   No headaches,  Difficulty swallowing,  Tooth/dental problems, or  Sore throat,                No sneezing, itching, ear ache, nasal congestion, post nasal drip,   CV:  No chest pain,  Orthopnea, PND, swelling in lower extremities, anasarca, dizziness, palpitations, syncope.   GI  No heartburn, indigestion, abdominal pain, nausea, vomiting, diarrhea, change in bowel habits, loss of appetite, bloody stools.   Resp:   No coughing up of blood.  No change in color of mucus.  No wheezing.  No chest wall deformity  Skin: no rash or lesions.  GU: no dysuria, change in color of urine, no urgency or frequency.  No flank pain, no hematuria   MS:  No joint pain or swelling.  No decreased range of motion.  No back pain.  Psych:  No change in mood or affect. No depression or anxiety.              Objective:     GEN: obese,   elderly  AAM in NAD  HEENT:  Oral pharyngeal pharynx clear with no lesions, no postnasal drip or exudate noted.   NECK:  Supple w/ fair ROM. No JVD. Normal carotid impulses w/o bruits; no thyromegaly or nodules palpated; no lymphadenopathy.  RESP:   distanat bs, trace end exp wheeze   CARD:  RRR. No murmurs, rubs or gallops. 1 +edema to BLE pulses intact. No cyanosis or clubbing.  GI:   Obese. soft & nontender. Bowel sounds present in all quadrants. No organomegaly or masses detected.  Musco: Warm bil, no deformities or joint swelling noted.   Neuro: alert and oriented x 4. no focal deficits noted.    Skin: Warm and dry. No lesions or rashes  .  CXR 4/23 >nad    Assessment & Plan:

## 2012-06-30 ENCOUNTER — Encounter: Payer: Self-pay | Admitting: Internal Medicine

## 2012-06-30 ENCOUNTER — Non-Acute Institutional Stay (SKILLED_NURSING_FACILITY): Payer: Medicare Other | Admitting: Internal Medicine

## 2012-06-30 DIAGNOSIS — R5381 Other malaise: Secondary | ICD-10-CM

## 2012-06-30 DIAGNOSIS — J441 Chronic obstructive pulmonary disease with (acute) exacerbation: Secondary | ICD-10-CM

## 2012-06-30 DIAGNOSIS — I5032 Chronic diastolic (congestive) heart failure: Secondary | ICD-10-CM

## 2012-06-30 DIAGNOSIS — D649 Anemia, unspecified: Secondary | ICD-10-CM

## 2012-06-30 DIAGNOSIS — E119 Type 2 diabetes mellitus without complications: Secondary | ICD-10-CM

## 2012-06-30 DIAGNOSIS — N189 Chronic kidney disease, unspecified: Secondary | ICD-10-CM

## 2012-06-30 DIAGNOSIS — R5383 Other fatigue: Secondary | ICD-10-CM

## 2012-06-30 DIAGNOSIS — I10 Essential (primary) hypertension: Secondary | ICD-10-CM

## 2012-06-30 DIAGNOSIS — G4733 Obstructive sleep apnea (adult) (pediatric): Secondary | ICD-10-CM

## 2012-06-30 DIAGNOSIS — R531 Weakness: Secondary | ICD-10-CM

## 2012-06-30 NOTE — Progress Notes (Signed)
Date: 06/30/2012  MRN:  [id number] Name:  [name] [name] Sex:  male Age:  72 y.o. DOB:05-18-1940   PSC #:                       Facility/Room:  [facility] Level Of Care:  SNF Provider: Kermit Balo, DO, CMD  Emergency Contacts: Contact Information   Name Relation Home Work Mobile   [place] Sister [phone]     Simms,Fannie Sister [phone]     Josepha Pigg [phone]     Cheri Fowler Daughter [phone] [id number]        Code Status:  full  Allergies:No Known Allergies   Chief Complaint  Patient presents with  . Hospitalization Follow-up    new admission   HPI:  72 yo male here for short term rehab s/p hospitalization for copd exacerbation.  He is a vasculopath and is not adherent with medications and bipap per records.  Has become quite weak after last hospital stay and needs strengthening before he can return home.  Past Medical History  Diagnosis Date  . Renal insufficiency   . Diabetes mellitus   . GERD (gastroesophageal reflux disease)   . Gastroparesis   . Esophageal stricture   . Hyperlipemia   . Hypertension   . Pancreatitis   . Chronic respiratory disease   . Depression   . Seizure disorder   . Arthritis   . Diverticulosis   . Sleep apnea     CPAP Machine  . Obesity   . ED (erectile dysfunction)   . Sleep apnea   . COPD (chronic obstructive pulmonary disease)     Gold Stage  III/IV  . H/O polycythemia   . Chronic kidney disease (CKD), stage III (moderate)   . DDD (degenerative disc disease), lumbar   . History of esophageal stricture   . Erectile dysfunction   . Prostate cancer   . Peptic ulcer disease   . History of colon polyps   . Gout   . Gastroparesis     Past Surgical History  Procedure Laterality Date  . Prostate surgery    . Coronary artery bypass graft      x 4  . Bone marrow biopsy    . Cardiac catheterization  05/02/2008  . Back surgery      lumbar     Procedures: Consultants:  Current  Outpatient Prescriptions  Medication Sig Dispense Refill  . ACCU-CHEK AVIVA PLUS test strip Use as directed to check blood glucose      . ACCU-CHEK SOFTCLIX LANCETS lancets Use as directed to check blood glucose      . acetaminophen (TYLENOL) 500 MG tablet Take 500 mg by mouth every 6 (six) hours as needed for pain ("per bottle").      . albuterol (PROVENTIL) (2.5 MG/3ML) 0.083% nebulizer solution Take 6 mLs (5 mg total) by nebulization 4 (four) times daily. Dx 496  75 mL  6  . bisoprolol (ZEBETA) 5 MG tablet Take 1 tablet (5 mg total) by mouth 2 (two) times daily.  60 tablet  4  . budesonide (PULMICORT) 0.25 MG/2ML nebulizer solution Take 2 mLs (0.25 mg total) by nebulization 4 (four) times daily.  60 mL  6  . calcitRIOL (ROCALTROL) 0.25 MCG capsule Take 1 capsule (0.25 mcg total) by mouth daily.  30 capsule  4  . furosemide (LASIX) 40 MG tablet Take 3 tablet - 120 mg daily in am an 2 tab 80 mg in evening      .  insulin aspart (NOVOLOG) 100 UNIT/ML injection Per Sliding Scale before meals: cbg 121-150: 2 units, 151-200: 3 units, 201-250: 5 units, 251-300: 8 units, 301-350: 11 units, 351-400: 15units  3 pen  4  . insulin glargine (LANTUS) 100 UNIT/ML injection Inject 0.2 mLs (20 Units total) into the skin at bedtime.  10 mL  6  . ipratropium (ATROVENT) 0.02 % nebulizer solution Take 2.5 mLs (0.5 mg total) by nebulization 4 (four) times daily. DX: 496  300 mL  5  . levofloxacin (LEVAQUIN) 750 MG tablet Take 1 tablet (750 mg total) by mouth every other day.  2 tablet  0  . pantoprazole (PROTONIX) 40 MG tablet Take 40 mg by mouth daily.      . paricalcitol (ZEMPLAR) 2 MCG capsule Take 2 mcg by mouth every other day.      . simvastatin (ZOCOR) 40 MG tablet Take 40 mg by mouth at bedtime.        No current facility-administered medications for this visit.    Immunization History  Administered Date(s) Administered  . DTP 02/28/2004  . H1N1 01/03/2009, 01/02/2010  . Influenza Split 03/09/2011  .  Influenza Whole 11/04/2010  . PPD Test 06/27/2012  . Pneumococcal Conjugate 03/01/2003   History  Substance Use Topics  . Smoking status: Former Smoker -- 1.00 packs/day for 60 years    Types: Cigarettes    Quit date: 05/12/2012  . Smokeless tobacco: Never Used  . Alcohol Use: No     Comment: former heavy drinker    Family History  Problem Relation Age of Onset  . Colon cancer Neg Hx   . Cancer Brother     unsure what kind  . Heart disease Mother   . Heart disease Father   . Emphysema Father      Review of Systems  Respiratory: Positive for shortness of breath.   Constitutional: Negative for fever and chills.  HENT: Negative for congestion.   Eyes: Negative for blurred vision.  Cardiovascular: Negative for chest pain and palpitations.  Gastrointestinal: Negative for abdominal pain.  Genitourinary: Negative for dysuria.  Musculoskeletal: Negative for falls.  Skin: Negative for rash.  Neurological: Negative for dizziness, focal weakness, weakness and headaches.  Psychiatric/Behavioral: Negative for depression. Constitutional: Vital signs reviewed.    Vital signs: BP 133/73  Pulse 76  Temp(Src) 99.6 F (37.6 C)  Resp 20  Ht 5\' 7"  (1.702 m)  Wt 242 lb (109.77 kg)  BMI 37.89 kg/m2  SpO2 98% Physical Exam  Constitutional: He appears well-developed and well-nourished.  Head: Normocephalic and atraumatic Mouth: no erythema or exudates, MMM Eyes: PERRL, EOMI, conjunctivae normal, No scleral icterus.  Neck: Supple, Trachea midline normal ROM, No JVD, mass, thyromegaly, or carotid bruit present.  Cardiovascular: RRR, S1 normal, S2 normal, no MRG, pulses symmetric and intact bilaterally Pulmonary/Chest: CTAB, no wheezes, rales, or rhonchi Abdominal: Soft. Non-tender, non-distended, bowel sounds are normal, no masses, organomegaly, or guarding present.  Musculoskeletal: No joint deformities, erythema, or stiffness, ROM full and no nontender Ext: trace edema and no cyanosis,  pulses palpable bilaterally (DP and PT) Neurological: A&O x3, Strenght is normal and symmetric bilaterally, cranial nerve II-XII are grossly intact, no focal motor deficit, sensory intact to light touch bilaterally.  Skin: Warm, dry and intact. No rash, cyanosis, or clubbing.  Psychiatric: Normal mood and affect. speech and behavior is normal. Judgment and thought content normal. Cognition and memory are normal.  Plan:  COPD Reviewed PFT from 2009,  Chronic hypercarbia. On bipap  at present with bronchodilators.will continue albuterol, atrovent and budosenide. Counseled about smoking cessation.  Admitted 4/21-4/25 for acute encephalopathy due to hypercarbic resp failure from BIPAP and med non compliance.  Required brief intubation/vent support . Tx w/ empiric abx, steroid for COPD flare .  Discharged on Levaquin every other day .  Finishes Levaquin in 2 days .   OSA (obstructive sleep apnea) Continue bipap at bedtime--he had not been wearing it until pulmonary reviewed importance yesterday  HYPERTENSION Reviewed bp in facility. Continue bp monitoring. On bisoprolol 5 mg bid  CHF (congestive heart failure) Stable. Reviewed echocardiogram - ef 55%, grade 2 diastolic dysfunction. Continue lasix 40 mg bid, bisoprolol and statin. Monitor weight daily, check bmp.  Will adjust diuretics if needed.  Cardiac prudent diet.  DIABETES MELLITUS, TYPE II hba1c was 7.2 on 3/14--would repeat in June if he is still here for therapy. cbg from hospital reviewed. On lantus 10 u bid. Continue cbg monitor  HYPERLIPIDEMIA Continue simvastatin for now and monitor  ESOPHAGEAL STRICTURE Continue PPI at current dose and monitor  Encephalopathy acute Chronic co2 retention likely cause. Normal tsh and b12.   Anemia Low hb/hct on discharge from hospital. Recheck cbc  Chronic renal failure Dm and HTN contributing to this. Avoid nsaids. On lasix for chf. Monitor bmp  Weakness generalized Here for  rehabilitation. Will have him work with PT/OT, monitor po intake and nutritional status. Fall precautions. Goal is for pt to return home.

## 2012-07-03 ENCOUNTER — Telehealth: Payer: Self-pay | Admitting: Adult Health

## 2012-07-03 NOTE — Telephone Encounter (Signed)
I spoke with [name] [name]. She stated they don't provide anything when someone is in a nursing facility. Please advise PCC's thanks

## 2012-07-03 NOTE — Telephone Encounter (Signed)
Order was sent to [facility] because [facility] said pt was not their's

## 2012-07-03 NOTE — Telephone Encounter (Signed)
Please advise PCC's. Confused by order states it was sent to [facility] then stated it was sent to [facility]. thanks

## 2012-07-04 ENCOUNTER — Inpatient Hospital Stay: Payer: Medicare Other | Admitting: Family Medicine

## 2012-07-04 NOTE — Telephone Encounter (Signed)
Spoke to pt's [email or web address] she will call [email or web address]  and work this bipap download out for this pt [name] [name]

## 2012-07-12 ENCOUNTER — Telehealth: Payer: Self-pay | Admitting: Family Medicine

## 2012-07-17 ENCOUNTER — Encounter: Payer: Self-pay | Admitting: Internal Medicine

## 2012-07-17 NOTE — Telephone Encounter (Signed)
LM

## 2012-07-26 ENCOUNTER — Non-Acute Institutional Stay (SKILLED_NURSING_FACILITY): Payer: Medicare Other | Admitting: Internal Medicine

## 2012-07-26 DIAGNOSIS — E119 Type 2 diabetes mellitus without complications: Secondary | ICD-10-CM

## 2012-07-26 NOTE — Progress Notes (Signed)
Patient ID: [name] [name], male   DOB: 1940/09/14, 72 y.o.   MRN: [id number]  [facility]  Chief Complaint: high sugar  HPI: 72 yo male with diabetes on insulin concerned his glucose levels have been running high.  Upon review of cbgs, this is accurate--all readings have been over 150 since he has been here.  However, nursing staff note that he spends the day at the snack machine--I have discussed this with him and that watching sweets and starchy snacks is the first step to lowering his glucose.    Review of Systems:  Review of Systems  Respiratory: Positive for cough, shortness of breath and wheezing.   Cardiovascular: Negative for chest pain.  Gastrointestinal: Negative for abdominal pain.  Genitourinary: Negative for frequency.  Musculoskeletal: Negative for falls.  Endo/Heme/Allergies:       High glucose readings    Medications: Patient's Medications  New Prescriptions   No medications on file  Previous Medications   ACCU-CHEK AVIVA PLUS TEST STRIP    Use as directed to check blood glucose   ACCU-CHEK SOFTCLIX LANCETS LANCETS    Use as directed to check blood glucose   ACETAMINOPHEN (TYLENOL) 500 MG TABLET    Take 500 mg by mouth every 6 (six) hours as needed for pain ("per bottle").   ALBUTEROL (PROVENTIL) (2.5 MG/3ML) 0.083% NEBULIZER SOLUTION    Take 6 mLs (5 mg total) by nebulization 4 (four) times daily. Dx 496   BISOPROLOL (ZEBETA) 5 MG TABLET    Take 1 tablet (5 mg total) by mouth 2 (two) times daily.   BUDESONIDE (PULMICORT) 0.25 MG/2ML NEBULIZER SOLUTION    Take 2 mLs (0.25 mg total) by nebulization 4 (four) times daily.   CALCITRIOL (ROCALTROL) 0.25 MCG CAPSULE    Take 1 capsule (0.25 mcg total) by mouth daily.   FUROSEMIDE (LASIX) 40 MG TABLET    Take 3 tablet - 120 mg daily in am an 2 tab 80 mg in evening   INSULIN ASPART (NOVOLOG) 100 UNIT/ML INJECTION    Per Sliding Scale before meals: cbg 121-150: 2 units, 151-200: 3 units, 201-250: 5 units, 251-300:  8 units, 301-350: 11 units, 351-400: 15units   INSULIN GLARGINE (LANTUS) 100 UNIT/ML INJECTION    Inject 0.2 mLs (20 Units total) into the skin at bedtime.   IPRATROPIUM (ATROVENT) 0.02 % NEBULIZER SOLUTION    Take 2.5 mLs (0.5 mg total) by nebulization 4 (four) times daily. DX: 496   LEVOFLOXACIN (LEVAQUIN) 750 MG TABLET    Take 1 tablet (750 mg total) by mouth every other day.   PANTOPRAZOLE (PROTONIX) 40 MG TABLET    Take 40 mg by mouth daily.   PARICALCITOL (ZEMPLAR) 2 MCG CAPSULE    Take 2 mcg by mouth every other day.   SIMVASTATIN (ZOCOR) 40 MG TABLET    Take 40 mg by mouth at bedtime.   Modified Medications   No medications on file  Discontinued Medications   No medications on file   Physical Exam: Physical Exam  Cardiovascular: Normal rate, regular rhythm, normal heart sounds and intact distal pulses.   Pulmonary/Chest: Effort normal. He has wheezes. He has no rales.  Abdominal: Soft. Bowel sounds are normal. He exhibits no distension. There is no tenderness.  Neurological: He is alert.        Labs reviewed: Basic Metabolic Panel:  Recent Labs  14/78/29 0152 06/21/12 0430 06/22/12 0500 06/23/12 0502  NA 146* 142 142 138  K 3.4* 4.6 4.5  4.8  CL 101 95* 96 96  CO2 43* 43* 39* 39*  GLUCOSE 128* 246* 242* 272*  BUN 30* 51* 48* 47*  CREATININE 1.71* 2.10* 1.93* 1.98*  CALCIUM 8.5 9.6 10.0 9.5  MG 1.5  --   --   --   PHOS 2.2*  --   --   --     Liver Function Tests:  Recent Labs  05/25/12 1543 06/20/12 0152 06/22/12 0500  AST 13 11 14   ALT 14 6 8   ALKPHOS 72 54 59  BILITOT 0.5 0.5 0.4  PROT 6.6 5.7* 6.8  ALBUMIN 3.8 2.7* 3.2*    CBC:  Recent Labs  05/12/12 1300  05/25/12 1543 06/19/12 2205 06/21/12 0430 06/22/12 0500  WBC 14.1*  < > 6.8 14.4* 13.1* 17.1*  NEUTROABS 10.5*  --  3.2 9.3*  --   --   HGB 11.5*  < > 11.3* 13.2 11.6* 12.4*  HCT 40.9  < > 36.3* 44.0 37.9* 40.3  MCV 101.0*  < > 89.2 97.1 94.0 93.5  PLT 33*  < > 26* 65* 51* 68*  < >  = values in this interval not displayed. Lab Results  Component Value Date   HGBA1C 7.2* 05/12/2012    Assessment/Plan 1. Type II or unspecified type diabetes mellitus without mention of complication, not stated as uncontrolled --hba1c 7.2 last eval, but readings running over 150 all of the time now --on lantus 10 units [place] bid --add novolog 5 units ac meals for cbgs over 150 and notify md/np if readings continue to remain over 200 --counseled on watching his diet and avoiding the snack machine  Family/ staff Communication: reviewed with nurse  Labs/tests ordered:  Check hba1c in June if still here

## 2012-07-27 ENCOUNTER — Ambulatory Visit: Payer: Medicare Other | Admitting: Pulmonary Disease

## 2012-08-01 ENCOUNTER — Non-Acute Institutional Stay (SKILLED_NURSING_FACILITY): Payer: Medicare Other | Admitting: Adult Health

## 2012-08-01 DIAGNOSIS — N058 Unspecified nephritic syndrome with other morphologic changes: Secondary | ICD-10-CM

## 2012-08-01 DIAGNOSIS — J449 Chronic obstructive pulmonary disease, unspecified: Secondary | ICD-10-CM

## 2012-08-01 DIAGNOSIS — E1165 Type 2 diabetes mellitus with hyperglycemia: Secondary | ICD-10-CM

## 2012-08-01 DIAGNOSIS — I10 Essential (primary) hypertension: Secondary | ICD-10-CM

## 2012-08-01 DIAGNOSIS — IMO0002 Reserved for concepts with insufficient information to code with codable children: Secondary | ICD-10-CM

## 2012-08-07 ENCOUNTER — Telehealth: Payer: Self-pay | Admitting: Family Medicine

## 2012-08-08 ENCOUNTER — Ambulatory Visit (INDEPENDENT_AMBULATORY_CARE_PROVIDER_SITE_OTHER): Payer: Medicare Other | Admitting: Internal Medicine

## 2012-08-08 ENCOUNTER — Telehealth: Payer: Self-pay | Admitting: Internal Medicine

## 2012-08-08 ENCOUNTER — Encounter: Payer: Self-pay | Admitting: Internal Medicine

## 2012-08-08 VITALS — BP 130/70 | HR 77 | Temp 97.4°F | Ht 68.0 in | Wt 252.0 lb

## 2012-08-08 DIAGNOSIS — I279 Pulmonary heart disease, unspecified: Secondary | ICD-10-CM

## 2012-08-08 DIAGNOSIS — I2781 Cor pulmonale (chronic): Secondary | ICD-10-CM

## 2012-08-08 DIAGNOSIS — J449 Chronic obstructive pulmonary disease, unspecified: Secondary | ICD-10-CM

## 2012-08-08 DIAGNOSIS — J9610 Chronic respiratory failure, unspecified whether with hypoxia or hypercapnia: Secondary | ICD-10-CM

## 2012-08-08 NOTE — Patient Instructions (Addendum)
No change in medications for now  If breathing gets worse make appt to see Tammy NP  with all your medications, even over the counter meds, separated in two separate bags, the ones you take no matter what vs the ones you stop once you feel better and take only as needed when you feel you need them.   Tammy  will generate for you a new user friendly medication calendar that will put [place] all on the same page re: your medication use.     Without this process, it simply isn't possible to assure that we are providing  your outpatient care  with  the attention to detail we feel you deserve.   If we cannot assure that you're getting that kind of care,  then we cannot manage your problem effectively from this clinic.  Once you have seen Tammy and we are sure that we're all on the same page with your medication use she will arrange follow up with me.

## 2012-08-08 NOTE — Progress Notes (Signed)
* Subjective:    Patient ID: [name] [name], male    DOB: 02/26/1941    MRN: [id number]  HPI 37 yobm with morbid obesity actively smoking followed in pulmonary clinic for copd/ chronic resp failure - PFTs 10/13/07 FEV1 of 45%, ratio 63%, FVC 15% improvement after bronchodilators DLCO 45%    05/04/2011 Post Hospital follow up  Admitted 04/16/2011 and discharged 04/28/2011 for acute on chronic hypercarbic/hypoxic respiratory failure, PNA -RLL, volume overload/pulmonary edema w/ decompensated on cor pulmonale ,AECOPD vs acei effect w/ ongoing smoking. Tx with IV abx, steroids, diuresis, BIPAP. Discharged on Levaquin and steroid taper.   Cc  breathing has improved since discharge but still has some shortness of breath with exertion along with weakness and fatigue. Minimum cough. Clear mucus production with occasional dark red tinged sputum. BLE edema, greater on right than left. Uses BiPAP at night and states that he's sleeping much better now than he has in years. Has completed discharge antibiotics. Uses nebulizer's three-  four times daily and is completing current steroid taper.   On 5L continuous O2 and reports smoking 4-6 cigarettes since discharge. Hx  5 ppd smoking at his max.   Admits to smoking inside home and with removal of oxygen in a separate room. Will occasionally leave oxygen system on.  CXR >  improved aeration with some residual opacity in RLL  rec Finish Prednisone taper as directed Low salt diet.  Continue Xopenex and Atrovent Neb Four times a day   Very important to stop smoking.  No smoking in house - with oxygen -it is very dangerous-flammable.  Legs elevated.  Follow up Dr. Sherene Sires  In 3 weeks Dr. Sherene Sires   follow up Dr. Shelle Iron as planned for sleep consult.   05/27/2011 f/u ov/[name] [name] cc "cpap working better" (record says it's bipap and he has new machine) wearing 02  at hs and practically all day on 02 at 5lpm breathing better than he has in a year now off acei and actually ran out of  albuterol > 1 week prior to OV  s effect.  Thoroughly confused with names of meds/ machines/ 02 rx.  No purulent sputum. Leg swelling better >>labs-improved renal fxn off ace  and xray -w / residual opacity at right base.   09/10/2011 Follow up and Med review  Returns for a three-month followup and medication review. We reviewed all his medications and organized them into a medication calendar. Says that he is at his baseline. Has daily cough and congestion mainly clear to white mucus. Continues to smoke with no plans on quitting. Smoking cessation discussed. Is having difficulty affording his ipratropium nebulizer through the pharmacy. We discussed getting his nebulizer through the DME company rec Add Budesonide Neb 0.25mg  Twice daily   Continue on Ipratropium Neb Four times a day  (sent rx to DME company -[facility] )  May get saline nasal spray for nasal stuffiness May get mucinex DM Twice daily  As needed  Cough/congestion  Follow med calendar closely and bring to each visit.  follow up Dr. Sherene Sires  In  3 months and As needed   I will call with xray results. > Chronic lung changes consistent with COPD.  Minimal atelectasis or scarring at bases.   12/16/2011 f/u ov/[name] [name] cc missed appt  01/25/2012 f/u ov/[name] [name] cc missed appt  05/30/12 [name] [name] for osa/ohs rec Will get your equipment company to get you new mask, supplies, and check your bipap machine.  Your machine may need to be  replaced. Work on [employer] loss  06/13/2012 f/u ov/[name] [name] f/u   Copd/ chronic resp failure Chief Complaint  Patient presents with  . Follow-up    pt c/o increased SOB, productive cough with tan phlegm since last OV.   wearing 3lpm not using bipap since last ov with [name] and not saturating well  on 3lpm pulsed on arrival, overal worse since last ov, thoroughly confused with meds, nebs.    No obvious daytime variabilty or assoc  or cp or chest tightness, subjective wheeze overt sinus or hb symptoms. No unusual exp hx  or h/o childhood pna/ asthma or premature birth to his knowledge.  >>O2 increased to 3l/m at rest and 5 l/m walking    06/29/2012 Post Hospital follow up  Admitted 4/21-4/25 for acute encephalopathy due to hypercarbic resp failure from BIPAP and med non compliance.  Required brief intubation/vent support .  Tx w/ empiric abx, steroid for COPD flare .  Discharged on Levaquin every other day .  Finishes  Levaquin in 3 days .  Discharged to [facility]. Not wearing BIPAP since discharge.  We discussed in detail with pt and family member importance of BIPAP. He says he did not know that wearing BIPAP was that important.  Says he doing better but still weak.  rec Make sure you wear you BIPAP with naps and At bedtime   Continue with O2 at 3lm continuous flow .  Follow up with [name] in 4 weeks  Follow up  With Dr. Sherene Sires  In 6 weeks  Continue on Budesonide Neb Twice daily   Continue on [name] .Four times a day   08/08/2012 f/u ov/[name] [name] re chronic resp failure / sleeping on bipap Chief Complaint  Patient presents with  . Follow-up    Pt states that his breathing has improved since the last visit. No new co's today.   still doing poorly with noct breathing despite bibap, swelling is better Mucus is clear.  No obvious daytime variabilty or assoc   cp or chest tightness, subjective wheeze overt sinus or hb symptoms. No unusual exp hx or h/o childhood pna/ asthma or premature birth to his knowledge.   . Also denies any obvious fluctuation of symptoms with weather or environmental changes or other aggravating or alleviating factors except as outlined above       Current Medications, Allergies, Past Medical History, Past Surgical History, Family History, and Social History were reviewed in [name] [name] record.  ROS  The following are not active complaints unless bolded sore throat, dysphagia, dental problems, itching, sneezing,  nasal congestion or excess/ purulent  secretions, ear ache,   fever, chills, sweats, unintended wt loss, pleuritic or exertional cp, hemoptysis,  orthopnea pnd or leg swelling, presyncope, palpitations, heartburn, abdominal pain, anorexia, nausea, vomiting, diarrhea  or change in bowel or urinary habits, change in stools or urine, dysuria,hematuria,  rash, arthralgias, visual complaints, headache, numbness weakness or ataxia or problems with walking or coordination,  change in mood/affect or memory.               Objective:     GEN: obese,   elderly  AAM in NAD in w/c on 02  HEENT:  Oral pharyngeal pharynx clear with no lesions, no postnasal drip or exudate noted.   NECK:  Supple w/ fair ROM. No JVD. Normal carotid impulses w/o bruits; no thyromegaly or nodules palpated; no lymphadenopathy.  RESP:  distanat bs, trace end exp wheeze   CARD:  RRR.  No murmurs, rubs or gallops. 1-2+edema to BLE pulses intact. No cyanosis or clubbing.  GI:   Obese. soft & nontender. Bowel sounds present in all quadrants. No organomegaly or masses detected.  Musco: Warm bil, no deformities or joint swelling noted.    Skin: Warm and dry. No lesions or rashes  .  CXR 4/23 >nad    Assessment & Plan:

## 2012-08-08 NOTE — Telephone Encounter (Signed)
Duncan Dull from [facility] called wanting to get an Ok from Dr. [name] for 1 additional visit with him this week for in home excerise and engergy treatment therapy.  Called Dorinda Hill to get the ok. His number is [phone]

## 2012-08-09 ENCOUNTER — Telehealth: Payer: Self-pay | Admitting: Family Medicine

## 2012-08-09 NOTE — Assessment & Plan Note (Addendum)
-   Multifactorial GOLD III copd and ohs and complicated by prob cor cor pulmonale by last echo in 2013 -Chronic 02 dep RF - hco3 38 05/27/2011  So hypercarbic as well - 06/13/12 02 sat walking 5 liters pulsed was 86%. Placed pt on 3 liters continuous and sats were 92% at rest  Rx effective 06/14/11: 3lpm 24 h 24/7 and no pulsing 06/13/2012  With increase to 5lpm continuous at more than room to room walking

## 2012-08-09 NOTE — Telephone Encounter (Signed)
Go ahead and set this up 

## 2012-08-09 NOTE — Telephone Encounter (Signed)
Faxed request to [facility]

## 2012-08-09 NOTE — Assessment & Plan Note (Signed)
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 - hco3 38 05/27/2011  So hypercarbic as well  GOLD III with chronic hypercarbic resp failure but well compensated on bipap per Dr Shelle Iron and qid nebs with duoneb and performist  I had an extended discussion with the patient and daughter  today lasting 15 to 20 minutes of a 25 minute visit on the following issues:     Each maintenance medication was reviewed in detail including most importantly the difference between maintenance and as needed and under what circumstances the prns are to be used. This was done in the context of a medication calendar review which provided the patient with a user-friendly unambiguous mechanism for medication administration and reconciliation and provides an action plan for all active problems. It is critical that this be shown to every doctor  for modification during the office visit if necessary so the patient can use it as a working document.      However, he continues to struggle with multiple providers changing his meds but not revising the calendar he uses.  Since the pulmonary part of this regimen is just the nebs I have offered to help him with pulmonary issues but only if he first re-does the med rec process to verify he's taking what we think he's taking from all the other docs involved.

## 2012-08-10 NOTE — Telephone Encounter (Signed)
FYI

## 2012-08-14 ENCOUNTER — Telehealth: Payer: Self-pay | Admitting: Family Medicine

## 2012-08-14 MED ORDER — GLUCOSE BLOOD VI STRP: ORAL_STRIP | Status: DC

## 2012-08-14 NOTE — Telephone Encounter (Signed)
SENT TEST STRIPS IN

## 2012-08-15 ENCOUNTER — Ambulatory Visit: Payer: Medicare Other | Admitting: Pulmonary Disease

## 2012-08-15 ENCOUNTER — Encounter: Payer: Self-pay | Admitting: Family Medicine

## 2012-08-15 ENCOUNTER — Ambulatory Visit (INDEPENDENT_AMBULATORY_CARE_PROVIDER_SITE_OTHER): Payer: Medicare Other | Admitting: Family Medicine

## 2012-08-15 VITALS — BP 130/60 | HR 78 | Wt 255.0 lb

## 2012-08-15 DIAGNOSIS — E669 Obesity, unspecified: Secondary | ICD-10-CM

## 2012-08-15 DIAGNOSIS — E119 Type 2 diabetes mellitus without complications: Secondary | ICD-10-CM

## 2012-08-15 DIAGNOSIS — J449 Chronic obstructive pulmonary disease, unspecified: Secondary | ICD-10-CM

## 2012-08-15 DIAGNOSIS — J4489 Other specified chronic obstructive pulmonary disease: Secondary | ICD-10-CM

## 2012-08-15 DIAGNOSIS — I10 Essential (primary) hypertension: Secondary | ICD-10-CM

## 2012-08-15 NOTE — Patient Instructions (Addendum)
Keep your feet elevated when you sit down.  Go from lying to sitting to standing to help with the dizziness. Continue on Lantus and Apidra Give Dr. Thurston Hole office a call concerning your inhalers to make sure you're doing them correctly

## 2012-08-15 NOTE — Progress Notes (Signed)
  Subjective:    Patient ID: [name] [name], male    DOB: [birth year], 72 y.o.   MRN: [id number]  HPI He is here for a followup visit. He is now living at home again. Nurses from [facility] visit him 3 times per week. He also has physical therapy helping him.he is cooking his own meals. He does check his blood sugars periodically and states that they run between 102 125. He was placed on NovoLog in the hospital however this ran out and he switched himself back to Apidra. This occurred several days ago he states that his blood sugars are much better now. He continues on Lantus 10 mg twice a day. He states that he has not had a cigarette since April 25 and that he is breathing much better. He states that he can walk for a longer period of time. He does complain of some swelling in his feet especially towards the end of the day. He does do a lot of sitting during the day. He also states that occasionally when he goes from lying to standing quickly he will become slightly dizzy.He did not bring in any of his medications or the medication sheet that was filled out when he visited Dr. Sherene Sires. The note from Dr. Sherene Sires was reviewed extensively by me.   Review of Systems     Objective:   Physical Exam Alert and in no distress. He is not tachypneic.blood pressure is recorded.       Assessment & Plan:  DIABETES MELLITUS, TYPE II  HYPERTENSION  COPD  OBESITY I went over the information concerning his medications with him again. Strongly encouraged him to bring in all medications and the medication reconciliation sheet every time he visits the doctor. Informed him to Dr. Work will be following his inhalers. I will continue him on Lantus and Apidra. Complemented him on his quitting smoking. Over 20 minutes spent discussing all these issues with him.

## 2012-08-17 ENCOUNTER — Telehealth: Payer: Self-pay | Admitting: Internal Medicine

## 2012-08-17 NOTE — Telephone Encounter (Signed)
[name] [name] from [facility] has called stating that an hour ago his blood sugar was 303 and he did 15 units of lantus and 15 of novolog and then later on check his sugars again and it was 427. What does he need to do and waht kind of sliding scales does he need to be on. Please call pt to tell cause [name] [name] was about to leave his house

## 2012-08-18 ENCOUNTER — Other Ambulatory Visit: Payer: Self-pay

## 2012-08-18 NOTE — Telephone Encounter (Signed)
CALLED AND LEFT MESSAGE

## 2012-08-18 NOTE — Telephone Encounter (Signed)
PT BACK ON NOVOLOG AND HIS B/S WAS 178 THIS MORNING

## 2012-08-18 NOTE — Telephone Encounter (Signed)
PT IS BACK NOVALOG AND B/S THIS MORNING WAS 178

## 2012-08-18 NOTE — Telephone Encounter (Signed)
Call him this morning and see what his blood sugars are

## 2012-09-04 ENCOUNTER — Encounter: Payer: Self-pay | Admitting: Family Medicine

## 2012-09-08 ENCOUNTER — Ambulatory Visit: Payer: Medicare Other | Admitting: Pulmonary Disease

## 2012-09-11 ENCOUNTER — Other Ambulatory Visit: Payer: Self-pay | Admitting: Adult Health

## 2012-09-11 ENCOUNTER — Other Ambulatory Visit: Payer: Self-pay | Admitting: Internal Medicine

## 2012-09-11 ENCOUNTER — Other Ambulatory Visit: Payer: Self-pay | Admitting: Family Medicine

## 2012-09-19 ENCOUNTER — Ambulatory Visit: Payer: Medicare Other | Admitting: Internal Medicine

## 2012-09-27 ENCOUNTER — Other Ambulatory Visit: Payer: Self-pay | Admitting: Family Medicine

## 2012-10-02 ENCOUNTER — Telehealth: Payer: Self-pay | Admitting: Endocrinology

## 2012-10-02 NOTE — Telephone Encounter (Signed)
Called pt as requested by Dr. Lucianne Muss to sch follow up appt. Pt declines follow up at this time, says he has made other arrangements for care / [name] S.

## 2012-10-02 NOTE — Telephone Encounter (Signed)
FYI

## 2012-10-04 ENCOUNTER — Encounter: Payer: Self-pay | Admitting: Family Medicine

## 2012-10-04 ENCOUNTER — Encounter: Payer: Medicare Other | Admitting: Family Medicine

## 2012-10-04 NOTE — Progress Notes (Signed)
Error   This encounter was created in error - please disregard. 

## 2012-10-07 ENCOUNTER — Encounter: Payer: Self-pay | Admitting: Internal Medicine

## 2012-10-13 ENCOUNTER — Ambulatory Visit: Payer: Medicare Other | Admitting: Pulmonary Disease

## 2012-11-07 ENCOUNTER — Encounter: Payer: Self-pay | Admitting: Pulmonary Disease

## 2012-11-07 ENCOUNTER — Ambulatory Visit (INDEPENDENT_AMBULATORY_CARE_PROVIDER_SITE_OTHER): Payer: Medicare Other | Admitting: Pulmonary Disease

## 2012-11-07 VITALS — BP 140/78 | HR 75 | Temp 97.8°F | Wt 266.4 lb

## 2012-11-07 DIAGNOSIS — G4733 Obstructive sleep apnea (adult) (pediatric): Secondary | ICD-10-CM

## 2012-11-07 NOTE — Assessment & Plan Note (Signed)
The patient has been wearing bilevel intermittently in the past, but most recently has been wearing more consistently.  He denies any issues with his mask or pressure.  It is difficult to say whether the episode he is describing is related to a confusional arousal or hypnagogic hallucination.  He also has a history of a seizure disorder, and I would consider whether this could represent nocturnal seizures.  Finally, I always worry that bilevel in COPD patients can cause air trapping, leading to increased hypercarbia and confusion.  However, this is only happened one time in a year, and only 3 times over many years.  At this point, I would not make a change to his BiPAP setup, and he is to let us know if this becomes a more frequent occurrence.

## 2012-11-07 NOTE — Patient Instructions (Addendum)
Continue on your bipap, and keep up with mask changes and supplies. Work on [employer] loss Let me know if you are having more frequent "episodes" Cancel your upcoming apptm, and reschedule for one year.

## 2012-11-07 NOTE — Progress Notes (Signed)
  Subjective:    Patient ID: [name] [name], male    DOB: 17-Jun-1940, 72 y.o.   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  He is wearing BiPAP compliantly most recently, he denies any issues with his mask fit or pressure.  He has had one episode recently of where he was in an awakened/dream state, and he is unclear if he was awake or not during this time.  This has happened only 3 times in the last 2 years, with the last being one year ago.  It has not recurred since that time.  Overall, he feels that he is sleeping well with his device, and that it does help his daytime alertness.   Review of Systems  Constitutional: Negative for fever and unexpected weight change.  HENT: Negative for ear pain, nosebleeds, congestion, sore throat, rhinorrhea, sneezing, trouble swallowing, dental problem, postnasal drip and sinus pressure.   Eyes: Negative for redness and itching.  Respiratory: Negative for cough, chest tightness, shortness of breath and wheezing.   Cardiovascular: Negative for palpitations and leg swelling.  Gastrointestinal: Negative for nausea and vomiting.  Genitourinary: Negative for dysuria.  Musculoskeletal: Negative for joint swelling.  Skin: Negative for rash.  Neurological: Negative for headaches.  Hematological: Does not bruise/bleed easily.  Psychiatric/Behavioral: Positive for hallucinations and confusion. Negative for dysphoric mood. The patient is not nervous/anxious.        Objective:   Physical Exam Obese male in no acute distress Nose without purulence or discharge noted No skin breakdown or pressure necrosis with CPAP mask Neck large, no obvious lymphadenopathy or thyromegaly Lower extremities with edema noted, no cyanosis Alert and oriented, does not appear to be sleepy, moves all 4 extremities.       Assessment & Plan:

## 2012-11-16 ENCOUNTER — Other Ambulatory Visit: Payer: Self-pay | Admitting: Internal Medicine

## 2012-11-17 ENCOUNTER — Telehealth: Payer: Self-pay | Admitting: Pulmonary Disease

## 2012-11-17 DIAGNOSIS — J449 Chronic obstructive pulmonary disease, unspecified: Secondary | ICD-10-CM

## 2012-11-17 DIAGNOSIS — J9610 Chronic respiratory failure, unspecified whether with hypoxia or hypercapnia: Secondary | ICD-10-CM

## 2012-11-17 MED ORDER — ALBUTEROL SULFATE (2.5 MG/3ML) 0.083% IN NEBU: 5.0000 mg | INHALATION_SOLUTION | Freq: Four times a day (QID) | RESPIRATORY_TRACT | Status: DC

## 2012-11-17 MED ORDER — IPRATROPIUM BROMIDE 0.02 % IN SOLN: 0.5000 mg | Freq: Four times a day (QID) | RESPIRATORY_TRACT | Status: DC

## 2012-11-17 NOTE — Telephone Encounter (Signed)
Spoke with patients sister Patient needing refill on albuterol and ipratropium nebs Refill sent to verified pharmacy and nothing further needed at this time

## 2012-12-12 ENCOUNTER — Ambulatory Visit: Payer: Medicare Other | Admitting: Family Medicine

## 2012-12-27 ENCOUNTER — Ambulatory Visit (INDEPENDENT_AMBULATORY_CARE_PROVIDER_SITE_OTHER): Payer: Medicare Other | Admitting: Internal Medicine

## 2012-12-27 ENCOUNTER — Telehealth: Payer: Self-pay | Admitting: Internal Medicine

## 2012-12-27 ENCOUNTER — Encounter: Payer: Self-pay | Admitting: Internal Medicine

## 2012-12-27 VITALS — BP 130/64 | HR 86 | Temp 98.5°F | Ht 67.5 in | Wt 264.0 lb

## 2012-12-27 DIAGNOSIS — G4733 Obstructive sleep apnea (adult) (pediatric): Secondary | ICD-10-CM

## 2012-12-27 DIAGNOSIS — I2781 Cor pulmonale (chronic): Secondary | ICD-10-CM

## 2012-12-27 DIAGNOSIS — J449 Chronic obstructive pulmonary disease, unspecified: Secondary | ICD-10-CM

## 2012-12-27 DIAGNOSIS — J9610 Chronic respiratory failure, unspecified whether with hypoxia or hypercapnia: Secondary | ICD-10-CM

## 2012-12-27 DIAGNOSIS — I279 Pulmonary heart disease, unspecified: Secondary | ICD-10-CM

## 2012-12-27 MED ORDER — PANTOPRAZOLE SODIUM 40 MG PO TBEC: DELAYED_RELEASE_TABLET | ORAL | Status: DC

## 2012-12-27 MED ORDER — BUDESONIDE 0.25 MG/2ML IN SUSP: 0.2500 mg | Freq: Four times a day (QID) | RESPIRATORY_TRACT | Status: DC

## 2012-12-27 NOTE — Progress Notes (Signed)
Subjective:    Patient ID: [name] [name], male    DOB: [birth year], 72 y.o.   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  He is wearing BiPAP compliantly most recently, he denies any issues with his mask fit or pressure.  He has had one episode recently of where he was in an awakened/dream state, and he is unclear if he was awake or not during this time.  This has happened only 3 times in the last 2 years, with the last being one year ago.  It has not recurred since that time.  Overall, he feels that he is sleeping well with his device, and that it does help his daytime alertness.   Review of Systems  Constitutional: Negative for fever and unexpected weight change.  HENT: Negative for ear pain, nosebleeds, congestion, sore throat, rhinorrhea, sneezing, trouble swallowing, dental problem, postnasal drip and sinus pressure.   Eyes: Negative for redness and itching.  Respiratory: Negative for cough, chest tightness, shortness of breath and wheezing.   Cardiovascular: Negative for palpitations and leg swelling.  Gastrointestinal: Negative for nausea and vomiting.  Genitourinary: Negative for dysuria.  Musculoskeletal: Negative for joint swelling.  Skin: Negative for rash.  Neurological: Negative for headaches.  Hematological: Does not bruise/bleed easily.  Psychiatric/Behavioral: Positive for hallucinations and confusion. Negative for dysphoric mood. The patient is not nervous/anxious.        Objective:   Physical Exam Obese male in no acute distress Nose without purulence or discharge noted No skin breakdown or pressure necrosis with CPAP mask Neck large, no obvious lymphadenopathy or thyromegaly Lower extremities with edema noted, no cyanosis Alert and oriented, does not appear to be sleepy, moves all 4 extremities.       Assessment & Plan:  * Subjective:    Patient ID: [name] [name], male    DOB: March 28, 1940    MRN: [id number]  HPI 6 yobm with morbid obesity actively smoking followed in pulmonary clinic for copd/ chronic resp failure - PFTs 10/13/07 FEV1 of 45%, ratio 63%, FVC 15% improvement after bronchodilators DLCO 45%    05/04/2011 Post Hospital follow up  Admitted 04/16/2011 and discharged 04/28/2011 for acute on chronic hypercarbic/hypoxic respiratory failure, PNA -RLL, volume overload/pulmonary edema w/ decompensated on cor pulmonale ,AECOPD vs acei effect w/ ongoing smoking. Tx with IV abx, steroids, diuresis, BIPAP. Discharged on Levaquin and steroid taper.   Cc  breathing has improved since discharge but still has some shortness of breath with exertion along with weakness and fatigue. Minimum cough. Clear mucus production with occasional dark red tinged sputum. BLE edema, greater on right than left. Uses BiPAP at night and states that he's sleeping much better now than he has in years. Has completed discharge antibiotics. Uses nebulizer's three-  four times daily and is completing current steroid taper.   On 5L continuous O2 and reports smoking 4-6 cigarettes since discharge. Hx  5 ppd smoking at his max.   Admits to smoking inside home and with removal of oxygen in a separate room. Will occasionally leave oxygen system on.  CXR >  improved aeration with some residual opacity in RLL  rec Finish Prednisone taper as directed Low salt diet.  Continue Xopenex and Atrovent Neb Four times a day   Very important to stop smoking.  No smoking in house - with oxygen -it is very dangerous-flammable.  Legs elevated.  Follow up Dr. Sherene Sires  In 3 weeks Dr. Sherene Sires   follow up Dr. Shelle Iron as planned for sleep consult.   05/27/2011 f/u ov/Irving Lubbers cc "cpap working better" (record says it's bipap and he has new machine) wearing 02  at hs and practically all day on 02 at 5lpm breathing better than he has in a year now off acei and actually ran out of albuterol > 1 week prior to OV  s effect.   Thoroughly confused with names of meds/ machines/ 02 rx.  No purulent sputum. Leg swelling better >>labs-improved renal fxn off ace  and xray -w / residual opacity at right base.   09/10/2011 Follow up and Med review  Returns for a three-month followup and medication review. We reviewed all his medications and organized them into a medication calendar. Says that he is at his baseline. Has daily cough and congestion mainly clear to white mucus. Continues to smoke with no plans on quitting. Smoking cessation discussed. Is having difficulty affording his ipratropium nebulizer through the pharmacy. We discussed getting his nebulizer through the DME company rec Add Budesonide Neb 0.25mg  Twice daily   Continue on Ipratropium Neb Four times a day  (sent rx to DME company -[employer] )  May get saline nasal spray for nasal stuffiness May get mucinex DM Twice daily  As needed  Cough/congestion  Follow med calendar closely and bring to each visit.  follow up Dr. Sherene Sires  In  3 months and As needed   I will call with xray results. > Chronic lung changes consistent with COPD.  Minimal atelectasis or scarring at bases.   12/16/2011 f/u ov/Kalief Kattner cc missed appt  01/25/2012 f/u ov/Aaditya Letizia cc missed appt  05/30/12 [name] for osa/ohs rec Will get your equipment company to get you new mask, supplies, and check your bipap machine.  Your machine may need to be replaced. Work on [employer] loss  06/13/2012 f/u ov/Myleka Moncure f/u   Copd/ chronic resp failure Chief Complaint  Patient presents with  . Follow-up    pt c/o increased SOB, productive cough with tan phlegm since last OV.   wearing 3lpm not using bipap since last ov with Clance and not saturating well  on 3lpm pulsed on arrival, overal worse since last ov, thoroughly confused with meds, nebs.    No obvious daytime variabilty or assoc  or cp or chest tightness, subjective wheeze overt sinus or hb symptoms. No unusual exp hx or h/o childhood pna/ asthma or premature  birth to his knowledge.  >>O2 increased to 3l/m at rest and 5 l/m walking    06/29/2012 Post Hospital follow up  Admitted 4/21-4/25 for acute encephalopathy due to hypercarbic resp failure from BIPAP and med non compliance.  Required brief intubation/vent support .  Tx w/ empiric abx, steroid for COPD flare .  Discharged on Levaquin every other day .  Finishes  Levaquin in 3 days .  Discharged to [facility]. Not wearing BIPAP since discharge.  We discussed in detail with pt and family member importance of BIPAP. He says he did not know that wearing BIPAP was that important.  Says he doing better but still weak.  rec Make sure you wear you BIPAP with naps and At bedtime   Continue with O2 at 3lm continuous flow .  Follow up with Clance in 4 weeks  Follow up  With Dr. Sherene Sires  In 6 weeks  Continue on Budesonide Neb Twice daily   Continue on [name] .Four times a day   08/08/2012  f/u ov/Bill Mcvey re chronic resp failure / sleeping on bipap Chief Complaint  Patient presents with  . Follow-up    Pt states that his breathing has improved since the last visit. No new co's today.   still doing poorly with noct breathing despite bibap, swelling is better Mucus is clear. rec F/u for medication reconciliation> did not do   12/27/2012 f/u ov/Aloha Bartok quit smoking 2013  re: chronic resp failure Chief Complaint  Patient presents with  . Acute Visit    Pt c/o increased SOB for the past 2-3 wks. He states that this started when he was exposed to smoke coming from the stove. He gets SOB with any exertion at all. Sats were 70% 5lpm pulsed and increased to 91%5lpm pulsed with pursed lip breathing. Pt admits that he holds his breath alot or forgets to breathe.    Wears bipap and does fine during the night then feels llightheaded p stop bipap in am and place 02 on 3lpm but in terms of breathing  feels ok sitting still,  Doe x across the room uses w/c to go out and basically just to see the doctor - no better  with saba but really confused with meds/ names/ how to use   No obvious daytime variabilty or assoc cough   cp or chest tightness, subjective wheeze overt sinus or hb symptoms. No unusual exp hx or h/o childhood pna/ asthma or premature birth to his knowledge.   .Also denies any obvious fluctuation of symptoms with weather or environmental changes or other aggravating or alleviating factors except as outlined above       Current Medications, Allergies, Past Medical History, Past Surgical History, Family History, and Social History were reviewed in [employer] record.  ROS  The following are not active complaints unless bolded sore throat, dysphagia, dental problems, itching, sneezing,  nasal congestion or excess/ purulent secretions, ear ache,   fever, chills, sweats, unintended wt loss, pleuritic or exertional cp, hemoptysis,  orthopnea pnd or leg swelling, presyncope, palpitations, heartburn, abdominal pain, anorexia, nausea, vomiting, diarrhea  or change in bowel or urinary habits, change in stools or urine, dysuria,hematuria,  rash, arthralgias, visual complaints, headache, numbness weakness or ataxia or problems with walking or coordination,  change in mood/affect or memory.               Objective:     GEN: obese,   elderly  AAM in NAD in w/c on 02  HEENT:  Oral pharyngeal pharynx clear with no lesions, no postnasal drip or exudate noted.   NECK:  Supple w/ fair ROM. No JVD. Normal carotid impulses w/o bruits; no thyromegaly or nodules palpated; no lymphadenopathy.  RESP:  distant bs, trace end exp wheeze   CARD:  RRR. No murmurs, rubs or gallops. 1-2+edema to BLE pulses intact. No cyanosis or clubbing.  GI:   Obese. soft & nontender. Bowel sounds present in all quadrants. No organomegaly or masses detected.  Musco: Warm bil, no deformities or joint swelling noted.    Skin: Warm and dry. No lesions or rashes  .  CXR 06/21/12 >nad    Assessment &  Plan:

## 2012-12-27 NOTE — Patient Instructions (Addendum)
Ok to adjust 02 up to keep your oxygen level above 85% but otherwise just use 3lpm 24/7 except 5lpm pulsed when using portable system  Be sure to take Pantoprazole   30-60 min before first meal of the day   No change in medications for now but call us with any discrepancies between your medications and this sheet.  See Tammy NP 4  weeks with all your medications, even over the counter meds, separated in two separate bags, the ones you take no matter what vs the ones you stop once you feel better and take only as needed when you feel you need them.   Tammy  will generate for you a new user friendly medication calendar that will put [place] all on the same page re: your medication use.     Without this process, it simply isn't possible to assure that we are providing  your outpatient care  with  the attention to detail we feel you deserve.   If we cannot assure that you're getting that kind of care,  then we cannot manage your problem effectively from this clinic.  Once you have seen Tammy and we are sure that we're all on the same page with your medication use she will arrange follow up with me.

## 2012-12-27 NOTE — Telephone Encounter (Signed)
Spoke with the pt sister and she states the pt has been having increased SOB with minimal activity, increased productive cough with phlegm. SHe is unsure if any color. Pt is requesting an appt this PM. I spoke with MW and he gave ok to add pt on. Appt set for 3pm. Carron Curie, CMA

## 2012-12-28 NOTE — Assessment & Plan Note (Signed)
Appears to be sleeping well on bipap/02 3lpm  > fu per Dr Shelle Iron planned

## 2012-12-28 NOTE — Assessment & Plan Note (Addendum)
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

## 2012-12-28 NOTE — Assessment & Plan Note (Signed)
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   To keep things simple, I have asked the patient to first separate medicines that are perceived as maintenance, that is to be taken daily "no matter what", from those medicines that are taken on only on an as-needed basis and I have given the patient examples of both, and then return to see our NP to generate a  detailed  medication calendar which should be followed until the next physician sees the patient and updates it.

## 2012-12-28 NOTE — Assessment & Plan Note (Signed)
Echo 04/21/11 Left ventricle: The cavity size was normal. Wall thickness was normal. Systolic function was normal. The estimated ejection fraction was in the range of 55% to 60%. - Right ventricle: Not well seen but appears dilated  Would do better if more c/w 02 use All diuretic rx per Dr Darrick Penna

## 2012-12-31 ENCOUNTER — Encounter (HOSPITAL_COMMUNITY): Payer: Self-pay | Admitting: Emergency Medicine

## 2012-12-31 ENCOUNTER — Inpatient Hospital Stay (HOSPITAL_COMMUNITY)
Admission: EM | Admit: 2012-12-31 | Discharge: 2013-01-04 | DRG: 291 | Disposition: A | Discharge status: Home Health | Payer: Medicare Other | Attending: Pulmonary Disease | Admitting: Pulmonary Disease

## 2012-12-31 ENCOUNTER — Emergency Department (HOSPITAL_COMMUNITY): Payer: Medicare Other

## 2012-12-31 DIAGNOSIS — T380X5A Adverse effect of glucocorticoids and synthetic analogues, initial encounter: Secondary | ICD-10-CM | POA: Diagnosis not present

## 2012-12-31 DIAGNOSIS — Z951 Presence of aortocoronary bypass graft: Secondary | ICD-10-CM

## 2012-12-31 DIAGNOSIS — M7989 Other specified soft tissue disorders: Secondary | ICD-10-CM | POA: Diagnosis present

## 2012-12-31 DIAGNOSIS — Z6841 Body Mass Index (BMI) 40.0 and over, adult: Secondary | ICD-10-CM

## 2012-12-31 DIAGNOSIS — J3489 Other specified disorders of nose and nasal sinuses: Secondary | ICD-10-CM | POA: Diagnosis present

## 2012-12-31 DIAGNOSIS — Z860100 Personal history of colon polyps, unspecified: Secondary | ICD-10-CM

## 2012-12-31 DIAGNOSIS — J309 Allergic rhinitis, unspecified: Secondary | ICD-10-CM

## 2012-12-31 DIAGNOSIS — J9620 Acute and chronic respiratory failure, unspecified whether with hypoxia or hypercapnia: Secondary | ICD-10-CM | POA: Diagnosis present

## 2012-12-31 DIAGNOSIS — Z9981 Dependence on supplemental oxygen: Secondary | ICD-10-CM

## 2012-12-31 DIAGNOSIS — J9610 Chronic respiratory failure, unspecified whether with hypoxia or hypercapnia: Secondary | ICD-10-CM

## 2012-12-31 DIAGNOSIS — Z9119 Patient's noncompliance with other medical treatment and regimen: Secondary | ICD-10-CM

## 2012-12-31 DIAGNOSIS — E119 Type 2 diabetes mellitus without complications: Secondary | ICD-10-CM

## 2012-12-31 DIAGNOSIS — R609 Edema, unspecified: Secondary | ICD-10-CM | POA: Diagnosis present

## 2012-12-31 DIAGNOSIS — Z794 Long term (current) use of insulin: Secondary | ICD-10-CM

## 2012-12-31 DIAGNOSIS — N179 Acute kidney failure, unspecified: Secondary | ICD-10-CM | POA: Diagnosis present

## 2012-12-31 DIAGNOSIS — J441 Chronic obstructive pulmonary disease with (acute) exacerbation: Secondary | ICD-10-CM

## 2012-12-31 DIAGNOSIS — N189 Chronic kidney disease, unspecified: Secondary | ICD-10-CM

## 2012-12-31 DIAGNOSIS — Z8249 Family history of ischemic heart disease and other diseases of the circulatory system: Secondary | ICD-10-CM

## 2012-12-31 DIAGNOSIS — R062 Wheezing: Secondary | ICD-10-CM | POA: Diagnosis present

## 2012-12-31 DIAGNOSIS — R42 Dizziness and giddiness: Secondary | ICD-10-CM | POA: Diagnosis present

## 2012-12-31 DIAGNOSIS — D696 Thrombocytopenia, unspecified: Secondary | ICD-10-CM

## 2012-12-31 DIAGNOSIS — G4733 Obstructive sleep apnea (adult) (pediatric): Secondary | ICD-10-CM

## 2012-12-31 DIAGNOSIS — I5033 Acute on chronic diastolic (congestive) heart failure: Principal | ICD-10-CM | POA: Diagnosis present

## 2012-12-31 DIAGNOSIS — I2781 Cor pulmonale (chronic): Secondary | ICD-10-CM

## 2012-12-31 DIAGNOSIS — J9692 Respiratory failure, unspecified with hypercapnia: Secondary | ICD-10-CM

## 2012-12-31 DIAGNOSIS — F17200 Nicotine dependence, unspecified, uncomplicated: Secondary | ICD-10-CM

## 2012-12-31 DIAGNOSIS — I10 Essential (primary) hypertension: Secondary | ICD-10-CM

## 2012-12-31 DIAGNOSIS — F329 Major depressive disorder, single episode, unspecified: Secondary | ICD-10-CM | POA: Diagnosis present

## 2012-12-31 DIAGNOSIS — F3289 Other specified depressive episodes: Secondary | ICD-10-CM

## 2012-12-31 DIAGNOSIS — I279 Pulmonary heart disease, unspecified: Secondary | ICD-10-CM | POA: Diagnosis present

## 2012-12-31 DIAGNOSIS — D649 Anemia, unspecified: Secondary | ICD-10-CM

## 2012-12-31 DIAGNOSIS — R042 Hemoptysis: Secondary | ICD-10-CM | POA: Diagnosis present

## 2012-12-31 DIAGNOSIS — E785 Hyperlipidemia, unspecified: Secondary | ICD-10-CM

## 2012-12-31 DIAGNOSIS — N184 Chronic kidney disease, stage 4 (severe): Secondary | ICD-10-CM | POA: Diagnosis present

## 2012-12-31 DIAGNOSIS — I509 Heart failure, unspecified: Secondary | ICD-10-CM

## 2012-12-31 DIAGNOSIS — Z91199 Patient's noncompliance with other medical treatment and regimen due to unspecified reason: Secondary | ICD-10-CM

## 2012-12-31 DIAGNOSIS — N183 Chronic kidney disease, stage 3 unspecified: Secondary | ICD-10-CM | POA: Diagnosis present

## 2012-12-31 DIAGNOSIS — I129 Hypertensive chronic kidney disease with stage 1 through stage 4 chronic kidney disease, or unspecified chronic kidney disease: Secondary | ICD-10-CM | POA: Diagnosis present

## 2012-12-31 DIAGNOSIS — J449 Chronic obstructive pulmonary disease, unspecified: Secondary | ICD-10-CM

## 2012-12-31 DIAGNOSIS — E1129 Type 2 diabetes mellitus with other diabetic kidney complication: Secondary | ICD-10-CM | POA: Diagnosis present

## 2012-12-31 DIAGNOSIS — E662 Morbid (severe) obesity with alveolar hypoventilation: Secondary | ICD-10-CM | POA: Diagnosis present

## 2012-12-31 LAB — BLOOD GAS, ARTERIAL
Acid-Base Excess: 14.2 mmol/L — ABNORMAL HIGH (ref 0.0–2.0)
Patient temperature: 98.6
TCO2: 43 mmol/L (ref 0–100)
pCO2 arterial: 110 mmHg (ref 35.0–45.0)
pH, Arterial: 7.244 — ABNORMAL LOW (ref 7.350–7.450)
pO2, Arterial: 56.6 mmHg — ABNORMAL LOW (ref 80.0–100.0)

## 2012-12-31 LAB — COMPREHENSIVE METABOLIC PANEL
ALT: 15 U/L (ref 0–53)
Albumin: 3.9 g/dL (ref 3.5–5.2)
Alkaline Phosphatase: 69 U/L (ref 39–117)
Calcium: 10.6 mg/dL — ABNORMAL HIGH (ref 8.4–10.5)
Chloride: 94 mEq/L — ABNORMAL LOW (ref 96–112)
GFR calc Af Amer: 27 mL/min — ABNORMAL LOW (ref >90)
Glucose, Bld: 209 mg/dL — ABNORMAL HIGH (ref 70–99)
Potassium: 4 mEq/L (ref 3.5–5.1)
Sodium: 142 mEq/L (ref 135–145)
Total Protein: 8.3 g/dL (ref 6.0–8.3)

## 2012-12-31 LAB — CBC
Hemoglobin: 12.9 g/dL — ABNORMAL LOW (ref 13.0–17.0)
MCH: 29.1 pg (ref 26.0–34.0)
MCHC: 29.7 g/dL — ABNORMAL LOW (ref 30.0–36.0)
MCV: 98.2 fL (ref 78.0–100.0)
RDW: 13.7 % (ref 11.5–15.5)

## 2012-12-31 LAB — PRO B NATRIURETIC PEPTIDE: Pro B Natriuretic peptide (BNP): 952.7 pg/mL — ABNORMAL HIGH (ref 0–125)

## 2012-12-31 MED ORDER — ACETAMINOPHEN 325 MG PO TABS: 650.0000 mg | ORAL_TABLET | Freq: Four times a day (QID) | ORAL | Status: DC | PRN

## 2012-12-31 MED ORDER — HEPARIN SODIUM (PORCINE) 5000 UNIT/ML IJ SOLN
5000.0000 [IU] | Freq: Three times a day (TID) | INTRAMUSCULAR | Status: DC
  Administered 2012-12-31 – 2013-01-04 (×12): 5000 [IU] via SUBCUTANEOUS
  Filled 2012-12-31 (×14): qty 1

## 2012-12-31 MED ORDER — INSULIN ASPART 100 UNIT/ML ~~LOC~~ SOLN
0.0000 [IU] | Freq: Three times a day (TID) | SUBCUTANEOUS | Status: DC
  Administered 2013-01-01: 4 [IU] via SUBCUTANEOUS
  Administered 2013-01-02: 7 [IU] via SUBCUTANEOUS
  Administered 2013-01-02: 4 [IU] via SUBCUTANEOUS
  Administered 2013-01-02: 7 [IU] via SUBCUTANEOUS
  Administered 2013-01-03: 15 [IU] via SUBCUTANEOUS
  Administered 2013-01-03: 3 [IU] via SUBCUTANEOUS
  Administered 2013-01-03: 11 [IU] via SUBCUTANEOUS
  Administered 2013-01-04: 4 [IU] via SUBCUTANEOUS
  Administered 2013-01-04: 7 [IU] via SUBCUTANEOUS

## 2012-12-31 MED ORDER — GUAIFENESIN ER 600 MG PO TB12
600.0000 mg | ORAL_TABLET | Freq: Two times a day (BID) | ORAL | Status: DC
  Administered 2013-01-01 – 2013-01-04 (×7): 600 mg via ORAL
  Filled 2012-12-31 (×9): qty 1

## 2012-12-31 MED ORDER — IPRATROPIUM BROMIDE 0.02 % IN SOLN
0.5000 mg | Freq: Four times a day (QID) | RESPIRATORY_TRACT | Status: DC
  Administered 2012-12-31 – 2013-01-01 (×3): 0.5 mg via RESPIRATORY_TRACT
  Filled 2012-12-31 (×2): qty 2.5

## 2012-12-31 MED ORDER — ALBUTEROL SULFATE (5 MG/ML) 0.5% IN NEBU
2.5000 mg | INHALATION_SOLUTION | Freq: Four times a day (QID) | RESPIRATORY_TRACT | Status: DC
  Administered 2012-12-31 – 2013-01-01 (×3): 2.5 mg via RESPIRATORY_TRACT
  Filled 2012-12-31 (×2): qty 0.5

## 2012-12-31 MED ORDER — ALBUTEROL SULFATE (5 MG/ML) 0.5% IN NEBU
2.5000 mg | INHALATION_SOLUTION | Freq: Once | RESPIRATORY_TRACT | Status: AC
  Administered 2012-12-31: 2.5 mg via RESPIRATORY_TRACT
  Filled 2012-12-31: qty 0.5

## 2012-12-31 MED ORDER — FUROSEMIDE 80 MG PO TABS
80.0000 mg | ORAL_TABLET | Freq: Two times a day (BID) | ORAL | Status: DC
  Administered 2013-01-01 – 2013-01-02 (×3): 80 mg via ORAL
  Filled 2012-12-31 (×5): qty 1

## 2012-12-31 MED ORDER — BISOPROLOL FUMARATE 5 MG PO TABS
5.0000 mg | ORAL_TABLET | Freq: Every day | ORAL | Status: DC
  Administered 2013-01-01 – 2013-01-04 (×4): 5 mg via ORAL
  Filled 2012-12-31 (×5): qty 1

## 2012-12-31 MED ORDER — CALCITRIOL 0.25 MCG PO CAPS
0.2500 ug | ORAL_CAPSULE | Freq: Every day | ORAL | Status: DC
  Administered 2013-01-01 – 2013-01-04 (×4): 0.25 ug via ORAL
  Filled 2012-12-31 (×5): qty 1

## 2012-12-31 MED ORDER — METHYLPREDNISOLONE SODIUM SUCC 125 MG IJ SOLR
125.0000 mg | INTRAMUSCULAR | Status: AC
  Administered 2012-12-31: 125 mg via INTRAVENOUS
  Filled 2012-12-31: qty 2

## 2012-12-31 MED ORDER — PANTOPRAZOLE SODIUM 40 MG PO TBEC
40.0000 mg | DELAYED_RELEASE_TABLET | Freq: Every day | ORAL | Status: DC
  Administered 2013-01-01 – 2013-01-04 (×4): 40 mg via ORAL
  Filled 2012-12-31 (×4): qty 1

## 2012-12-31 MED ORDER — ALBUTEROL SULFATE (5 MG/ML) 0.5% IN NEBU: 2.5000 mg | INHALATION_SOLUTION | RESPIRATORY_TRACT | Status: DC | PRN

## 2012-12-31 MED ORDER — POLYETHYLENE GLYCOL 3350 17 G PO PACK
17.0000 g | PACK | Freq: Every day | ORAL | Status: DC | PRN
  Filled 2012-12-31: qty 1

## 2012-12-31 MED ORDER — ACETAMINOPHEN 650 MG RE SUPP: 650.0000 mg | Freq: Four times a day (QID) | RECTAL | Status: DC | PRN

## 2012-12-31 MED ORDER — INSULIN GLARGINE 100 UNIT/ML ~~LOC~~ SOLN
10.0000 [IU] | Freq: Two times a day (BID) | SUBCUTANEOUS | Status: DC
  Administered 2013-01-01 – 2013-01-02 (×4): 10 [IU] via SUBCUTANEOUS
  Filled 2012-12-31 (×6): qty 0.1

## 2012-12-31 MED ORDER — IPRATROPIUM BROMIDE 0.02 % IN SOLN
0.5000 mg | Freq: Once | RESPIRATORY_TRACT | Status: AC
  Administered 2012-12-31: 0.5 mg via RESPIRATORY_TRACT
  Filled 2012-12-31: qty 2.5

## 2012-12-31 MED ORDER — BUDESONIDE 0.25 MG/2ML IN SUSP
0.2500 mg | Freq: Two times a day (BID) | RESPIRATORY_TRACT | Status: DC
  Administered 2012-12-31 – 2013-01-04 (×8): 0.25 mg via RESPIRATORY_TRACT
  Filled 2012-12-31 (×16): qty 2

## 2012-12-31 MED ORDER — SIMVASTATIN 20 MG PO TABS
20.0000 mg | ORAL_TABLET | Freq: Every day | ORAL | Status: DC
  Administered 2013-01-01 – 2013-01-03 (×3): 20 mg via ORAL
  Filled 2012-12-31 (×4): qty 1

## 2012-12-31 MED ORDER — FUROSEMIDE 40 MG PO TABS
40.0000 mg | ORAL_TABLET | Freq: Every day | ORAL | Status: DC
  Administered 2012-12-31: 40 mg via ORAL
  Filled 2012-12-31: qty 1

## 2012-12-31 NOTE — Progress Notes (Signed)
Report called to ICU stepdown.

## 2012-12-31 NOTE — ED Notes (Signed)
Bed: WA23 Expected date:  Expected time:  Means of arrival:  Comments: ems 

## 2012-12-31 NOTE — ED Notes (Signed)
RN Dot Lanes made aware of critical CO2.

## 2012-12-31 NOTE — ED Provider Notes (Signed)
CSN: [id number]     Arrival date & time 12/31/12  1640 History   First MD Initiated Contact with Patient 12/31/12 1649     Chief Complaint  Patient presents with  . Shortness of Breath   (Consider location/radiation/quality/duration/timing/severity/associated sxs/prior Treatment) HPI Comments: The patient is a 72 year old male with a past medical history of COPD, CHF, OSA, Chronic renal failure, presenting with worsening dyspnea for 5 days.  His family member reports going to see his pulmonologist, Dr. Sandrea Hughs, In clinic on 12/27/2012.  His physical therapist reported that his pulse ox was in the low 80s on 12/29/2012. Reports a cough, no fever or chills.  States chronic lower extremity edema is unchanged from baseline. Sleeps in a recliner and is on 3L of oxygen at home. He reports compliance with his Atrovent and albuterol but non compliant with Pulmicort due to financial reasons. Deneis chest discomfort, abdominal pain, nausea, vomiting, or diarrhea.    The history is provided by the patient and a relative.    Past Medical History  Diagnosis Date  . Renal insufficiency   . Diabetes mellitus   . GERD (gastroesophageal reflux disease)   . Gastroparesis   . Esophageal stricture   . Hyperlipemia   . Hypertension   . Pancreatitis   . Chronic respiratory disease   . Depression   . Seizure disorder   . Arthritis   . Diverticulosis   . Sleep apnea     CPAP Machine  . Obesity   . ED (erectile dysfunction)   . Sleep apnea   . COPD (chronic obstructive pulmonary disease)     Gold Stage  III/IV  . H/O polycythemia   . Chronic kidney disease (CKD), stage III (moderate)   . DDD (degenerative disc disease), lumbar   . History of esophageal stricture   . Erectile dysfunction   . Peptic ulcer disease   . History of colon polyps   . Gout   . Gastroparesis   . Acute encephalopathy 06/20/2012  . ADENOCARCINOMA, PROSTATE, HX OF 08/05/2009    Qualifier: Diagnosis of  By: Koleen Distance CMA  (AAMA), Hulan Saas    . CHF (congestive heart failure) 05/19/2012  . DIVERTICULOSIS, COLON 08/05/2009    Qualifier: Diagnosis of  By: Koleen Distance CMA (AAMA), Hulan Saas    . Obesity hypoventilation syndrome 04/28/2011  . OSA (obstructive sleep apnea) 09/22/2006    Auto 2013:  Optimal pressure 13/10   . SEIZURE DISORDER 09/22/2006    Qualifier: Diagnosis of  By: Nena Jordan   . THROMBOCYTOPENIA 04/17/2007    Chronic, Qualifier: Diagnosis of  By: Thereasa Solo     Past Surgical History  Procedure Laterality Date  . Prostate surgery    . Coronary artery bypass graft      x 4  . Bone marrow biopsy    . Cardiac catheterization  05/02/2008  . Back surgery      lumbar   Family History  Problem Relation Age of Onset  . Colon cancer Neg Hx   . Cancer Brother     unsure what kind  . Heart disease Mother   . Heart disease Father   . Emphysema Father    History  Substance Use Topics  . Smoking status: Former Smoker -- 1.00 packs/day for 60 years    Types: Cigarettes    Quit date: 05/12/2012  . Smokeless tobacco: Never Used  . Alcohol Use: No     Comment: former heavy drinker  Review of Systems  All other systems reviewed and are negative.    Allergies  Review of patient's allergies indicates no known allergies.  Home Medications   Current Outpatient Rx  Name  Route  Sig  Dispense  Refill  . ACCU-CHEK SOFTCLIX LANCETS lancets      Use as directed to check blood glucose         . acetaminophen (TYLENOL) 500 MG tablet   Oral   Take 500 mg by mouth every 6 (six) hours as needed for pain ("per bottle").         . albuterol (PROVENTIL) (2.5 MG/3ML) 0.083% nebulizer solution   Nebulization   Take 5 mg by nebulization 3 (three) times daily. Dx 496         . bisoprolol (ZEBETA) 5 MG tablet      TAKE 1 TABLET BY MOUTH TWICE DAILY   60 tablet   3   . budesonide (PULMICORT) 0.25 MG/2ML nebulizer solution   Nebulization   Take 2 mLs (0.25 mg total) by nebulization 4  (four) times daily.   60 mL   6   . calcitRIOL (ROCALTROL) 0.25 MCG capsule   Oral   Take 1 capsule (0.25 mcg total) by mouth daily.   30 capsule   4   . furosemide (LASIX) 40 MG tablet      Take 3 tablet - 120 mg daily in am an 2 tab 80 mg in evening         . glucose blood test strip   Other   1 each by Other route 3 (three) times daily. Use as directed to check blood glucose         . HUMULIN N KWIKPEN 100 UNIT/ML SUPN   Injection   Inject 12 Units as directed 2 (two) times daily.         Marland Kitchen ipratropium (ATROVENT) 0.02 % nebulizer solution   Nebulization   Take 2.5 mLs (0.5 mg total) by nebulization 4 (four) times daily. DX: 496   300 mL   5     496   . NOVOLOG FLEXPEN 100 UNIT/ML SOPN FlexPen   Subcutaneous   Inject 15 Units into the skin daily.         . pantoprazole (PROTONIX) 40 MG tablet      Take 30-60 min before first meal of the day   30 tablet   4   . paricalcitol (ZEMPLAR) 2 MCG capsule      TAKE ONE CAPSULE BY MOUTH EVERY OTHER DAY   30 capsule   4   . simvastatin (ZOCOR) 40 MG tablet      TAKE 1 TABLET BY MOUTH EVERY EVENING   30 tablet   3    BP 146/84  Pulse 79  Temp(Src) 98.7 F (37.1 C) (Oral)  Resp 22  SpO2 92% Physical Exam  Nursing note and vitals reviewed. Constitutional: He appears well-developed and well-nourished.  HENT:  Head: Normocephalic and atraumatic.  Neck: Neck supple.  Cardiovascular: Regular rhythm.  Tachycardia present.   2+ pitting edema to bilateral ankles, 1+ to distal shin  Pulmonary/Chest: Accessory muscle usage present. Tachypnea noted. He has decreased breath sounds. He has wheezes.  Patient able to speak in short sentences.  88-92% on 6L [place] on exam.  Abdominal: Soft. There is no tenderness. There is no rebound.  Musculoskeletal: He exhibits edema.  Neurological: He is alert.  Skin: Skin is warm and dry. No rash noted.  ED Course  Procedures (including critical care time) Labs  Review Labs Reviewed  CBC - Abnormal; Notable for the following:    WBC 13.3 (*)    Hemoglobin 12.9 (*)    MCHC 29.7 (*)    Platelets 56 (*)    All other components within normal limits  COMPREHENSIVE METABOLIC PANEL - Abnormal; Notable for the following:    Chloride 94 (*)    CO2 44 (*)    Glucose, Bld 209 (*)    BUN 47 (*)    Creatinine, Ser 2.51 (*)    Calcium 10.6 (*)    GFR calc non Af Amer 24 (*)    GFR calc Af Amer 27 (*)    All other components within normal limits  PRO B NATRIURETIC PEPTIDE - Abnormal; Notable for the following:    Pro B Natriuretic peptide (BNP) 952.7 (*)    All other components within normal limits  BLOOD GAS, ARTERIAL - Abnormal; Notable for the following:    pH, Arterial 7.244 (*)    pCO2 arterial 110.0 (*)    pO2, Arterial 56.6 (*)    Bicarbonate 46.1 (*)    Acid-Base Excess 14.2 (*)    All other components within normal limits  BLOOD GAS, ARTERIAL   Imaging Review Dg Chest Portable 1 View  12/31/2012   CLINICAL DATA:  Shortness of breath.  EXAM: PORTABLE CHEST - 1 VIEW  COMPARISON:  06/21/2012.  FINDINGS: 1705 hr. Lordotic positioning. The heart size and mediastinal contours are normal. The lungs are clear. There is no pleural effusion or pneumothorax. No acute osseous findings are identified. Telemetry leads overlie the chest.  IMPRESSION: No active cardiopulmonary process.   Electronically Signed   By: Roxy Horseman M.D.   On: 12/31/2012 17:19    EKG Interpretation     Ventricular Rate:  97 PR Interval:  150 QRS Duration: 83 QT Interval:  357 QTC Calculation: 453 R Axis:   74 Text Interpretation:  Sinus rhythm Baseline wander in lead(s) I V4 V5 compared to prior, pvc's no longer present.            MDM   1. COPD exacerbation    Patient with a past medical history of COPD on home O2 presents with worsening dyspnea for several days.  Patient is tachypena on exam, speaking in short sentences, and wheezing. Chest XR, CBC, CMP, EKG  ordered.  He is followed by Dr. Nyoka Cowden with [facility].  Discussed patient condition with Dr. Romeo Apple, will add a BNP ABG.  ABG- pH 7.244, pCO2-110, HCO3- 46.1.  Per respiratory patient is being put on BiPAP.   Dr. Romeo Apple to reassess the patient and consult for admission.  Clabe Seal, PA-C 01/02/13 [phone]

## 2012-12-31 NOTE — Progress Notes (Signed)
RT transported pt to ICU without incident.

## 2012-12-31 NOTE — ED Notes (Addendum)
PT from home. Pt has not taken any of his medications for over a year. Pt on 3L [place] at home. Pt states he has been sob since Friday. EMS gave 5mg  albuterol prior to arrival

## 2012-12-31 NOTE — H&P (Signed)
[facility] History and Physical  [name] [name] AVW:[id number] DOB: 1940/10/01    PCP:   Georgianne Fick, MD  Pulmonology: Dr. Sherene Sires  Chief Complaint: shortness of breath  HPI: [name] [name] is an 72 y.o. male with pmh of OSA , obesity hypoventilation syndrome and CHF presents with 2-3 weeks of increasing shortness of breath.  He is on Bipap during interview, sats drop to 70% without.  He reports good compliance with BiPAP.  He admits to not completing prescribed nebulizer treatments and not using pulmicort due to finances.  He endorses cough with thick white phlegm, no fevers, chills or sweats.  He saw his pulmonologist earlier this week at at that time he was also short of breath with sats dropping into the 70%.  He declined admission at that time.  He is here with his sister who helps with the history.   Rewiew of Systems:  Constitutional: Negative for malaise, fever and chills. No significant weight loss or weight gain Eyes: Negative for eye pain or vision change ENMT: Negative for ear pain, hoarseness, nasal congestion, sinus pressure and sore throat.  Cardiovascular: Negative for chest pain, palpitations, diaphoresis Respiratory: as above Gastrointestinal: Negative for nausea, vomiting, diarrhea, constipation, abdominal pain, blood in stool, Genitourinary: Negative for frequency, dysuria Musculoskeletal: no painful or swollen joints Skin: . Negative for pruritus, rash, abrasions Neuro: positive for confusion   Past Medical History  Diagnosis Date  . Renal insufficiency   . Diabetes mellitus   . GERD (gastroesophageal reflux disease)   . Gastroparesis   . Esophageal stricture   . Hyperlipemia   . Hypertension   . Pancreatitis   . Chronic respiratory disease   . Depression   . Seizure disorder   . Arthritis   . Diverticulosis   . Sleep apnea     CPAP Machine  . Obesity   . ED (erectile dysfunction)   . Sleep apnea   . COPD (chronic obstructive pulmonary  disease)     Gold Stage  III/IV  . H/O polycythemia   . Chronic kidney disease (CKD), stage III (moderate)   . DDD (degenerative disc disease), lumbar   . History of esophageal stricture   . Erectile dysfunction   . Peptic ulcer disease   . History of colon polyps   . Gout   . Gastroparesis   . Acute encephalopathy 06/20/2012  . 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 CHF (congestive heart failure) 05/19/2012  . DIVERTICULOSIS, COLON 08/05/2009    Qualifier: Diagnosis of  By: Koleen Distance CMA (AAMA), Hulan Saas    . Obesity hypoventilation syndrome 04/28/2011  . OSA (obstructive sleep apnea) 09/22/2006    Auto 2013:  Optimal pressure 13/10   . SEIZURE DISORDER 09/22/2006    Qualifier: Diagnosis of  By: Nena Jordan   . THROMBOCYTOPENIA 04/17/2007    Chronic, Qualifier: Diagnosis of  By: Thereasa Solo      Past Surgical History  Procedure Laterality Date  . Prostate surgery    . Coronary artery bypass graft      x 4  . Bone marrow biopsy    . Cardiac catheterization  05/02/2008  . Back surgery      lumbar    Medications:  HOME MEDS: Prior to Admission medications   Medication Sig Start Date End Date Taking? Authorizing Provider  ACCU-CHEK SOFTCLIX LANCETS lancets Use as directed to check blood glucose 06/18/12  Historical Provider, MD  acetaminophen (TYLENOL) 500 MG tablet Take 500 mg by mouth every 6 (six) hours as needed for pain ("per bottle").    Historical Provider, MD  albuterol (PROVENTIL) (2.5 MG/3ML) 0.083% nebulizer solution Take 5 mg by nebulization 3 (three) times daily. Dx 496 11/17/12   Barbaraann Share, MD  bisoprolol (ZEBETA) 5 MG tablet TAKE 1 TABLET BY MOUTH TWICE DAILY 09/27/12   Ronnald Nian, MD  budesonide (PULMICORT) 0.25 MG/2ML nebulizer solution Take 2 mLs (0.25 mg total) by nebulization 4 (four) times daily. 12/27/12   Nyoka Cowden, MD  calcitRIOL (ROCALTROL) 0.25 MCG capsule Take 1 capsule  (0.25 mcg total) by mouth daily. 12/24/11   Ronnald Nian, MD  furosemide (LASIX) 40 MG tablet Take 3 tablet - 120 mg daily in am an 2 tab 80 mg in evening 05/16/12   Zannie Cove, MD  glucose blood test strip 1 each by Other route 3 (three) times daily. Use as directed to check blood glucose 08/14/12   Ronnald Nian, MD  HUMULIN N KWIKPEN 100 UNIT/ML SUPN Inject 12 Units as directed 2 (two) times daily. 10/20/12   Historical Provider, MD  ipratropium (ATROVENT) 0.02 % nebulizer solution Take 2.5 mLs (0.5 mg total) by nebulization 4 (four) times daily. DX: 496 11/17/12 11/17/13  Barbaraann Share, MD  NOVOLOG FLEXPEN 100 UNIT/ML SOPN FlexPen Inject 15 Units into the skin daily. 12/16/12   Historical Provider, MD  pantoprazole (PROTONIX) 40 MG tablet Take 30-60 min before first meal of the day 12/27/12   Nyoka Cowden, MD  paricalcitol (ZEMPLAR) 2 MCG capsule TAKE ONE CAPSULE BY MOUTH EVERY OTHER DAY 09/11/12   Ronnald Nian, MD  simvastatin (ZOCOR) 40 MG tablet TAKE 1 TABLET BY MOUTH EVERY EVENING 09/27/12   Ronnald Nian, MD     Allergies:  No Known Allergies  Social History:  Lives alone. Sister lives nearby. He has a 60 pack-year smoking history. He has never used smokeless tobacco.  He reports that he does not drink alcohol or use illicit drugs.  Family History: Family History  Problem Relation Age of Onset  . Colon cancer Neg Hx   . Cancer Brother     unsure what kind  . Heart disease Mother   . Heart disease Father   . Emphysema Father      Physical Exam: Filed Vitals:   12/31/12 1805 12/31/12 1838 12/31/12 1948 12/31/12 2024  BP:   146/84 150/57  Pulse: 83 82 79 76  Temp:    98.5 F (36.9 C)  TempSrc:    Oral  Resp: 27  22 23   SpO2: 91% 91% 92% 95%   Blood pressure 150/57, pulse 76, temperature 98.5 F (36.9 C), temperature source Oral, resp. rate 23, SpO2 95.00%.  GEN: uncomfortable, on Bipap, confused PSYCH:  alert not oriented to place or date, anxious HEENT:  Mucous membranes pink and anicteric; PERRLA; EOM intact; no cervical lymphadenopathy nor thyromegaly, neck is obese CHEST: diffuse wheezes and crackles, short shallow resps HEART:Tachycardic, regularThere are no murmur, rub, or gallops.   ABDOMEN: obese, soft and non-tender; no masses, no organomegaly, normal abdominal bowel sounds EXTREMITIES: No bone or joint deformity; no edema; no ulcerations.  There is no calf tenderness. PULSES: 1+ and symmetric CNS: Cranial nerves 2-12 grossly intact no focal lateralizing neurologic deficit.  Speech is fluent Patient is confused   Labs on Admission:  Basic Metabolic Panel:  Recent Labs Lab 12/31/12 1740  NA 142  K 4.0  CL 94*  CO2 44*  GLUCOSE 209*  BUN 47*  CREATININE 2.51*  CALCIUM 10.6*   Liver Function Tests:  Recent Labs Lab 12/31/12 1740  AST 17  ALT 15  ALKPHOS 69  BILITOT 0.4  PROT 8.3  ALBUMIN 3.9   No results found for this basename: LIPASE, AMYLASE,  in the last 168 hours No results found for this basename: AMMONIA,  in the last 168 hours CBC:  Recent Labs Lab 12/31/12 1740  WBC 13.3*  HGB 12.9*  HCT 43.5  MCV 98.2  PLT 56*   Cardiac Enzymes: No results found for this basename: CKTOTAL, CKMB, CKMBINDEX, TROPONINI,  in the last 168 hours  CBG: No results found for this basename: GLUCAP,  in the last 168 hours   Radiological Exams on Admission: Dg Chest Portable 1 View  12/31/2012   CLINICAL DATA:  Shortness of breath.  EXAM: PORTABLE CHEST - 1 VIEW  COMPARISON:  06/21/2012.  FINDINGS: 1705 hr. Lordotic positioning. The heart size and mediastinal contours are normal. The lungs are clear. There is no pleural effusion or pneumothorax. No acute osseous findings are identified. Telemetry leads overlie the chest.  IMPRESSION: No active cardiopulmonary process.   Electronically Signed   By: Roxy Horseman M.D.   On: 12/31/2012 17:19    EKG: NSR  Assessment/Plan Present on Admission:  1) Respiratory failure with  hypercapnia and hypoxia: Exacerbation of OSA/obesity hypoventilation syndrome/COPD likely due to medication non compliance.  Currently on Bipap with adequate 02 sats in the high 80% and fairly comfortable.  Sats drop into low 70's off Bipap.  ABG slightly improved (pCO2 from 110 to 98, p02 56 to 66) after 2 h on Bipap.  Will continue BiPap overnight per home regimen.  Continue with abluterol/atrovent nebs.  He has received 125 mg solumedrol in ED, will restart budesonide and will need to re-evaluate need for continued IV steroids in am.  2) CHF: Echo 04/2012 shows EF 55% and grade 2 diastolic dysfunction. Pro BNP is 952.7. He has received 40 mg lasix in ED.  Continue with home regimen of 120 mg in am and 80 mg in pm. Check daily weights, I&O's.   3) Diabetes Mellitus type 2: Check A1C.  Reviewing records he was fairly well controled on Lantus 10 u BID earlier this year.  Will restart this regimen with ssi.  Expect cbg's to be high with steriods.  4) Hypertension:  Continue with bisoprolol, lasix.    5) chronic kidney disease stage 3: Cr up to 2.5 from 1.9 in 05/2012.  Continue to monitor.  For now will continue with high dose lasix to decrease volume and facilitate respiration.   Code Status: Hayden Pedro, MD. [facility] Pager [phone] 7pm to 7am.  12/31/2012, 8:33 PM

## 2013-01-01 ENCOUNTER — Inpatient Hospital Stay (HOSPITAL_COMMUNITY): Payer: Medicare Other

## 2013-01-01 ENCOUNTER — Encounter (HOSPITAL_COMMUNITY): Payer: Self-pay

## 2013-01-01 ENCOUNTER — Telehealth: Payer: Self-pay | Admitting: Internal Medicine

## 2013-01-01 DIAGNOSIS — M7989 Other specified soft tissue disorders: Secondary | ICD-10-CM

## 2013-01-01 DIAGNOSIS — J309 Allergic rhinitis, unspecified: Secondary | ICD-10-CM

## 2013-01-01 DIAGNOSIS — N189 Chronic kidney disease, unspecified: Secondary | ICD-10-CM

## 2013-01-01 DIAGNOSIS — J9610 Chronic respiratory failure, unspecified whether with hypoxia or hypercapnia: Secondary | ICD-10-CM

## 2013-01-01 DIAGNOSIS — G4733 Obstructive sleep apnea (adult) (pediatric): Secondary | ICD-10-CM

## 2013-01-01 DIAGNOSIS — I279 Pulmonary heart disease, unspecified: Secondary | ICD-10-CM

## 2013-01-01 DIAGNOSIS — J441 Chronic obstructive pulmonary disease with (acute) exacerbation: Secondary | ICD-10-CM

## 2013-01-01 DIAGNOSIS — J9600 Acute respiratory failure, unspecified whether with hypoxia or hypercapnia: Secondary | ICD-10-CM

## 2013-01-01 LAB — BLOOD GAS, ARTERIAL
Acid-Base Excess: 12.5 mmol/L — ABNORMAL HIGH (ref 0.0–2.0)
Acid-Base Excess: 14.5 mmol/L — ABNORMAL HIGH (ref 0.0–2.0)
Bicarbonate: 43.3 mEq/L — ABNORMAL HIGH (ref 20.0–24.0)
Bicarbonate: 42.9 mEq/L — ABNORMAL HIGH (ref 20.0–24.0)
Delivery systems: POSITIVE
Delivery systems: POSITIVE
Drawn by: 307971
FIO2: 0.45 %
FIO2: 0.4 %
Inspiratory PAP: 25
O2 Saturation: 91.7 %
Patient temperature: 98.6
TCO2: 40.1 mmol/L (ref 0–100)
TCO2: 39.2 mmol/L (ref 0–100)
pCO2 arterial: 76.6 mmHg (ref 35.0–45.0)
pH, Arterial: 7.266 — ABNORMAL LOW (ref 7.350–7.450)
pH, Arterial: 7.367 (ref 7.350–7.450)
pO2, Arterial: 66.9 mmHg — ABNORMAL LOW (ref 80.0–100.0)
pO2, Arterial: 59.1 mmHg — ABNORMAL LOW (ref 80.0–100.0)

## 2013-01-01 LAB — CBC
HCT: 39.8 % (ref 39.0–52.0)
Hemoglobin: 11.5 g/dL — ABNORMAL LOW (ref 13.0–17.0)
MCH: 28.2 pg (ref 26.0–34.0)
Platelets: 52 10*3/uL — ABNORMAL LOW (ref 150–400)
RBC: 4.08 MIL/uL — ABNORMAL LOW (ref 4.22–5.81)
WBC: 8.9 10*3/uL (ref 4.0–10.5)

## 2013-01-01 LAB — HEMOGLOBIN A1C
Hgb A1c MFr Bld: 7.8 % — ABNORMAL HIGH (ref <5.7)
Mean Plasma Glucose: 177 mg/dL — ABNORMAL HIGH (ref <117)

## 2013-01-01 LAB — GLUCOSE, CAPILLARY
Glucose-Capillary: 107 mg/dL — ABNORMAL HIGH (ref 70–99)
Glucose-Capillary: 165 mg/dL — ABNORMAL HIGH (ref 70–99)

## 2013-01-01 LAB — MRSA PCR SCREENING: MRSA by PCR: POSITIVE — AB

## 2013-01-01 LAB — BASIC METABOLIC PANEL
CO2: 42 mEq/L (ref 19–32)
Calcium: 10.4 mg/dL (ref 8.4–10.5)
GFR calc non Af Amer: 21 mL/min — ABNORMAL LOW (ref >90)
Glucose, Bld: 105 mg/dL — ABNORMAL HIGH (ref 70–99)
Potassium: 4.7 mEq/L (ref 3.5–5.1)
Sodium: 140 mEq/L (ref 135–145)

## 2013-01-01 MED ORDER — ALBUTEROL SULFATE (5 MG/ML) 0.5% IN NEBU
2.5000 mg | INHALATION_SOLUTION | Freq: Four times a day (QID) | RESPIRATORY_TRACT | Status: DC
  Administered 2013-01-01 – 2013-01-04 (×12): 2.5 mg via RESPIRATORY_TRACT
  Filled 2013-01-01 (×12): qty 0.5

## 2013-01-01 MED ORDER — SALINE SPRAY 0.65 % NA SOLN
2.0000 | Freq: Two times a day (BID) | NASAL | Status: DC
  Administered 2013-01-01 – 2013-01-04 (×7): 2 via NASAL
  Filled 2013-01-01: qty 44

## 2013-01-01 MED ORDER — IPRATROPIUM BROMIDE 0.02 % IN SOLN
0.5000 mg | Freq: Four times a day (QID) | RESPIRATORY_TRACT | Status: DC
  Administered 2013-01-01 – 2013-01-04 (×12): 0.5 mg via RESPIRATORY_TRACT
  Filled 2013-01-01 (×12): qty 2.5

## 2013-01-01 MED ORDER — TECHNETIUM TC 99M DIETHYLENETRIAME-PENTAACETIC ACID
40.3000 | Freq: Once | INTRAVENOUS | Status: AC | PRN
  Administered 2013-01-01: 40.3 via INTRAVENOUS

## 2013-01-01 MED ORDER — MUPIROCIN 2 % EX OINT
1.0000 "application " | TOPICAL_OINTMENT | Freq: Two times a day (BID) | CUTANEOUS | Status: DC
  Administered 2013-01-01 – 2013-01-04 (×8): 1 via NASAL
  Filled 2013-01-01: qty 22

## 2013-01-01 MED ORDER — CHLORHEXIDINE GLUCONATE CLOTH 2 % EX PADS
6.0000 | MEDICATED_PAD | Freq: Every day | CUTANEOUS | Status: DC
  Administered 2013-01-01 – 2013-01-04 (×4): 6 via TOPICAL

## 2013-01-01 MED ORDER — BIOTENE DRY MOUTH MT LIQD
15.0000 mL | Freq: Two times a day (BID) | OROMUCOSAL | Status: DC
  Administered 2013-01-01 – 2013-01-04 (×7): 15 mL via OROMUCOSAL

## 2013-01-01 MED ORDER — FLUTICASONE PROPIONATE 50 MCG/ACT NA SUSP
2.0000 | Freq: Every day | NASAL | Status: DC
  Administered 2013-01-01 – 2013-01-04 (×4): 2 via NASAL
  Filled 2013-01-01: qty 16

## 2013-01-01 MED ORDER — TECHNETIUM TO 99M ALBUMIN AGGREGATED
5.5000 | Freq: Once | INTRAVENOUS | Status: AC | PRN
  Administered 2013-01-01: 6 via INTRAVENOUS

## 2013-01-01 MED ORDER — METHYLPREDNISOLONE SODIUM SUCC 40 MG IJ SOLR
40.0000 mg | Freq: Two times a day (BID) | INTRAMUSCULAR | Status: DC
  Administered 2013-01-01 – 2013-01-02 (×4): 40 mg via INTRAVENOUS
  Filled 2013-01-01 (×6): qty 1

## 2013-01-01 MED ORDER — AMOXICILLIN-POT CLAVULANATE 875-125 MG PO TABS
1.0000 | ORAL_TABLET | Freq: Two times a day (BID) | ORAL | Status: DC
  Administered 2013-01-01 – 2013-01-04 (×7): 1 via ORAL
  Filled 2013-01-01 (×8): qty 1

## 2013-01-01 MED ORDER — CHLORHEXIDINE GLUCONATE 0.12 % MT SOLN
15.0000 mL | Freq: Two times a day (BID) | OROMUCOSAL | Status: DC
  Administered 2013-01-01 – 2013-01-04 (×5): 15 mL via OROMUCOSAL
  Filled 2013-01-01 (×8): qty 15

## 2013-01-01 NOTE — Progress Notes (Signed)
Inpatient Diabetes Program Recommendations  AACE/ADA: New Consensus Statement on Inpatient Glycemic Control (2013)  Target Ranges:  Prepandial:   less than 140 mg/dL      Peak postprandial:   less than 180 mg/dL (1-2 hours)      Critically ill patients:  140 - 180 mg/dL   Reason for Visit: Elevated HgbA1C Results for [name] [name], [name] [name] (MRN [id number]) as of 01/01/2013 17:12  Ref. Range 05/12/2012 16:45 01/01/2013 03:40  Hemoglobin A1C Latest Range: <5.7 % 7.2 (H) 7.8 (H)    Inpatient Diabetes Program Recommendations Insulin - Meal Coverage: May need meal coverage insulin if CBGs >180 mg/dL - Novolog 3 units tidwc if pt eats >50% meal HgbA1C: 7.8% - up from 7.2% in March 2014  Note: Will follow. Thank you. Ailene Ards, RD, LDN, CDE Inpatient Diabetes Coordinator [phone]

## 2013-01-01 NOTE — Progress Notes (Signed)
[facility] PROGRESS NOTE  [name] [name]:[id number] DOB: [birth year] DOA: 12/31/2012 PCP: Georgianne Fick, MD  Assessment/Plan: Present on Admission:  . Respiratory failure with hypercapnia/COPD exacerbation/Cor pulmonale/OSA (obstructive sleep apnea) -will continue BIPAP for now, obtain ABG follow -continue bronchodilators, and solumedrol - I have consulted CCM for further recs, pt is followed by [name] oupt . Diastolic CHF (congestive heart failure), acute on chronic -continue po lasix  -Cr trending up, monitor closely and adjust dose accordingly . DIABETES MELLITUS, TYPE II -A1C is 7.8 -continue lantus with SSI coverage . HYPERLIPIDEMIA -continue statin . THROMBOCYTOPENIA -stable, no gross bleeding . HYPERTENSION -continue bisoprolol . DEPRESSION  . Acute on Chronic renal failure -Cr trending up2.51>>2.8   Code Status: full Family Communication: none  Disposition Plan: keep in ICU for now >>still requiring bipap   Consultants:  Pulm, await -eval  Procedures:  none  Antibiotics:  Augmentin- started on 11/3  HPI/Subjective: On Bipap taking it off intermittently stating he wants to be able to talk. Gets sob with  the BIPAP off.  Objective: Filed Vitals:   01/01/13 0800  BP: 164/74  Pulse: 66  Temp:   Resp: 20   No intake or output data in the 24 hours ending 01/01/13 0901 Filed Weights   12/31/12 2213 01/01/13 0457  Weight: 123.1 kg (271 lb 6.2 oz) 122.8 kg (270 lb 11.6 oz)    Exam:  General: alert & oriented x 3 In NAD, BIPAP on Cardiovascular: RRR, nl S1 s2 Respiratory: moderate air mov't,no crackles, few basilar crackles Abdomen: soft +BS NT/ND, no masses palpable Extremities:  2+ RLEedema, +1LLE edema, no cyanosis    Data Reviewed: Basic Metabolic Panel:  Recent Labs Lab 12/31/12 1740 01/01/13 0340  NA 142 140  K 4.0 4.7  CL 94* 95*  CO2 44* 42*  GLUCOSE 209* 105*  BUN 47* 51*  CREATININE 2.51* 2.80*  CALCIUM 10.6*  10.4   Liver Function Tests:  Recent Labs Lab 12/31/12 1740  AST 17  ALT 15  ALKPHOS 69  BILITOT 0.4  PROT 8.3  ALBUMIN 3.9   No results found for this basename: LIPASE, AMYLASE,  in the last 168 hours No results found for this basename: AMMONIA,  in the last 168 hours CBC:  Recent Labs Lab 12/31/12 1740 01/01/13 0340  WBC 13.3* 8.9  HGB 12.9* 11.5*  HCT 43.5 39.8  MCV 98.2 97.5  PLT 56* 52*   Cardiac Enzymes: No results found for this basename: CKTOTAL, CKMB, CKMBINDEX, TROPONINI,  in the last 168 hours BNP (last 3 results)  Recent Labs  05/12/12 1300 12/31/12 1740  PROBNP 1198.0* 952.7*   CBG:  Recent Labs Lab 12/31/12 2155 01/01/13 0757  GLUCAP 75 101*    Recent Results (from the past 240 hour(s))  MRSA PCR SCREENING     Status: Abnormal   Collection Time    12/31/12  9:56 PM      Result Value Range Status   MRSA by PCR POSITIVE (*) NEGATIVE Final   Comment:            The GeneXpert MRSA Assay (FDA     approved for NASAL specimens     only), is one component of a     comprehensive MRSA colonization     surveillance program. It is not     intended to diagnose MRSA     infection nor to guide or     monitor treatment for     MRSA infections.  RESULT CALLED TO, READ BACK BY AND VERIFIED WITH:     CARMICHAEL,J RN @0120  ON 11.3.2014 BY MCREYNOLDS,B     Studies: Dg Chest Portable 1 View  12/31/2012   CLINICAL DATA:  Shortness of breath.  EXAM: PORTABLE CHEST - 1 VIEW  COMPARISON:  06/21/2012.  FINDINGS: 1705 hr. Lordotic positioning. The heart size and mediastinal contours are normal. The lungs are clear. There is no pleural effusion or pneumothorax. No acute osseous findings are identified. Telemetry leads overlie the chest.  IMPRESSION: No active cardiopulmonary process.   Electronically Signed   By: Roxy Horseman M.D.   On: 12/31/2012 17:19    Scheduled Meds: . albuterol  2.5 mg Nebulization Q6H  . antiseptic oral rinse  15 mL Mouth Rinse  q12n4p  . bisoprolol  5 mg Oral Daily  . budesonide (PULMICORT) nebulizer solution  0.25 mg Nebulization BID  . calcitRIOL  0.25 mcg Oral Daily  . chlorhexidine  15 mL Mouth Rinse BID  . Chlorhexidine Gluconate Cloth  6 each Topical Q0600  . furosemide  80 mg Oral BID  . guaiFENesin  600 mg Oral BID  . heparin  5,000 Units Subcutaneous Q8H  . insulin aspart  0-20 Units Subcutaneous TID WC  . insulin glargine  10 Units Subcutaneous BID  . ipratropium  0.5 mg Nebulization Q6H  . mupirocin ointment  1 application Nasal BID  . pantoprazole  40 mg Oral Daily  . simvastatin  20 mg Oral q1800   Continuous Infusions:   Active Problems:   DIABETES MELLITUS, TYPE II   HYPERLIPIDEMIA   Morbid obesity   THROMBOCYTOPENIA   TOBACCO USER   DEPRESSION   HYPERTENSION   COPD GOLD III with reversibility   OSA (obstructive sleep apnea)   Cor pulmonale   Chronic renal failure   CHF (congestive heart failure)   Respiratory failure with hypercapnia   COPD exacerbation    Time spent: >35    [name]  [facility] Pager [phone]. If 7PM-7AM, please contact night-coverage at [email or web address], password [facility] 01/01/2013, 9:01 AM  LOS: 1 day

## 2013-01-01 NOTE — Progress Notes (Signed)
Placed patient on BIPAP on 25/8 . Patient tolerating well at this time.

## 2013-01-01 NOTE — Progress Notes (Signed)
CRITICAL VALUE ALERT  Critical value received: CO2 42  Date of notification: 01/01/2013  Time of notification:  4:29 AM  Critical value read back:yes  Nurse who received alert:  Everette Rank, RN  MD notified (1st page):  Benedetto Coons  Time of first page:  [phone]  MD notified (2nd page):  Time of second page:  Responding MD:  Benedetto Coons  Time MD responded:  [phone]  Aware, no new orders received.

## 2013-01-01 NOTE — Telephone Encounter (Signed)
I spoke with the pt daughter and she was calling in to give updated meds for the pt. I reviewed the med list with what she had in hand and we fixed it accordingly. Pt will still schedule appt with TP in 4 weeks. Carron Curie, CMA

## 2013-01-01 NOTE — Progress Notes (Signed)
CARE MANAGEMENT NOTE 01/01/2013  Patient:  [name] [name], [name] [name]   Account Number:  [id number]  Date Initiated:  01/01/2013  Documentation initiated by:  DAVIS,RHONDA  Subjective/Objective Assessment:   pt with resp failure requring bipap for support.     Action/Plan:   home when table   Anticipated DC Date:  01/04/2013   Anticipated DC Plan:  HOME/SELF CARE  In-house referral  NA      DC Planning Services  NA      PAC Choice  NA   Choice offered to / List presented to:  NA   DME arranged  NA      DME agency  NA     HH arranged  NA      HH agency  NA   Status of service:  In process, will continue to follow Medicare Important Message given?  NA - LOS <3 / Initial given by admissions (If response is "NO", the following Medicare IM given date fields will be blank) Date Medicare IM given:   Date Additional Medicare IM given:    Discharge Disposition:    Per UR Regulation:  Reviewed for med. necessity/level of care/duration of stay  If discussed at Long Length of Stay Meetings, dates discussed:    Comments:  [id number]/Rhonda Stark Jock, BSN, [place] [phone] Chart Reviewed for discharge and hospital needs. Discharge needs at time of review:  None Review of patient progress due on [id number].

## 2013-01-01 NOTE — Progress Notes (Signed)
*  Preliminary Results* Bilateral lower extremity venous duplex completed. Bilateral lower extremities are negative for deep vein thrombosis. There is evidence of a right Baker's cyst.  Preliminary results discussed with Denny Peon, RN.  01/01/2013  Gertie Fey, RVT, RDCS, RDMS

## 2013-01-01 NOTE — Consult Note (Signed)
PULMONARY  / CRITICAL CARE MEDICINE  Name: [name] [name] MRN: [id number] DOB: 06-06-1940    ADMISSION DATE:  12/31/2012 CONSULTATION DATE:  11/3 REFERRING MD :  Suanne Marker PRIMARY SERVICE:  [facility]  CHIEF COMPLAINT:  dyspnea  BRIEF PATIENT DESCRIPTION:  This is a 72 year old male f/b [name] w/ known h/o chronic resp failure on basis of GOLD III/IV class D COPD, OHS/OSA, and gd II diastolic dysfxn, chronically on daytime oxygen and nocturnal BIPAP. Admitted on 11/2 w/ 4 wk h/o gradually progressive dyspnea and O2 sats of 70% on 5 liters pulsed O2.   SIGNIFICANT EVENTS: 11/02 Admit to ICU  STUDIES:  LE [place] 11/3>>> VQ scan 11/3>>>  LINES / TUBES: PIV  CULTURES: MRSA screen 11/02 >> POSITIVE  ANTIBIOTICS:   HISTORY OF PRESENT ILLNESS:   This is a 72 year old male f/b [name] for chronic resp failure in the setting of Gold stage III/IV, class D COPD, OHS/OSA, and gd II diastolic dysfxn. Last seen in our office on 10/29 reporting ~4week h/o progressive dyspnea and decreased activity tol. At time of visit his sats were 70% on pulse 5 liters. He presented to the ER on 11/2 w/ progression of his dyspnea to the point that he was SOB at rest. He reports the dyspnea has been gradually progressive over the last 4 weeks, he reports chronic nasal congestion, chronic cough, mostly in the am and pm which is productive of clear sputum. Denies sick exposure, fever or chills. Denies CP. Does have LE swelling. Has had some light headedness from time to time. Was admitted by IM service. Treatment to date has included: NIPPV, diuresis, systemic steroids and BDs. He feels better after this. PCCM has been asked to see re: acute on chronic resp failure.   PAST MEDICAL HISTORY :  Past Medical History  Diagnosis Date  . Renal insufficiency   . Diabetes mellitus   . GERD (gastroesophageal reflux disease)   . Gastroparesis   . Esophageal stricture   . Hyperlipemia   . Hypertension   . Pancreatitis   .  Chronic respiratory disease   . Depression   . Seizure disorder   . Arthritis   . Diverticulosis   . Sleep apnea     CPAP Machine  . Obesity   . ED (erectile dysfunction)   . Sleep apnea   . COPD (chronic obstructive pulmonary disease)     Gold Stage  III/IV  . H/O polycythemia   . Chronic kidney disease (CKD), stage III (moderate)   . DDD (degenerative disc disease), lumbar   . History of esophageal stricture   . Erectile dysfunction   . Peptic ulcer disease   . History of colon polyps   . Gout   . Gastroparesis   . Acute encephalopathy 06/20/2012  . 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 CHF (congestive heart failure) 05/19/2012  . DIVERTICULOSIS, COLON 08/05/2009    Qualifier: Diagnosis of  By: Koleen Distance CMA (AAMA), Hulan Saas    . Obesity hypoventilation syndrome 04/28/2011  . OSA (obstructive sleep apnea) 09/22/2006    Auto 2013:  Optimal pressure 13/10   . SEIZURE DISORDER 09/22/2006    Qualifier: Diagnosis of  By: Nena Jordan   . THROMBOCYTOPENIA 04/17/2007    Chronic, Qualifier: Diagnosis of  By: Thereasa Solo     Past Surgical History  Procedure Laterality Date  . Prostate surgery    .  Coronary artery bypass graft      x 4  . Bone marrow biopsy    . Cardiac catheterization  05/02/2008  . Back surgery      lumbar   Prior to Admission medications   Medication Sig Start Date End Date Taking? Authorizing Provider  acetaminophen (TYLENOL) 500 MG tablet Take 500 mg by mouth every 6 (six) hours as needed for pain ("per bottle").   Yes Historical Provider, MD  albuterol (PROVENTIL) (2.5 MG/3ML) 0.083% nebulizer solution Take 5 mg by nebulization 3 (three) times daily. Dx 496 11/17/12  Yes Barbaraann Share, MD  bisoprolol (ZEBETA) 5 MG tablet TAKE 1 TABLET BY MOUTH TWICE DAILY 09/27/12  Yes Ronnald Nian, MD  budesonide (PULMICORT) 0.25 MG/2ML nebulizer solution Take 2 mLs (0.25 mg total) by nebulization 4  (four) times daily. 12/27/12  Yes Nyoka Cowden, MD  calcitRIOL (ROCALTROL) 0.25 MCG capsule Take 1 capsule (0.25 mcg total) by mouth daily. 12/24/11  Yes Ronnald Nian, MD  furosemide (LASIX) 40 MG tablet Take 3 tablet - 120 mg daily in am an 2 tab 80 mg in evening 05/16/12  Yes Zannie Cove, MD  insulin aspart (NOVOLOG) 100 UNIT/ML injection Inject 20 Units into the skin 3 (three) times daily with meals.   Yes Historical Provider, MD  insulin NPH (HUMULIN N) 100 UNIT/ML injection Inject 20-22 Units into the skin 2 (two) times daily. Take 20 units in the morning and 22 units at night   Yes Historical Provider, MD  ipratropium (ATROVENT) 0.02 % nebulizer solution Take 2.5 mLs (0.5 mg total) by nebulization 4 (four) times daily. DX: 496 11/17/12 11/17/13 Yes Barbaraann Share, MD  pantoprazole (PROTONIX) 40 MG tablet Take 30-60 min before first meal of the day 12/27/12  Yes Nyoka Cowden, MD  paricalcitol (ZEMPLAR) 2 MCG capsule TAKE ONE CAPSULE BY MOUTH EVERY OTHER DAY 09/11/12  Yes Ronnald Nian, MD  simvastatin (ZOCOR) 40 MG tablet TAKE 1 TABLET BY MOUTH EVERY EVENING 09/27/12  Yes Ronnald Nian, MD   No Known Allergies  FAMILY HISTORY:  Family History  Problem Relation Age of Onset  . Colon cancer Neg Hx   . Cancer Brother     unsure what kind  . Heart disease Mother   . Heart disease Father   . Emphysema Father    SOCIAL HISTORY:  reports that he quit smoking about 7 months ago. His smoking use included Cigarettes. He has a 60 pack-year smoking history. He has never used smokeless tobacco. He reports that he does not drink alcohol or use illicit drugs.  REVIEW OF SYSTEMS (bolds are positive):   Constitutional: Negative for fever, chills, weight loss, malaise/fatigue and diaphoresis.  HENT: Negative for hearing loss, ear pain, nosebleeds, congestion, sore throat, neck pain, tinnitus and ear discharge.   Eyes: Negative for blurred vision, double vision, photophobia, pain, discharge and  redness.  Respiratory cough, chronic, mostly in am after taking albuterol resulting in clear mucous, hemoptysis, shortness of breath, has been worse over the last 4 weeks, states initially feels better after his am neb, then by time he walks to kitchen from bathroom he is severely short of breath, for the last few weeks he uses his BIPAP to catch his breath, wheezing is about at baseline, improves briefly with SABA, denies stridor.   Cardiovascular: Negative for chest pain, palpitations, orthopnea, claudication, leg swelling, right > left and PND, At times + lightheadedness .  Gastrointestinal: Negative for heartburn,  nausea, vomiting, abdominal pain, diarrhea, constipation, blood in stool and melena.  Genitourinary: Negative for dysuria, urgency, frequency, hematuria and flank pain.  Musculoskeletal: Negative for myalgias, back pain, joint pain and falls.  Skin: Negative for itching and rash.  Neurological: Negative for dizziness, tingling, tremors, sensory change, speech change, focal weakness, seizures, loss of consciousness, weakness and headaches.  Endo/Heme/Allergies: Negative for environmental allergies and polydipsia. Does not bruise/bleed easily.  SUBJECTIVE:  Feels better, but still has cough and chest/sinus congestion.  Also has Rt > Lt leg swelling.  VITAL SIGNS: Temp:  [98.3 F (36.8 C)-98.9 F (37.2 C)] 98.9 F (37.2 C) (11/03 0400) Pulse Rate:  [66-100] 66 (11/03 0800) Resp:  [15-30] 20 (11/03 0800) BP: (107-172)/(46-84) 164/74 mmHg (11/03 0800) SpO2:  [88 %-98 %] 97 % (11/03 0800) FiO2 (%):  [40 %-45 %] 40 % (11/03 0800) Weight:  [122.8 kg (270 lb 11.6 oz)-123.1 kg (271 lb 6.2 oz)] 122.8 kg (270 lb 11.6 oz) (11/03 0457) 5 liters   PHYSICAL EXAMINATION: General:  Obese aam, in no acute distress, feels better off NIPPV  Neuro:  Awake, alert and w/out focal def  HEENT:  [place]. No jVD  Cardiovascular:  rrr Lungs:  Prolonged exp wheeze Abdomen:  Obese + bowel sounds   Musculoskeletal:  Intact  Skin:  Right > left LE edema   Labs: CBC Recent Labs     12/31/12  1740  01/01/13  0340  WBC  13.3*  8.9  HGB  12.9*  11.5*  HCT  43.5  39.8  PLT  56*  52*   BMET Recent Labs     12/31/12  1740  01/01/13  0340  NA  142  140  K  4.0  4.7  CL  94*  95*  CO2  44*  42*  BUN  47*  51*  CREATININE  2.51*  2.80*  GLUCOSE  209*  105*    Electrolytes Recent Labs     12/31/12  1740  01/01/13  0340  CALCIUM  10.6*  10.4   ABG Recent Labs     12/31/12  1725  12/31/12  2025  01/01/13  0933  PHART  7.244*  7.266*  7.367  PCO2ART  110.0*  98.3*  76.6*  PO2ART  56.6*  66.9*  59.1*    Liver Enzymes Recent Labs     12/31/12  1740  AST  17  ALT  15  ALKPHOS  69  BILITOT  0.4  ALBUMIN  3.9    Cardiac Enzymes Recent Labs     12/31/12  1740  PROBNP  952.7*    Glucose Recent Labs     12/31/12  2155  01/01/13  0757  GLUCAP  75  101*    Imaging Dg Chest Portable 1 View  12/31/2012   CLINICAL DATA:  Shortness of breath.  EXAM: PORTABLE CHEST - 1 VIEW  COMPARISON:  06/21/2012.  FINDINGS: 1705 hr. Lordotic positioning. The heart size and mediastinal contours are normal. The lungs are clear. There is no pleural effusion or pneumothorax. No acute osseous findings are identified. Telemetry leads overlie the chest.  IMPRESSION: No active cardiopulmonary process.   Electronically Signed   By: Roxy Horseman M.D.   On: 12/31/2012 17:19      ASSESSMENT / PLAN:  A: Acute on Chronic respiratory failure (multi-factorial): in setting of decompensated OHS/OSA ,  cor pulmonale, and decompensated diastolic HF, super-imposed on underlying GOLD st D COPD +/- mild AECOPD and post-nasal  gtt.  >May be mild element of acute exacerbation. Still w/ prolonged wheeze.  >he is at risk for thromboembolic disease. This could also explain his worsening of symptoms.  P: Get LE dopplers AND V/Q scan Add nasal hygiene to assist w/ PND component   Continue  scheduled BDs Will add low dose systemic steroids.  Continue Diuresis as BUN/Creatinie will allow (we may be getting close here as Creatinine has bumped) Continue supplemental oxygen for sats >88% Continue nocturnal and PRN BIPAP  A: Chronic renal failure /CKD stage III Baseline scr in the 1.9-->2.1 range  P: Close observation of chemistry May need to back down on diuretics Renal dose meds  A: HTN P: Per IM   A: DM P: Per IM     Pulmonary and Critical Care Medicine [facility] Pager: [phone]  01/01/2013, 10:16 AM  Reviewed above, examined pt, and documentation changes made as needed.  He has acute on chronic dyspnea and respiratory failure.  Concern is for COPD exacerbation in setting of OSA/OHS.  Also has Rt > Lt leg swelling >> ?if he has thrombo-embolic disease.  Continue scheduled nebs.  Adjust oxygen to keep SpO2 > 90%.  Add solumedrol.  Continue qhs BiPAP, but change to as needed during the day.  Will adjust sinus regimen.  Check doppler legs and check V/Q scan to further assess thrombo-embolic disease.  Will also add augmentin to cover sinuses and lungs.  [name], MD [facility] Pulmonary/Critical Care 01/01/2013, 11:05 AM Pager:  [phone] After 3pm call: [phone]

## 2013-01-02 ENCOUNTER — Encounter (HOSPITAL_COMMUNITY): Payer: Self-pay | Admitting: Surgery

## 2013-01-02 DIAGNOSIS — J449 Chronic obstructive pulmonary disease, unspecified: Secondary | ICD-10-CM

## 2013-01-02 LAB — GLUCOSE, CAPILLARY
Glucose-Capillary: 226 mg/dL — ABNORMAL HIGH (ref 70–99)
Glucose-Capillary: 174 mg/dL — ABNORMAL HIGH (ref 70–99)
Glucose-Capillary: 216 mg/dL — ABNORMAL HIGH (ref 70–99)
Glucose-Capillary: 308 mg/dL — ABNORMAL HIGH (ref 70–99)

## 2013-01-02 NOTE — Progress Notes (Signed)
PULMONARY  / CRITICAL CARE MEDICINE  Name: [name] [name] MRN: [id number] DOB: Aug 07, 1940    ADMISSION DATE:  12/31/2012 CONSULTATION DATE:  11/3 REFERRING MD :  [name] [name] PRIMARY SERVICE:  [facility]  CHIEF COMPLAINT:  dyspnea  BRIEF PATIENT DESCRIPTION:  This is a 72 year old male f/b [name] w/ known h/o chronic resp failure on basis of GOLD III/IV class D COPD, OHS/OSA, and gd II diastolic dysfxn, chronically on daytime oxygen and nocturnal BIPAP. Admitted on 11/2 w/ 4 wk h/o gradually progressive dyspnea and O2 sats of 70% on 5 liters pulsed O2.   SIGNIFICANT EVENTS: 11/02 Admit to ICU  STUDIES:  LE [place] 11/3>>>negative for DVT.  Rt Baker's cyst present. VQ scan 11/3>>>Very low probability  LINES / TUBES: PIV  CULTURES: MRSA screen 11/02 >> POSITIVE  ANTIBIOTICS: augmentin 11/3>>>  SUBJECTIVE:  Feels better  VITAL SIGNS: Temp:  [98.1 F (36.7 C)-98.4 F (36.9 C)] 98.1 F (36.7 C) (11/03 2000) Pulse Rate:  [59-78] 78 (11/04 0751) Resp:  [17-28] 22 (11/04 0751) BP: (124-145)/(36-66) 124/36 mmHg (11/04 0751) SpO2:  [92 %-100 %] 97 % (11/04 0751) FiO2 (%):  [40 %] 40 % (11/04 0400) Weight:  [121.5 kg (267 lb 13.7 oz)] 121.5 kg (267 lb 13.7 oz) (11/04 0500) 5 -->4 liters   PHYSICAL EXAMINATION: General:  Obese aam, feeling better  Neuro:  Awake, alert and w/out focal def  HEENT:  [place]. No jVD  Cardiovascular:  rrr Lungs:  Prolonged phase, no wheeze, occ rhonchi  Abdomen:  Obese + bowel sounds  Musculoskeletal:  Intact  Skin:  Right > left LE edema   Labs: CBC Recent Labs     12/31/12  1740  01/01/13  0340  WBC  13.3*  8.9  HGB  12.9*  11.5*  HCT  43.5  39.8  PLT  56*  52*   BMET Recent Labs     12/31/12  1740  01/01/13  0340  NA  142  140  K  4.0  4.7  CL  94*  95*  CO2  44*  42*  BUN  47*  51*  CREATININE  2.51*  2.80*  GLUCOSE  209*  105*    Electrolytes Recent Labs     12/31/12  1740  01/01/13  0340  CALCIUM  10.6*  10.4   ABG Recent Labs      12/31/12  1725  12/31/12  2025  01/01/13  0933  PHART  7.244*  7.266*  7.367  PCO2ART  110.0*  98.3*  76.6*  PO2ART  56.6*  66.9*  59.1*    Liver Enzymes Recent Labs     12/31/12  1740  AST  17  ALT  15  ALKPHOS  69  BILITOT  0.4  ALBUMIN  3.9    Cardiac Enzymes Recent Labs     12/31/12  1740  PROBNP  952.7*    Glucose Recent Labs     12/31/12  2155  01/01/13  0757  01/01/13  1144  01/01/13  1625  01/01/13  2138  01/02/13  0749  GLUCAP  75  101*  107*  165*  152*  226*    Imaging Nm Pulmonary Perf And Vent  01/01/2013   CLINICAL DATA:  Short of breath, elevated BUN and creatinine.  EXAM: NUCLEAR MEDICINE VENTILATION - PERFUSION LUNG SCAN  TECHNIQUE: Ventilation images were obtained in multiple projections using inhaled aerosol technetium 99 M DTPA. Perfusion images were obtained in multiple projections  after intravenous injection of Tc-70m MAA.  COMPARISON:  None.  RADIOPHARMACEUTICALS:  40.3 mCi Tc-54m DTPA aerosol and 5.09 mCi Tc-63m MAA  FINDINGS: Ventilation: There is significant heterogeneity of ventilation without focal defect.  Perfusion: There is mild heterogeneity of perfusion which matches the heterogeneity of the ventilation. No wedge-shaped peripheral perfusion defects.  IMPRESSION: Very low probability for acute pulmonary embolism. Heterogeneous perfusion and ventilation consistent with COPD.   Electronically Signed   By: Genevive Bi M.D.   On: 01/01/2013 15:32   Dg Chest Portable 1 View  12/31/2012   CLINICAL DATA:  Shortness of breath.  EXAM: PORTABLE CHEST - 1 VIEW  COMPARISON:  06/21/2012.  FINDINGS: 1705 hr. Lordotic positioning. The heart size and mediastinal contours are normal. The lungs are clear. There is no pleural effusion or pneumothorax. No acute osseous findings are identified. Telemetry leads overlie the chest.  IMPRESSION: No active cardiopulmonary process.   Electronically Signed   By: Roxy Horseman M.D.   On: 12/31/2012 17:19       ASSESSMENT / PLAN:  A: Acute on Chronic respiratory failure (multi-factorial): in setting of decompensated OHS/OSA ,  cor pulmonale, and decompensated diastolic HF, super-imposed on underlying GOLD st D COPD +/- mild AECOPD and post-nasal gtt.  >May be mild element of acute exacerbation. Still w/ prolonged wheeze, but improved.  >better w/ current rx focusing on rx for AECOPD AND diuresis  P: Continue nasal hygiene  Continue scheduled BDs Continue systemic steroids  Keep euvolemic at this point Continue supplemental oxygen for sats >88% Continue nocturnal  BIPAP  A: Chronic renal failure /CKD stage III Baseline scr in the 1.9-->2.1 range  Scr climbing w/ current rx  P: Close observation of chemistry Renal dose meds Hold diuretics   A: HTN, hyperlipidemia P: Continue zebeta, zocor  A: DM P: SSI with lantus   Will move to med/surg Looking better   01/02/2013, 8:16 AM  Reviewed above, examined pt, and documentation changes made as needed.  Respiratory status improved with augmentation in COPD regimen, and sinus regimen.  Transfer to floor bed today.  If he continues to improve, then anticipate d/c home in next 24 to 48 hrs.  [name], MD [facility] Pulmonary/Critical Care 01/02/2013, 9:42 AM Pager:  [phone] After 3pm call: [phone]

## 2013-01-02 NOTE — Progress Notes (Signed)
Dr. Marchelle Gearing called due to pt having a CBG of 308 with no HS coverage. Pt ordered 10 units Lantus for tonight. No additional orders given at this time.

## 2013-01-02 NOTE — Progress Notes (Signed)
Sugars 300  He is due for lantus now  Plan Continue lantus  - continue ssi; no night time coveage - continue to monitor  Dr. Kalman Shan, M.D., [employer].C.P Pulmonary and Critical Care Medicine Staff Physician [facility] Pulmonary and Critical Care Pager: [phone], If no answer or between  15:00h - 7:00h: call [phone]  01/02/2013 9:38 PM

## 2013-01-02 NOTE — Progress Notes (Signed)
Placed patient on CPAP. Placed patient on auto titrate. Patient is tolerating well at this time. RN notified to call RT if needed. Will continue to monitor.

## 2013-01-02 NOTE — Progress Notes (Signed)
[facility] PROGRESS NOTE  [name] [name] WGN:[id number] DOB: 01/08/1941 DOA: 12/31/2012 PCP: Georgianne Fick, MD I discussed pt with Pete/PCCM today and they have taken pt onto their service, appreciate assistance, please call us if needed.   [name]  [facility] Pager [phone]. If 7PM-7AM, please contact night-coverage at [email or web address], password [facility] 01/02/2013, 8:37 AM  LOS: 2 days

## 2013-01-03 ENCOUNTER — Telehealth: Payer: Self-pay | Admitting: Adult Health

## 2013-01-03 DIAGNOSIS — J9610 Chronic respiratory failure, unspecified whether with hypoxia or hypercapnia: Secondary | ICD-10-CM

## 2013-01-03 LAB — CBC
HCT: 40.2 % (ref 39.0–52.0)
MCHC: 29.9 g/dL — ABNORMAL LOW (ref 30.0–36.0)
MCV: 95.3 fL (ref 78.0–100.0)
Platelets: 84 10*3/uL — ABNORMAL LOW (ref 150–400)
RBC: 4.22 MIL/uL (ref 4.22–5.81)
RDW: 13.8 % (ref 11.5–15.5)
WBC: 11.6 10*3/uL — ABNORMAL HIGH (ref 4.0–10.5)

## 2013-01-03 LAB — BASIC METABOLIC PANEL
CO2: 39 mEq/L — ABNORMAL HIGH (ref 19–32)
Calcium: 10.4 mg/dL (ref 8.4–10.5)
Chloride: 93 mEq/L — ABNORMAL LOW (ref 96–112)
Creatinine, Ser: 2.83 mg/dL — ABNORMAL HIGH (ref 0.50–1.35)
GFR calc Af Amer: 24 mL/min — ABNORMAL LOW (ref >90)
GFR calc non Af Amer: 20 mL/min — ABNORMAL LOW (ref >90)
Sodium: 139 mEq/L (ref 135–145)

## 2013-01-03 LAB — GLUCOSE, CAPILLARY
Glucose-Capillary: 264 mg/dL — ABNORMAL HIGH (ref 70–99)
Glucose-Capillary: 145 mg/dL — ABNORMAL HIGH (ref 70–99)

## 2013-01-03 MED ORDER — INSULIN NPH (HUMAN) (ISOPHANE) 100 UNIT/ML ~~LOC~~ SUSP
20.0000 [IU] | Freq: Two times a day (BID) | SUBCUTANEOUS | Status: DC
  Administered 2013-01-03 – 2013-01-04 (×3): 20 [IU] via SUBCUTANEOUS
  Filled 2013-01-03: qty 10

## 2013-01-03 MED ORDER — PREDNISONE 20 MG PO TABS
40.0000 mg | ORAL_TABLET | Freq: Every day | ORAL | Status: DC
  Administered 2013-01-03 – 2013-01-04 (×2): 40 mg via ORAL
  Filled 2013-01-03 (×3): qty 2

## 2013-01-03 MED ORDER — PREDNISONE 20 MG PO TABS
40.0000 mg | ORAL_TABLET | Freq: Every day | ORAL | Status: DC
  Filled 2013-01-03: qty 2

## 2013-01-03 MED ORDER — FUROSEMIDE 80 MG PO TABS
80.0000 mg | ORAL_TABLET | Freq: Two times a day (BID) | ORAL | Status: DC
  Administered 2013-01-03 – 2013-01-04 (×3): 80 mg via ORAL
  Filled 2013-01-03 (×5): qty 1

## 2013-01-03 NOTE — Progress Notes (Signed)
SATURATION QUALIFICATIONS: (This note is used to comply with regulatory documentation for home oxygen)  Patient Saturations on Room Air at Rest = 84%  Patient Saturations on 3 L while Ambulating = 78%  Patient Saturations on 6 Liters of oxygen while Ambulating = 88%  Please briefly explain why patient needs home oxygen:  Patient requires 3 L 02 at rest.  Oxygen saturation 78% on 3L while ambulating.  Oxygen had to be increased to 6L to get o2 sats above 88%

## 2013-01-03 NOTE — Telephone Encounter (Signed)
Pt scheduled w/ TP for HFU on 11.12.14 Per discharge, bmet to be repeated on 11.10.14 Order placed Will sign off.

## 2013-01-03 NOTE — ED Provider Notes (Signed)
Medical screening examination/treatment/procedure(s) were conducted as a shared visit with non-physician practitioner(s) and myself.  I personally evaluated the patient during the encounter.  EKG Interpretation     Ventricular Rate:  97 PR Interval:  150 QRS Duration: 83 QT Interval:  357 QTC Calculation: 453 R Axis:   74 Text Interpretation:  Sinus rhythm Baseline wander in lead(s) I V4 V5 compared to prior, pvc's no longer present.            I interviewed and examined the patient. Lungs w/ dec BS and mild wheezing bilaterally. Cardiac exam wnl. Abdomen soft.  No significant change in LE edema per pt. ABG c/w showing hypercarbia. Pt maintains normal MS, will place on bipap for hypercarbic respiratory failure.   Pt continues to do well on bipap, will admit to hospitalist to stepdown bed. ABG mildly improving.   CRITICAL CARE Performed by: Purvis Sheffield, S Total critical care time: 30 min Critical care time was exclusive of separately billable procedures and treating other patients. Critical care was necessary to treat or prevent imminent or life-threatening deterioration. Critical care was time spent personally by me on the following activities: development of treatment plan with patient and/or surrogate as well as nursing, discussions with consultants, evaluation of patient's response to treatment, examination of patient, obtaining history from patient or surrogate, ordering and performing treatments and interventions, ordering and review of laboratory studies, ordering and review of radiographic studies, pulse oximetry and re-evaluation of patient's condition.  Critical care time documented in this patient with a PCO2 > 100 requiring bipap for hypercarbic respiratory failure. Critical care time included focused medical decision making, chart review, frequent rechecks, discussions w/ family, and consultation w/ hospitalist for admission.   Junius Argyle, MD 01/03/13  [phone]

## 2013-01-03 NOTE — Progress Notes (Signed)
PULMONARY  / CRITICAL CARE MEDICINE  Name: [name] [name] MRN: [id number] DOB: 02-02-1941    ADMISSION DATE:  12/31/2012 CONSULTATION DATE:  11/3 REFERRING MD :  Suanne Marker PRIMARY SERVICE:  [facility]  CHIEF COMPLAINT:  dyspnea  BRIEF PATIENT DESCRIPTION:  This is a 72 year old male f/b [name] w/ known h/o chronic resp failure on basis of GOLD III/IV class D COPD, OHS/OSA, and gd II diastolic dysfxn, chronically on daytime oxygen and nocturnal BIPAP. Admitted on 11/2 w/ 4 wk h/o gradually progressive dyspnea and O2 sats of 70% on 5 liters pulsed O2.   SIGNIFICANT EVENTS: 11/02 Admit to ICU  STUDIES:  LE [place] 11/3>>>negative for DVT.  Rt Baker's cyst present. VQ scan 11/3>>>Very low probability  LINES / TUBES: PIV  CULTURES: MRSA screen 11/02 >> POSITIVE  ANTIBIOTICS: augmentin 11/3>>>  SUBJECTIVE:  Feels better  VITAL SIGNS: Temp:  [97.9 F (36.6 C)-98.4 F (36.9 C)] 98.4 F (36.9 C) (11/05 0600) Pulse Rate:  [71-82] 71 (11/05 0600) Resp:  [18-20] 18 (11/05 0600) BP: (141-169)/(56-73) 156/73 mmHg (11/05 0600) SpO2:  [92 %-94 %] 92 % (11/05 0849) Weight:  [120.7 kg (266 lb 1.5 oz)] 120.7 kg (266 lb 1.5 oz) (11/05 0600) 5 -->4 liters -->3 liters  PHYSICAL EXAMINATION: General:  Obese aam, feeling better  Neuro:  Awake, alert and w/out focal def  HEENT:  . No jVD  Cardiovascular:  rrr Lungs: scattered rhonchi Abdomen:  Obese + bowel sounds  Musculoskeletal:  Intact  Skin:  Right > left LE edema   Labs: CBC Recent Labs     12/31/12  1740  01/01/13  0340  01/03/13  0530  WBC  13.3*  8.9  11.6*  HGB  12.9*  11.5*  12.0*  HCT  43.5  39.8  40.2  PLT  56*  52*  84*   BMET Recent Labs     12/31/12  1740  01/01/13  0340  01/03/13  0530  NA  142  140  139  K  4.0  4.7  5.3*  CL  94*  95*  93*  CO2  44*  42*  39*  BUN  47*  51*  63*  CREATININE  2.51*  2.80*  2.83*  GLUCOSE  209*  105*  333*    Electrolytes Recent Labs     12/31/12  1740  01/01/13   0340  01/03/13  0530  CALCIUM  10.6*  10.4  10.4   ABG Recent Labs     12/31/12  1725  12/31/12  2025  01/01/13  0933  PHART  7.244*  7.266*  7.367  PCO2ART  110.0*  98.3*  76.6*  PO2ART  56.6*  66.9*  59.1*    Liver Enzymes Recent Labs     12/31/12  1740  AST  17  ALT  15  ALKPHOS  69  BILITOT  0.4  ALBUMIN  3.9    Cardiac Enzymes Recent Labs     12/31/12  1740  PROBNP  952.7*    Glucose Recent Labs     01/01/13  2138  01/02/13  0749  01/02/13  1137  01/02/13  1717  01/02/13  2114  01/03/13  0752  GLUCAP  152*  226*  174*  216*  308*  264*    Imaging Nm Pulmonary Perf And Vent  01/01/2013   CLINICAL DATA:  Short of breath, elevated BUN and creatinine.  EXAM: NUCLEAR MEDICINE VENTILATION - PERFUSION LUNG SCAN  TECHNIQUE: Ventilation images were obtained in multiple projections using inhaled aerosol technetium 99 M DTPA. Perfusion images were obtained in multiple projections after intravenous injection of Tc-78m MAA.  COMPARISON:  None.  RADIOPHARMACEUTICALS:  40.3 mCi Tc-47m DTPA aerosol and 5.09 mCi Tc-50m MAA  FINDINGS: Ventilation: There is significant heterogeneity of ventilation without focal defect.  Perfusion: There is mild heterogeneity of perfusion which matches the heterogeneity of the ventilation. No wedge-shaped peripheral perfusion defects.  IMPRESSION: Very low probability for acute pulmonary embolism. Heterogeneous perfusion and ventilation consistent with COPD.   Electronically Signed   By: Genevive Bi M.D.   On: 01/01/2013 15:32      ASSESSMENT / PLAN:  A: Acute on Chronic respiratory failure (multi-factorial): in setting of decompensated OHS/OSA ,  cor pulmonale, and decompensated diastolic HF, super-imposed on underlying GOLD st D COPD +/- mild AECOPD and post-nasal gtt. .  >better w/ current rx focusing on rx for AECOPD AND diuresis  P: Continue nasal hygiene  Continue scheduled BDs Continue systemic steroids -->oral pred  11/5 Resume lasix Continue supplemental oxygen for sats >88% Have asked nursing staff to record walking oximetry, think he needs continuous, and not pulsed oxygen at home Continue nocturnal  BIPAP (NOT CPAP)  A: Chronic renal failure /CKD stage III Baseline scr in the 1.9-->2.1 range  Scr 2.8-->2.83, he is followed by nephrology  P: Close observation of chemistry Renal dose meds Resume home lasix, would like to see scr hold or improve over next 24hrs, if so can f/u w/ renal on his scheduled appointment next month w/ Dr. Darrick Penna   A: HTN, hyperlipidemia P: Continue zebeta, zocor Resume lasix   A: DM W/ hyperglycemia exacerbated by steroids.  P: SSI add back his bid NPH  Need to have the following prior to d/c -PT eval for safety and mobility, think he will need home health PT and possibly OT minimally. -need walking oximetry to determine O2 needs -would like to see scr stabilize another 24 hrs -would like to see a little better glycemic control  Updated family at bedside.  [name], MD [facility] Pulmonary/Critical Care 01/03/2013, 9:54 AM Pager:  [phone] After 3pm call: [phone]

## 2013-01-03 NOTE — Discharge Summary (Signed)
Physician Discharge Summary     Patient ID: AVERY KLINGBEIL MRN: [id number] DOB/AGE: [birth year] 72 y.o.  Admit date: 12/31/2012 Discharge date: 01/04/2013  Discharge Diagnoses:  Active Problems:   DIABETES MELLITUS, TYPE II   HYPERLIPIDEMIA   Morbid obesity   THROMBOCYTOPENIA   TOBACCO USER   DEPRESSION   HYPERTENSION   COPD GOLD III with reversibility   OSA (obstructive sleep apnea)   Cor pulmonale   Chronic renal failure   CHF (congestive heart failure)   Respiratory failure with hypercapnia   COPD exacerbation  Detailed Hospital Course:   This is a 72 year old male f/b [name] for chronic resp failure in the setting of Gold stage III/IV, class D COPD, OHS/OSA, and gd II diastolic dysfxn. Last seen in our office on 10/29 reporting ~4week h/o progressive dyspnea and decreased activity tol. At time of visit his sats were 70% on pulse 5 liters. He presented to the ER on 11/2 w/ progression of his dyspnea to the point that he was SOB at rest. He reports the dyspnea has been gradually progressive over the last 4 weeks, he reports chronic nasal congestion, chronic cough, mostly in the am and pm which is productive of clear sputum. Denies sick exposure, fever or chills. Denies CP. Does have LE swelling. Has had some light headedness from time to time. Was admitted by IM service. Treatment to date has included: NIPPV, diuresis, systemic steroids and BDs. He felt better after this. PCCM has been asked to see re: acute on chronic resp failure, as he is a patient in our clinic we assumed his care.   Further evaluation and treatment include addition of systemic steroids, nasal hygiene for PND, and empiric augmentin for possible sinusitis as a contributing factor. He continued to improve with these measures. For completeness we did get V/Q scan and dopplers to rule out possible VTE as contributing cause of worsening respiratory failure. These were negative.   He had improved close to baseline as of  11/5. At that point we focused the remainder of his care on being sure he was prepared for transition to home. This included the following: PT/OT consultation, walking oximetry which showed the following:  Patient Saturations on Room Air at Rest = 84%  Patient Saturations on 3 L while Ambulating = 78%  Patient Saturations on 6 Liters of oxygen while Ambulating = 88%  Please briefly explain why patient needs home oxygen: Patient requires 3 L 02 at rest. Oxygen saturation 78% on 3L while ambulating. Oxygen had to be increased to 6L to get o2 sats above 88%  Based on this we felt that a large contributing factor may have been simply inadequate oxygen at home as he did have sats in 70s on pulsed oxygen. Based on his O2 requirements we have set him up with oximizer for home w/ plan to use 3 liters at rest and increase to 6 liters w/ activity .In addition we made simple adjustments in his glycemic control, added back his routine medical regimen and prepared him for home. His care will be as outlined below.    Discharge Plan by diagnoses  Acute on Chronic respiratory failure (multi-factorial): in setting of decompensated OHS/OSA , cor pulmonale, and decompensated diastolic HF, super-imposed on underlying GOLD st D COPD +/- mild AECOPD and post-nasal gtt. .  >better w/ current rx focusing on rx for AECOPD AND diuresis  P:  Continue nasal hygiene  Home on regular BD regimen  Continue systemic steroids -->  oral pred 11/5, taper to off Home on 3 liters via oximizer w/ plan to increase to 6 liters w/ activity  Continue nocturnal BIPAP F/u our office 1 wk after d/c  Chronic renal failure /CKD stage III  Baseline scr in the 1.9-->2.1 range  Scr 2.8-->2.83, he is followed by nephrology  P:  Home on his regular lasix dosing Has f/u with nephrology in Dec We will check BMP next week, may need to adjust lasix  HTN, hyperlipidemia  P:  Continue zebeta, zocor and lasix   DM  W/ hyperglycemia exacerbated by  steroids.  P:  Home on his regular regimen Has f/u with PCP, he may need further evaluation for his glycemic control  Deconditioning P: Home with PT/OT  Significant Hospital tests/ studies/ interventions and procedures  STUDIES:  LE [place] 11/3>>>negative for DVT. Rt Baker's cyst present.  VQ scan 11/3>>>Very low probability  LINES / TUBES:  PIV  CULTURES:  MRSA screen 11/02 >> POSITIVE  ANTIBIOTICS:  augmentin 11/3>>>   Discharge Exam: BP 140/54  Pulse 71  Temp(Src) 98.4 F (36.9 C) (Oral)  Resp 22  Ht 5\' 8"  (1.727 m)  Wt 120.1 kg (264 lb 12.4 oz)  BMI 40.27 kg/m2  SpO2 97% 3 liters  PHYSICAL EXAMINATION:  General: Obese aam, feeling better  Neuro: Awake, alert and w/out focal def  HEENT: [place]. No jVD  Cardiovascular: rrr  Lungs: scattered rhonchi  Abdomen: Obese + bowel sounds  Musculoskeletal: Intact  Skin: Right > left LE edema    Labs at discharge Lab Results  Component Value Date   CREATININE 2.82* 01/04/2013   BUN 59* 01/04/2013   NA 143 01/04/2013   K 4.7 01/04/2013   CL 95* 01/04/2013   CO2 38* 01/04/2013   Lab Results  Component Value Date   WBC 11.6* 01/03/2013   HGB 12.0* 01/03/2013   HCT 40.2 01/03/2013   MCV 95.3 01/03/2013   PLT 84* 01/03/2013   Lab Results  Component Value Date   ALT 15 12/31/2012   AST 17 12/31/2012   ALKPHOS 69 12/31/2012   BILITOT 0.4 12/31/2012   Lab Results  Component Value Date   INR 1.01 06/19/2012   INR 1.06 04/22/2011   INR 1.10 04/16/2011    Current radiology studies No results found.  Disposition:  03-Skilled Nursing Facility      Discharge Orders   Future Appointments Provider Department Dept Phone   01/10/2013 11:45 AM Julio Sicks, NP [facility] [phone]   01/30/2013 2:00 PM Julio Sicks, NP [facility] [phone]   Future Orders Complete By Expires   Diet - low sodium heart healthy  As directed    Discharge instructions  As directed    Comments:     Wear oxygen at all  times 3 liters at rest Increase to 6 liters with activity Resume your BIPAP   Increase activity slowly  As directed        Medication List         acetaminophen 500 MG tablet  Commonly known as:  TYLENOL  Take 500 mg by mouth every 6 (six) hours as needed for pain ("per bottle").     albuterol (2.5 MG/3ML) 0.083% nebulizer solution  Commonly known as:  PROVENTIL  Take 6 mLs (5 mg total) by nebulization 4 (four) times daily. Dx 496     amoxicillin-clavulanate 875-125 MG per tablet  Commonly known as:  AUGMENTIN  Take 1 tablet by mouth 2 (two)  times daily.     bisoprolol 5 MG tablet  Commonly known as:  ZEBETA  TAKE 1 TABLET BY MOUTH TWICE DAILY     budesonide 0.25 MG/2ML nebulizer solution  Commonly known as:  PULMICORT  Take 2 mLs (0.25 mg total) by nebulization 4 (four) times daily.     calcitRIOL 0.25 MCG capsule  Commonly known as:  ROCALTROL  Take 1 capsule (0.25 mcg total) by mouth daily.     fluticasone 50 MCG/ACT nasal spray  Commonly known as:  FLONASE  Place 2 sprays into both nostrils daily.     furosemide 80 MG tablet  Commonly known as:  LASIX  Take 1 tablet (80 mg total) by mouth 2 (two) times daily.     HUMULIN N 100 UNIT/ML injection  Generic drug:  insulin NPH  Inject 20-22 Units into the skin 2 (two) times daily. Take 20 units in the morning and 22 units at night     insulin aspart 100 UNIT/ML injection  Commonly known as:  novoLOG  Inject 20 Units into the skin 3 (three) times daily with meals.     ipratropium 0.02 % nebulizer solution  Commonly known as:  ATROVENT  Take 2.5 mLs (0.5 mg total) by nebulization 4 (four) times daily. DX: 496     pantoprazole 40 MG tablet  Commonly known as:  PROTONIX  Take 30-60 min before first meal of the day     predniSONE 10 MG tablet  Commonly known as:  DELTASONE  Take 4 tabs  daily with food x 4 days, then 3 tabs daily x 4 days, then 2 tabs daily x 4 days, then 1 tab daily x4 days then stop. #40      simvastatin 40 MG tablet  Commonly known as:  ZOCOR  TAKE 1 TABLET BY MOUTH EVERY EVENING       Follow-up Information   Follow up with [facility], MD On 01/17/2013. (315pm)    Specialty:  Internal Medicine   Contact information:   [address] [place] [address] [phone]       Follow up with PARRETT,TAMMY, NP On 01/10/2013. (1145)    Specialty:  Nurse Practitioner   Contact information:   [address] [place] [address] [phone]       Follow up with [facility] On 01/08/2013. (report to lab between 730am-530pm basement at [place] )    Contact information:   [address] [place] [address]       Discharged Condition: good  Physician Statement:   The Patient was personally examined, the discharge assessment and plan has been personally reviewed and I agree with ACNP Babcock's assessment and plan. > 30 minutes of time have been dedicated to discharge assessment, planning and discharge instructions.   SignedAnders Simmonds 01/04/2013, 9:38 AM  [name], MD [facility] Pulmonary/Critical Care 01/04/2013, 9:43 AM Pager:  [phone] After 3pm call: [phone]

## 2013-01-03 NOTE — Evaluation (Signed)
Physical Therapy Evaluation Patient Details Name: [name] [name] MRN: [id number] DOB: [birth year] Today's Date: 01/03/2013 Time: 7829-5621 PT Time Calculation (min): 20 min  PT Assessment / Plan / Recommendation History of Present Illness  This is a 72 year old male f/b [name] w/ known h/o chronic resp failure on basis of GOLD III/IV class D COPD, OHS/OSA, and gd II diastolic dysfxn, chronically on daytime oxygen and nocturnal BIPAP. Admitted on 11/2 w/ 4 wk h/o gradually progressive dyspnea and O2 sats of 70% on 5 liters pulsed O2.    Clinical Impression  Pt will benefit from HHPT to address deficits below; Pt and dtr discussing tub DME; Have advised pt that he would likely  benefit from handicapped rail in shower as he has been pulling on soap dish for sometime; Pt cautioned that this is not safe;    PT Assessment  Patient needs continued PT services    Follow Up Recommendations  Home health PT;Supervision - Intermittent    Does the patient have the potential to tolerate intense rehabilitation      Barriers to Discharge        Equipment Recommendations   (pt and dtr discussing tub seat; )    Recommendations for Other Services     Frequency Min 3X/week    Precautions / Restrictions Precautions Precautions: Fall   Pertinent Vitals/Pain  sats 83% on 3L while amb; recovered to 95-97% with 20sec rest and pursed lip breathing;  Pt wears O2 at home but does not check his sats per his report     Mobility  Bed Mobility Bed Mobility: Supine to Sit Supine to Sit: 5: Supervision Details for Bed Mobility Assistance: supervision for safety Transfers Transfers: Sit to Stand;Stand to Sit Sit to Stand: 5: Supervision Stand to Sit: 5: Supervision Details for Transfer Assistance: cues for hand  placement and RW safety Ambulation/Gait Ambulation/Gait Assistance: 4: Min guard Ambulation Distance (Feet): 120 Feet (with standing rest to recover O2 sats) Assistive device: Rolling  walker Ambulation/Gait Assistance Details: cues for breathing and rest  Gait Pattern: Step-through pattern    Exercises     PT Diagnosis: Difficulty walking  PT Problem List: Decreased activity tolerance;Decreased mobility PT Treatment Interventions: Gait training;Functional mobility training;Therapeutic activities;Therapeutic exercise;Patient/family education;DME instruction     PT Goals(Current goals can be found in the care plan section) Acute Rehab PT Goals Patient Stated Goal: home PT Goal Formulation: With patient Time For Goal Achievement: 01/10/13 Potential to Achieve Goals: Good  Visit Information  Last PT Received On: 01/03/13 Assistance Needed: +1 History of Present Illness: This is a 72 year old male f/b [name] w/ known h/o chronic resp failure on basis of GOLD III/IV class D COPD, OHS/OSA, and gd II diastolic dysfxn, chronically on daytime oxygen and nocturnal BIPAP. Admitted on 11/2 w/ 4 wk h/o gradually progressive dyspnea and O2 sats of 70% on 5 liters pulsed O2.         Prior Functioning  Home Living Family/patient expects to be discharged to:: Private residence Living Arrangements: Alone Type of Home: House Home Access: Level entry Home Layout: One level Home Equipment: Walker - 2 wheels Additional Comments: on 2.5L of O2 24/7; usses RW when going out Prior Function Level of Independence: Independent;Independent with assistive device(s) Comments: Pt reports no RW use in house but uses if outside of house. Communication Communication: No difficulties    Cognition  Cognition Arousal/Alertness: Awake/alert Behavior During Therapy: WFL for tasks assessed/performed Overall Cognitive Status: Within Functional  Limits for tasks assessed    Extremity/Trunk Assessment Upper Extremity Assessment Upper Extremity Assessment: Overall WFL for tasks assessed Lower Extremity Assessment Lower Extremity Assessment: Overall WFL for tasks assessed   Balance    End of  Session PT - End of Session Equipment Utilized During Treatment: Oxygen Activity Tolerance: Patient tolerated treatment well Patient left: Other (comment);with call bell/phone within reach (EOB) Nurse Communication: Mobility status  GP     [facility] 01/03/2013, 2:06 PM

## 2013-01-04 LAB — BASIC METABOLIC PANEL
BUN: 59 mg/dL — ABNORMAL HIGH (ref 6–23)
Calcium: 10.1 mg/dL (ref 8.4–10.5)
Creatinine, Ser: 2.82 mg/dL — ABNORMAL HIGH (ref 0.50–1.35)
GFR calc Af Amer: 24 mL/min — ABNORMAL LOW (ref >90)
GFR calc non Af Amer: 21 mL/min — ABNORMAL LOW (ref >90)
Glucose, Bld: 285 mg/dL — ABNORMAL HIGH (ref 70–99)
Sodium: 143 mEq/L (ref 135–145)

## 2013-01-04 LAB — GLUCOSE, CAPILLARY
Glucose-Capillary: 270 mg/dL — ABNORMAL HIGH (ref 70–99)
Glucose-Capillary: 212 mg/dL — ABNORMAL HIGH (ref 70–99)

## 2013-01-04 MED ORDER — FLUTICASONE PROPIONATE 50 MCG/ACT NA SUSP: 2.0000 | Freq: Every day | NASAL | Status: DC

## 2013-01-04 MED ORDER — PREDNISONE 10 MG PO TABS: ORAL_TABLET | ORAL | Status: DC

## 2013-01-04 MED ORDER — ALBUTEROL SULFATE (2.5 MG/3ML) 0.083% IN NEBU: 5.0000 mg | INHALATION_SOLUTION | Freq: Four times a day (QID) | RESPIRATORY_TRACT | Status: DC

## 2013-01-04 MED ORDER — FUROSEMIDE 80 MG PO TABS: 80.0000 mg | ORAL_TABLET | Freq: Two times a day (BID) | ORAL | Status: DC

## 2013-01-04 MED ORDER — AMOXICILLIN-POT CLAVULANATE 875-125 MG PO TABS: 1.0000 | ORAL_TABLET | Freq: Two times a day (BID) | ORAL | Status: DC

## 2013-01-04 NOTE — Evaluation (Signed)
Occupational Therapy Evaluation Patient Details Name: GREG CRATTY MRN: [id number] DOB: May 23, 1940 Today's Date: 01/04/2013 Time: 4098-1191 OT Time Calculation (min): 23 min  OT Assessment / Plan / Recommendation History of present illness This is a 72 year old male f/b [name] w/ known h/o chronic resp failure on basis of GOLD III/IV class D COPD, OHS/OSA, and gd II diastolic dysfxn, chronically on daytime oxygen and nocturnal BIPAP. Admitted on 11/2 w/ 4 wk h/o gradually progressive dyspnea and O2 sats of 70% on 5 liters pulsed O2.     Clinical Impression   Pt is supposed to d/c home today. Discussed at length energy conservation techniques and practiced ADL. Will benefit from continued OT services to increase independence with self care tasks and reinforce education.    OT Assessment  Patient needs continued OT Services    Follow Up Recommendations  Home health OT;Other (comment) (HH aide)    Barriers to Discharge      Equipment Recommendations  None recommended by OT    Recommendations for Other Services    Frequency  Min 2X/week    Precautions / Restrictions Precautions Precautions: Fall Precaution Comments: monitor O2 Restrictions Weight Bearing Restrictions: No   Pertinent Vitals/Pain 92% on 4L with tank with activity Replaced on 3L; pt able to recover with rest and PLB.    ADL  Eating/Feeding: Simulated;Independent Where Assessed - Eating/Feeding: Edge of bed Grooming: Simulated;Wash/dry hands;Set up Where Assessed - Grooming: Supported sitting Upper Body Bathing: Simulated;Chest;Right arm;Left arm;Abdomen;Set up Where Assessed - Upper Body Bathing: Unsupported sitting Lower Body Bathing: Simulated;Min guard Where Assessed - Lower Body Bathing: Supported sit to stand Upper Body Dressing: Simulated;Set up Where Assessed - Upper Body Dressing: Unsupported sitting Lower Body Dressing: Simulated;Min guard Where Assessed - Lower Body Dressing: Supported sit to  stand Toilet Transfer: Performed;Min Pension scheme manager Method:  (pt with decreased awareness of safety with o2 tubing) Toilet Transfer Equipment: Comfort height toilet;Grab bars Toileting - Clothing Manipulation and Hygiene: Performed;Min guard Where Assessed - Engineer, mining and Hygiene: Sit to stand from 3-in-1 or toilet ADL Comments: Pt hasnt sponged bathed in a few weeks but when he did he had a person that came and helped with it. Explained it would be beneficial for pt to start out sponge bathing and work toward getting into shower as pt gets stronger/more energy. Also recommended pt let HHOT assess tub transfer before he does on his own. Pt agreeable. He did well with grab bar use for toilet transfer and has vanity next to toilet at home. Discussed energy conservation techniques in depth and how to pace activity/rest breaks/PLB. Pt agreable to [facility]. Pt trying to step over O2 tubing with it getting tangled at his feet. Needed assist to manage tubing safely with manuevering around the room without device but no physical assist for balance.     OT Diagnosis: Generalized weakness  OT Problem List: Decreased strength;Decreased knowledge of use of DME or AE OT Treatment Interventions: Self-care/ADL training;DME and/or AE instruction;Therapeutic activities;Patient/family education   OT Goals(Current goals can be found in the care plan section) Acute Rehab OT Goals Patient Stated Goal: home OT Goal Formulation: With patient Time For Goal Achievement: 01/18/13 Potential to Achieve Goals: Good  Visit Information  Last OT Received On: 01/04/13 Assistance Needed: +1 History of Present Illness: This is a 72 year old male f/b [name] w/ known h/o chronic resp failure on basis of GOLD III/IV class D COPD, OHS/OSA, and gd II diastolic  dysfxn, chronically on daytime oxygen and nocturnal BIPAP. Admitted on 11/2 w/ 4 wk h/o gradually progressive dyspnea and O2 sats of 70% on 5 liters  pulsed O2.         Prior Functioning     Home Living Family/patient expects to be discharged to:: Private residence Living Arrangements: Alone Type of Home: House Home Access: Level entry Home Layout: One level Home Equipment: Environmental consultant - 2 wheels;Shower seat;Bedside commode Additional Comments: on 2.5L of O2 24/7; usses RW when going out Prior Function Level of Independence: Needs assistance ADL's / Homemaking Assistance Needed: sister helps with some household tasks. Pt has been sponge bathing due to feeling more weak the last few weeks. Comments: Pt reports no RW use in house but uses if outside of house. Communication Communication: No difficulties         Vision/Perception     Cognition  Cognition Arousal/Alertness: Awake/alert Behavior During Therapy: WFL for tasks assessed/performed Overall Cognitive Status: Within Functional Limits for tasks assessed    Extremity/Trunk Assessment Upper Extremity Assessment Upper Extremity Assessment: Overall WFL for tasks assessed     Mobility Bed Mobility Supine to Sit: 5: Supervision;HOB elevated;With rails Details for Bed Mobility Assistance: increased time. pt states he sleeps in recliner. Transfers Transfers: Sit to Stand;Stand to Sit Sit to Stand: 4: Min guard;With upper extremity assist;From bed;From toilet Stand to Sit: 4: Min guard;With upper extremity assist;To bed;To toilet Details for Transfer Assistance: cues for hand placement.     Exercise     Balance Balance Balance Assessed: Yes Dynamic Standing Balance Dynamic Standing - Level of Assistance: 5: Stand by assistance   End of Session OT - End of Session Activity Tolerance: Patient tolerated treatment well Patient left: in bed;with call bell/phone within reach (EOB)  GO     Lennox Laity [phone] 01/04/2013, 12:32 PM

## 2013-01-04 NOTE — Progress Notes (Signed)
[facility]  [facility] is providing the following services: Patient has [employer] as his oxygen provider.  [facility] was asked to deliver an oximizer due to [place] not being able to provide one at this time.    If patient discharges after hours, please call [phone].   Renard Hamper 01/04/2013, 9:55 AM

## 2013-01-04 NOTE — Progress Notes (Signed)
Met with Mr [name] [name] at bedside to explain and offer [facility] Care Management services. He reports he lives alone and has home health already but is not sure the name of the agency. He is agreeable to [facility] Care Management services. Consents were signed. Made him aware that he will be called post hospital discharge and will be evaluated for monthly home visits. Explained that [facility] Care Management will not interfere with the services he already has in place. [facility] Care Management  will be an additional resource/support to help keep him healthy at home. Left [facility] Care Management packet at bedside. Appreciative of visit. Confirmed contact numbers. Raiford Noble, MSN-Ed, RN,BSN- [facility] Liaison [phone]

## 2013-01-04 NOTE — Progress Notes (Signed)
CARE MANAGEMENT NOTE 01/04/2013  Patient:  [name] [name], [name] [name]   Account Number:  [id number]  Date Initiated:  01/01/2013  Documentation initiated by:  DAVIS,RHONDA  Subjective/Objective Assessment:   pt with resp failure requring bipap for support.     Action/Plan:   home when table   Anticipated DC Date:  01/04/2013   Anticipated DC Plan:  HOME W HOME HEALTH SERVICES      DC Planning Services  CM consult      Choice offered to / List presented to:  C-1 Patient   DME arranged  OTHER - SEE COMMENT      DME agency  [facility]     HH arranged  HH-1 RN  HH-3 OT  HH-2 PT  HH-4 NURSE'S AIDE  HH-7 RESPIRATORY THERAPY      HH agency  [facility]   Status of service:  In process, will continue to follow Medicare Important Message given?  NA - LOS <3 / Initial given by admissions   Discharge Disposition:  HOME W HOME HEALTH SERVICES  Per UR Regulation:  Reviewed for med. necessity/level of care/duration of stay  Comments:  Algernon Huxley RN BSN [phone] Oximizer provided by [facility] since Christoper Allegra would not be able to set it up today. Pt had been active with [employer] for his oxygen since 1985, he was interested in switching to [facility] but it could not be done since he has been with another agency for over 18 months ( per [facility]).  Pt did not bring his portable tank with him, his daughter will go to his house to pick it up since it will take longer for [facility] to deliver one to the hospital.

## 2013-01-04 NOTE — Progress Notes (Signed)
Went over d/c instructions and meds with patient's daughter, Dellia Cloud, prior to d/c.  She verbalized understanding.  Also, gave her information regarding COPD Action Plan.  She said she would look over and let me know if she had any questions.

## 2013-01-04 NOTE — Progress Notes (Signed)
Provided patient and daughter with information and documents for HCPOA completion per their request. Explained process and they will proceed as they desire.  Reece Levy, MSW, Theresia Majors [phone]

## 2013-01-04 NOTE — Progress Notes (Addendum)
Patient evaluated for [facility] Care Management services. Currently working with therapy. Will come back to see patient. Made aware patient likely discharging today. Made inpatient RNCM aware that attempts being made to engage patient prior to discharge. Raiford Noble, MSN-Ed, RN,BSN- [facility] [phone]

## 2013-01-06 ENCOUNTER — Encounter (HOSPITAL_COMMUNITY): Payer: Self-pay | Admitting: Emergency Medicine

## 2013-01-06 ENCOUNTER — Inpatient Hospital Stay (HOSPITAL_COMMUNITY)
Admission: EM | Admit: 2013-01-06 | Discharge: 2013-01-11 | DRG: 189 | Disposition: A | Discharge status: Skilled Nursing Facility | Payer: Medicare Other | Attending: Internal Medicine | Admitting: Internal Medicine

## 2013-01-06 DIAGNOSIS — IMO0002 Reserved for concepts with insufficient information to code with codable children: Secondary | ICD-10-CM | POA: Diagnosis present

## 2013-01-06 DIAGNOSIS — Z860100 Personal history of colon polyps, unspecified: Secondary | ICD-10-CM

## 2013-01-06 DIAGNOSIS — Z87891 Personal history of nicotine dependence: Secondary | ICD-10-CM

## 2013-01-06 DIAGNOSIS — N179 Acute kidney failure, unspecified: Secondary | ICD-10-CM | POA: Diagnosis present

## 2013-01-06 DIAGNOSIS — Z8249 Family history of ischemic heart disease and other diseases of the circulatory system: Secondary | ICD-10-CM

## 2013-01-06 DIAGNOSIS — E1165 Type 2 diabetes mellitus with hyperglycemia: Secondary | ICD-10-CM | POA: Diagnosis present

## 2013-01-06 DIAGNOSIS — K219 Gastro-esophageal reflux disease without esophagitis: Secondary | ICD-10-CM | POA: Diagnosis present

## 2013-01-06 DIAGNOSIS — Z6841 Body Mass Index (BMI) 40.0 and over, adult: Secondary | ICD-10-CM

## 2013-01-06 DIAGNOSIS — D696 Thrombocytopenia, unspecified: Secondary | ICD-10-CM

## 2013-01-06 DIAGNOSIS — J449 Chronic obstructive pulmonary disease, unspecified: Secondary | ICD-10-CM

## 2013-01-06 DIAGNOSIS — F17200 Nicotine dependence, unspecified, uncomplicated: Secondary | ICD-10-CM

## 2013-01-06 DIAGNOSIS — I10 Essential (primary) hypertension: Secondary | ICD-10-CM | POA: Diagnosis present

## 2013-01-06 DIAGNOSIS — D649 Anemia, unspecified: Secondary | ICD-10-CM

## 2013-01-06 DIAGNOSIS — N184 Chronic kidney disease, stage 4 (severe): Secondary | ICD-10-CM | POA: Diagnosis present

## 2013-01-06 DIAGNOSIS — F329 Major depressive disorder, single episode, unspecified: Secondary | ICD-10-CM | POA: Diagnosis present

## 2013-01-06 DIAGNOSIS — G9341 Metabolic encephalopathy: Secondary | ICD-10-CM | POA: Diagnosis present

## 2013-01-06 DIAGNOSIS — I2781 Cor pulmonale (chronic): Secondary | ICD-10-CM | POA: Diagnosis present

## 2013-01-06 DIAGNOSIS — I5033 Acute on chronic diastolic (congestive) heart failure: Secondary | ICD-10-CM | POA: Diagnosis present

## 2013-01-06 DIAGNOSIS — T380X5A Adverse effect of glucocorticoids and synthetic analogues, initial encounter: Secondary | ICD-10-CM | POA: Diagnosis present

## 2013-01-06 DIAGNOSIS — J441 Chronic obstructive pulmonary disease with (acute) exacerbation: Secondary | ICD-10-CM | POA: Diagnosis present

## 2013-01-06 DIAGNOSIS — G9340 Encephalopathy, unspecified: Secondary | ICD-10-CM | POA: Diagnosis present

## 2013-01-06 DIAGNOSIS — F3289 Other specified depressive episodes: Secondary | ICD-10-CM

## 2013-01-06 DIAGNOSIS — M5137 Other intervertebral disc degeneration, lumbosacral region: Secondary | ICD-10-CM | POA: Diagnosis present

## 2013-01-06 DIAGNOSIS — Z79899 Other long term (current) drug therapy: Secondary | ICD-10-CM

## 2013-01-06 DIAGNOSIS — I129 Hypertensive chronic kidney disease with stage 1 through stage 4 chronic kidney disease, or unspecified chronic kidney disease: Secondary | ICD-10-CM | POA: Diagnosis present

## 2013-01-06 DIAGNOSIS — N189 Chronic kidney disease, unspecified: Secondary | ICD-10-CM

## 2013-01-06 DIAGNOSIS — I279 Pulmonary heart disease, unspecified: Secondary | ICD-10-CM | POA: Diagnosis present

## 2013-01-06 DIAGNOSIS — J9692 Respiratory failure, unspecified with hypercapnia: Secondary | ICD-10-CM | POA: Diagnosis present

## 2013-01-06 DIAGNOSIS — G4733 Obstructive sleep apnea (adult) (pediatric): Secondary | ICD-10-CM | POA: Diagnosis present

## 2013-01-06 DIAGNOSIS — E119 Type 2 diabetes mellitus without complications: Secondary | ICD-10-CM

## 2013-01-06 DIAGNOSIS — I509 Heart failure, unspecified: Secondary | ICD-10-CM

## 2013-01-06 DIAGNOSIS — J9620 Acute and chronic respiratory failure, unspecified whether with hypoxia or hypercapnia: Principal | ICD-10-CM

## 2013-01-06 DIAGNOSIS — E874 Mixed disorder of acid-base balance: Secondary | ICD-10-CM | POA: Diagnosis present

## 2013-01-06 DIAGNOSIS — J309 Allergic rhinitis, unspecified: Secondary | ICD-10-CM

## 2013-01-06 DIAGNOSIS — Z794 Long term (current) use of insulin: Secondary | ICD-10-CM

## 2013-01-06 DIAGNOSIS — N058 Unspecified nephritic syndrome with other morphologic changes: Secondary | ICD-10-CM | POA: Diagnosis present

## 2013-01-06 DIAGNOSIS — E662 Morbid (severe) obesity with alveolar hypoventilation: Secondary | ICD-10-CM | POA: Diagnosis present

## 2013-01-06 DIAGNOSIS — E1129 Type 2 diabetes mellitus with other diabetic kidney complication: Secondary | ICD-10-CM | POA: Diagnosis present

## 2013-01-06 DIAGNOSIS — M109 Gout, unspecified: Secondary | ICD-10-CM | POA: Diagnosis present

## 2013-01-06 DIAGNOSIS — Z951 Presence of aortocoronary bypass graft: Secondary | ICD-10-CM

## 2013-01-06 DIAGNOSIS — M51379 Other intervertebral disc degeneration, lumbosacral region without mention of lumbar back pain or lower extremity pain: Secondary | ICD-10-CM | POA: Diagnosis present

## 2013-01-06 DIAGNOSIS — G40909 Epilepsy, unspecified, not intractable, without status epilepticus: Secondary | ICD-10-CM | POA: Diagnosis present

## 2013-01-06 DIAGNOSIS — J9610 Chronic respiratory failure, unspecified whether with hypoxia or hypercapnia: Secondary | ICD-10-CM | POA: Diagnosis present

## 2013-01-06 DIAGNOSIS — E785 Hyperlipidemia, unspecified: Secondary | ICD-10-CM | POA: Diagnosis present

## 2013-01-06 MED ORDER — ALBUTEROL SULFATE (5 MG/ML) 0.5% IN NEBU
5.0000 mg | INHALATION_SOLUTION | Freq: Once | RESPIRATORY_TRACT | Status: AC
  Administered 2013-01-07: 5 mg via RESPIRATORY_TRACT
  Filled 2013-01-06: qty 1

## 2013-01-06 MED ORDER — IPRATROPIUM BROMIDE 0.02 % IN SOLN
0.5000 mg | Freq: Once | RESPIRATORY_TRACT | Status: AC
  Administered 2013-01-07: 0.5 mg via RESPIRATORY_TRACT
  Filled 2013-01-06: qty 2.5

## 2013-01-06 NOTE — ED Notes (Signed)
Bed: JY78 Expected date:  Expected time:  Means of arrival:  Comments: EMS 72yo M; confusion, alt mental status, dec urinary output

## 2013-01-06 NOTE — ED Notes (Signed)
Pt wife states he has been confused with decreased urine output. Upon arrival EMS noticed pt had bilateral wheezing and was given albuterol tx. Pre tx O2 95% post 99%.

## 2013-01-07 ENCOUNTER — Emergency Department (HOSPITAL_COMMUNITY): Payer: Medicare Other

## 2013-01-07 DIAGNOSIS — G4733 Obstructive sleep apnea (adult) (pediatric): Secondary | ICD-10-CM

## 2013-01-07 DIAGNOSIS — I10 Essential (primary) hypertension: Secondary | ICD-10-CM

## 2013-01-07 DIAGNOSIS — J441 Chronic obstructive pulmonary disease with (acute) exacerbation: Secondary | ICD-10-CM

## 2013-01-07 DIAGNOSIS — J9610 Chronic respiratory failure, unspecified whether with hypoxia or hypercapnia: Secondary | ICD-10-CM

## 2013-01-07 DIAGNOSIS — N189 Chronic kidney disease, unspecified: Secondary | ICD-10-CM

## 2013-01-07 DIAGNOSIS — E119 Type 2 diabetes mellitus without complications: Secondary | ICD-10-CM

## 2013-01-07 DIAGNOSIS — I509 Heart failure, unspecified: Secondary | ICD-10-CM

## 2013-01-07 DIAGNOSIS — G9340 Encephalopathy, unspecified: Secondary | ICD-10-CM

## 2013-01-07 DIAGNOSIS — J449 Chronic obstructive pulmonary disease, unspecified: Secondary | ICD-10-CM

## 2013-01-07 DIAGNOSIS — N058 Unspecified nephritic syndrome with other morphologic changes: Secondary | ICD-10-CM

## 2013-01-07 DIAGNOSIS — I279 Pulmonary heart disease, unspecified: Secondary | ICD-10-CM

## 2013-01-07 DIAGNOSIS — E1129 Type 2 diabetes mellitus with other diabetic kidney complication: Secondary | ICD-10-CM

## 2013-01-07 LAB — BASIC METABOLIC PANEL
BUN: 65 mg/dL — ABNORMAL HIGH (ref 6–23)
CO2: 38 mEq/L — ABNORMAL HIGH (ref 19–32)
Calcium: 9.8 mg/dL (ref 8.4–10.5)
Chloride: 97 mEq/L (ref 96–112)
Creatinine, Ser: 2.79 mg/dL — ABNORMAL HIGH (ref 0.50–1.35)
GFR calc Af Amer: 24 mL/min — ABNORMAL LOW (ref >90)
GFR calc non Af Amer: 21 mL/min — ABNORMAL LOW (ref >90)
Sodium: 141 mEq/L (ref 135–145)

## 2013-01-07 LAB — BLOOD GAS, ARTERIAL
Acid-Base Excess: 9.9 mmol/L — ABNORMAL HIGH (ref 0.0–2.0)
Bicarbonate: 41.4 mEq/L — ABNORMAL HIGH (ref 20.0–24.0)
Bicarbonate: 40.6 mEq/L — ABNORMAL HIGH (ref 20.0–24.0)
Drawn by: 235321
Expiratory PAP: 6
FIO2: 0.4 %
Mode: POSITIVE
O2 Content: 5 L/min
O2 Saturation: 94 %
Patient temperature: 98.6
Patient temperature: 98.6
Pressure control: 14 cmH2O
RATE: 15 resp/min
TCO2: 39.1 mmol/L (ref 0–100)
TCO2: 38.1 mmol/L (ref 0–100)
pCO2 arterial: 94.7 mmHg (ref 35.0–45.0)
pH, Arterial: 7.229 — ABNORMAL LOW (ref 7.350–7.450)
pH, Arterial: 7.255 — ABNORMAL LOW (ref 7.350–7.450)
pO2, Arterial: 76.5 mmHg — ABNORMAL LOW (ref 80.0–100.0)
pO2, Arterial: 54.4 mmHg — ABNORMAL LOW (ref 80.0–100.0)
pO2, Arterial: 62.2 mmHg — ABNORMAL LOW (ref 80.0–100.0)

## 2013-01-07 LAB — CBC WITH DIFFERENTIAL/PLATELET
Basophils Absolute: 0 10*3/uL (ref 0.0–0.1)
Eosinophils Absolute: 0.2 10*3/uL (ref 0.0–0.7)
Eosinophils Relative: 1 % (ref 0–5)
HCT: 44.3 % (ref 39.0–52.0)
Hemoglobin: 12.8 g/dL — ABNORMAL LOW (ref 13.0–17.0)
Lymphocytes Relative: 14 % (ref 12–46)
Lymphs Abs: 2.2 10*3/uL (ref 0.7–4.0)
MCHC: 28.9 g/dL — ABNORMAL LOW (ref 30.0–36.0)
Monocytes Absolute: 1.2 10*3/uL — ABNORMAL HIGH (ref 0.1–1.0)
Monocytes Relative: 8 % (ref 3–12)
Neutro Abs: 11.9 10*3/uL — ABNORMAL HIGH (ref 1.7–7.7)
Neutrophils Relative %: 77 % (ref 43–77)
Platelets: 79 10*3/uL — ABNORMAL LOW (ref 150–400)
RDW: 13.9 % (ref 11.5–15.5)
WBC: 15.5 10*3/uL — ABNORMAL HIGH (ref 4.0–10.5)

## 2013-01-07 LAB — URINE MICROSCOPIC-ADD ON: Urine-Other: NONE SEEN

## 2013-01-07 LAB — URINALYSIS, ROUTINE W REFLEX MICROSCOPIC
Bilirubin Urine: NEGATIVE
Glucose, UA: NEGATIVE mg/dL
Hgb urine dipstick: NEGATIVE
Protein, ur: 100 mg/dL — AB
Urobilinogen, UA: 0.2 mg/dL (ref 0.0–1.0)
pH: 5.5 (ref 5.0–8.0)

## 2013-01-07 LAB — POCT I-STAT, CHEM 8
BUN: 76 mg/dL — ABNORMAL HIGH (ref 6–23)
Calcium, Ion: 1.2 mmol/L (ref 1.13–1.30)
Chloride: 96 mEq/L (ref 96–112)
Creatinine, Ser: 3 mg/dL — ABNORMAL HIGH (ref 0.50–1.35)
Potassium: 5 mEq/L (ref 3.5–5.1)
Sodium: 143 mEq/L (ref 135–145)

## 2013-01-07 LAB — PRO B NATRIURETIC PEPTIDE: Pro B Natriuretic peptide (BNP): 488.7 pg/mL — ABNORMAL HIGH (ref 0–125)

## 2013-01-07 LAB — GLUCOSE, CAPILLARY
Glucose-Capillary: 258 mg/dL — ABNORMAL HIGH (ref 70–99)
Glucose-Capillary: 250 mg/dL — ABNORMAL HIGH (ref 70–99)

## 2013-01-07 LAB — CBC
HCT: 42.8 % (ref 39.0–52.0)
Hemoglobin: 12.3 g/dL — ABNORMAL LOW (ref 13.0–17.0)
MCHC: 28.7 g/dL — ABNORMAL LOW (ref 30.0–36.0)
MCV: 99.8 fL (ref 78.0–100.0)
Platelets: 59 10*3/uL — ABNORMAL LOW (ref 150–400)
RDW: 14 % (ref 11.5–15.5)
WBC: 18.8 10*3/uL — ABNORMAL HIGH (ref 4.0–10.5)

## 2013-01-07 LAB — POCT I-STAT TROPONIN I: Troponin i, poc: 0 ng/mL (ref 0.00–0.08)

## 2013-01-07 MED ORDER — SODIUM CHLORIDE 0.9 % IJ SOLN: 3.0000 mL | INTRAMUSCULAR | Status: DC | PRN

## 2013-01-07 MED ORDER — ACETAMINOPHEN 325 MG PO TABS: 650.0000 mg | ORAL_TABLET | Freq: Four times a day (QID) | ORAL | Status: DC | PRN

## 2013-01-07 MED ORDER — CHLORHEXIDINE GLUCONATE CLOTH 2 % EX PADS
6.0000 | MEDICATED_PAD | Freq: Every day | CUTANEOUS | Status: DC
  Administered 2013-01-08 – 2013-01-11 (×4): 6 via TOPICAL

## 2013-01-07 MED ORDER — ENOXAPARIN SODIUM 40 MG/0.4ML ~~LOC~~ SOLN
40.0000 mg | SUBCUTANEOUS | Status: DC
  Administered 2013-01-07 – 2013-01-08 (×2): 40 mg via SUBCUTANEOUS
  Filled 2013-01-07 (×2): qty 0.4

## 2013-01-07 MED ORDER — ALBUTEROL SULFATE (5 MG/ML) 0.5% IN NEBU
2.5000 mg | INHALATION_SOLUTION | RESPIRATORY_TRACT | Status: DC
  Administered 2013-01-07 – 2013-01-11 (×25): 2.5 mg via RESPIRATORY_TRACT
  Filled 2013-01-07 (×25): qty 0.5

## 2013-01-07 MED ORDER — METHYLPREDNISOLONE SODIUM SUCC 125 MG IJ SOLR: 80.0000 mg | Freq: Two times a day (BID) | INTRAMUSCULAR | Status: DC

## 2013-01-07 MED ORDER — METHYLPREDNISOLONE SODIUM SUCC 125 MG IJ SOLR
125.0000 mg | Freq: Four times a day (QID) | INTRAMUSCULAR | Status: DC
  Administered 2013-01-07: 125 mg via INTRAVENOUS
  Filled 2013-01-07: qty 2

## 2013-01-07 MED ORDER — METHYLPREDNISOLONE SODIUM SUCC 125 MG IJ SOLR
80.0000 mg | Freq: Four times a day (QID) | INTRAMUSCULAR | Status: DC
  Administered 2013-01-07 – 2013-01-09 (×7): 80 mg via INTRAVENOUS
  Filled 2013-01-07 (×12): qty 1.28

## 2013-01-07 MED ORDER — VANCOMYCIN HCL IN DEXTROSE 1-5 GM/200ML-% IV SOLN
1000.0000 mg | Freq: Once | INTRAVENOUS | Status: DC
  Filled 2013-01-07: qty 200

## 2013-01-07 MED ORDER — FUROSEMIDE 10 MG/ML IJ SOLN
40.0000 mg | Freq: Four times a day (QID) | INTRAMUSCULAR | Status: AC
  Administered 2013-01-07 (×2): 40 mg via INTRAVENOUS
  Filled 2013-01-07 (×3): qty 4

## 2013-01-07 MED ORDER — ALBUTEROL SULFATE (5 MG/ML) 0.5% IN NEBU
2.5000 mg | INHALATION_SOLUTION | RESPIRATORY_TRACT | Status: DC
  Administered 2013-01-07 (×2): 2.5 mg via RESPIRATORY_TRACT
  Filled 2013-01-07 (×2): qty 0.5

## 2013-01-07 MED ORDER — MUPIROCIN 2 % EX OINT
1.0000 "application " | TOPICAL_OINTMENT | Freq: Two times a day (BID) | CUTANEOUS | Status: DC
  Administered 2013-01-07 – 2013-01-11 (×9): 1 via NASAL
  Filled 2013-01-07: qty 22

## 2013-01-07 MED ORDER — PIPERACILLIN-TAZOBACTAM 3.375 G IVPB
3.3750 g | Freq: Once | INTRAVENOUS | Status: DC
  Filled 2013-01-07: qty 50

## 2013-01-07 MED ORDER — METHYLPREDNISOLONE SODIUM SUCC 125 MG IJ SOLR
125.0000 mg | Freq: Once | INTRAMUSCULAR | Status: AC
  Administered 2013-01-07: 125 mg via INTRAVENOUS
  Filled 2013-01-07: qty 2

## 2013-01-07 MED ORDER — ONDANSETRON HCL 4 MG PO TABS: 4.0000 mg | ORAL_TABLET | Freq: Four times a day (QID) | ORAL | Status: DC | PRN

## 2013-01-07 MED ORDER — SODIUM CHLORIDE 0.9 % IJ SOLN
3.0000 mL | Freq: Two times a day (BID) | INTRAMUSCULAR | Status: DC
  Administered 2013-01-07 – 2013-01-08 (×4): 3 mL via INTRAVENOUS

## 2013-01-07 MED ORDER — SODIUM CHLORIDE 0.9 % IV SOLN: 250.0000 mL | INTRAVENOUS | Status: DC | PRN

## 2013-01-07 MED ORDER — ALBUTEROL SULFATE (5 MG/ML) 0.5% IN NEBU: 5.0000 mg | INHALATION_SOLUTION | Freq: Once | RESPIRATORY_TRACT | Status: DC

## 2013-01-07 MED ORDER — CHLORHEXIDINE GLUCONATE 0.12 % MT SOLN
15.0000 mL | Freq: Two times a day (BID) | OROMUCOSAL | Status: DC
  Administered 2013-01-07 – 2013-01-11 (×8): 15 mL via OROMUCOSAL
  Filled 2013-01-07 (×10): qty 15

## 2013-01-07 MED ORDER — ALUM & MAG HYDROXIDE-SIMETH 200-200-20 MG/5ML PO SUSP: 30.0000 mL | Freq: Four times a day (QID) | ORAL | Status: DC | PRN

## 2013-01-07 MED ORDER — IPRATROPIUM BROMIDE 0.02 % IN SOLN
0.5000 mg | RESPIRATORY_TRACT | Status: DC
  Administered 2013-01-07 (×2): 0.5 mg via RESPIRATORY_TRACT
  Filled 2013-01-07 (×2): qty 2.5

## 2013-01-07 MED ORDER — BIOTENE DRY MOUTH MT LIQD
15.0000 mL | Freq: Two times a day (BID) | OROMUCOSAL | Status: DC
  Administered 2013-01-07 – 2013-01-11 (×9): 15 mL via OROMUCOSAL

## 2013-01-07 MED ORDER — METHYLPREDNISOLONE SODIUM SUCC 125 MG IJ SOLR
125.0000 mg | Freq: Two times a day (BID) | INTRAMUSCULAR | Status: DC
  Filled 2013-01-07: qty 2

## 2013-01-07 MED ORDER — HYDROMORPHONE HCL PF 1 MG/ML IJ SOLN: 0.5000 mg | INTRAMUSCULAR | Status: DC | PRN

## 2013-01-07 MED ORDER — BISOPROLOL FUMARATE 5 MG PO TABS
5.0000 mg | ORAL_TABLET | Freq: Every day | ORAL | Status: DC
  Administered 2013-01-07 – 2013-01-11 (×5): 5 mg via ORAL
  Filled 2013-01-07 (×5): qty 1

## 2013-01-07 MED ORDER — CHLORHEXIDINE GLUCONATE CLOTH 2 % EX PADS: 6.0000 | MEDICATED_PAD | Freq: Every day | CUTANEOUS | Status: DC

## 2013-01-07 MED ORDER — METHYLPREDNISOLONE SODIUM SUCC 125 MG IJ SOLR
80.0000 mg | Freq: Four times a day (QID) | INTRAMUSCULAR | Status: DC
  Filled 2013-01-07 (×3): qty 1.28

## 2013-01-07 MED ORDER — ONDANSETRON HCL 4 MG/2ML IJ SOLN: 4.0000 mg | Freq: Four times a day (QID) | INTRAMUSCULAR | Status: DC | PRN

## 2013-01-07 MED ORDER — IPRATROPIUM BROMIDE 0.02 % IN SOLN
0.5000 mg | Freq: Four times a day (QID) | RESPIRATORY_TRACT | Status: DC
  Administered 2013-01-07 – 2013-01-11 (×17): 0.5 mg via RESPIRATORY_TRACT
  Filled 2013-01-07 (×17): qty 2.5

## 2013-01-07 MED ORDER — ACETAMINOPHEN 650 MG RE SUPP: 650.0000 mg | Freq: Four times a day (QID) | RECTAL | Status: DC | PRN

## 2013-01-07 MED ORDER — ZOLPIDEM TARTRATE 5 MG PO TABS: 5.0000 mg | ORAL_TABLET | Freq: Every evening | ORAL | Status: DC | PRN

## 2013-01-07 MED ORDER — OXYCODONE HCL 5 MG PO TABS: 5.0000 mg | ORAL_TABLET | ORAL | Status: DC | PRN

## 2013-01-07 MED ORDER — INSULIN ASPART 100 UNIT/ML ~~LOC~~ SOLN
0.0000 [IU] | SUBCUTANEOUS | Status: DC
  Administered 2013-01-07: 3 [IU] via SUBCUTANEOUS
  Administered 2013-01-07: 5 [IU] via SUBCUTANEOUS
  Administered 2013-01-07: 3 [IU] via SUBCUTANEOUS
  Administered 2013-01-07: 5 [IU] via SUBCUTANEOUS
  Administered 2013-01-07 – 2013-01-08 (×3): 3 [IU] via SUBCUTANEOUS
  Administered 2013-01-08: 7 [IU] via SUBCUTANEOUS
  Administered 2013-01-08 (×2): 3 [IU] via SUBCUTANEOUS

## 2013-01-07 NOTE — ED Notes (Signed)
Nurses attempting to start IVs

## 2013-01-07 NOTE — Plan of Care (Signed)
Problem: ICU Phase Progression Outcomes Goal: Dyspnea controlled at rest Outcome: Progressing Requiring BPIP at this time. Goal: Hemodynamically stable Outcome: Not Progressing Still requiring bipap.  Decreased LOC.

## 2013-01-07 NOTE — Progress Notes (Signed)
PHARMACY - LOVENOX  Lovenox 30mg  sq q24h ordered for VTE prophylaxis.  Pharmacy asked to adjust dose if needed.  72 yr male with TBW = 120.1 kg and BMI = 40.  CrCl ~ 27 ml/min  Although CrCl < 88ml/min, due to BMI > 30 will adjust Lovenox dose.  PLAN:  Change Lovenox to 40mg  sq q24h for BMI > 30 with CrCl < 30 ml/min  Thank you, Terrilee Files, PharmD

## 2013-01-07 NOTE — Progress Notes (Signed)
Pt has on a Large Bipap mask. Pt is getting volumes in the 700s with a leak of 56.  He would benefit from having a larger mask because it would fit more properly, which was suggested by the md. Neither [facility] or [facility] have XL mask in our stock. [facility] about getting a mask for the [facility]. [facility] stated it would be tomorrow before they would be able to check with retail stores about this matter.  We should receive a follow up called from [facility] in the am.

## 2013-01-07 NOTE — ED Notes (Signed)
Report called waiting on resp for pt transport.

## 2013-01-07 NOTE — ED Provider Notes (Signed)
CSN: [id number]     Arrival date & time 01/06/13  2348 History   First MD Initiated Contact with Patient 01/06/13 2351     Chief Complaint  Patient presents with  . Wheezing  . Altered Mental Status   (Consider location/radiation/quality/duration/timing/severity/associated sxs/prior Treatment) Patient is a 72 y.o. male presenting with wheezing and altered mental status. The history is provided by the EMS personnel. The history is limited by the condition of the patient.  Wheezing Severity:  Unable to specify Severity compared to prior episodes:  Unable to specify Onset quality:  Unable to specify Timing:  Constant Progression:  Improving Chronicity:  Recurrent Relieved by:  Nebulizer treatments Worsened by:  Nothing tried Ineffective treatments:  None tried Risk factors: prior hospitalizations   Altered Mental Status Presenting symptoms: confusion and memory loss   Severity:  Moderate Most recent episode:  Today Episode history:  Single Timing:  Constant Progression:  Unable to specify Context: recent illness   Context: not dementia   Associated symptoms comment:  Decreased UOP   Past Medical History  Diagnosis Date  . Renal insufficiency   . Diabetes mellitus   . GERD (gastroesophageal reflux disease)   . Gastroparesis   . Esophageal stricture   . Hyperlipemia   . Hypertension   . Pancreatitis   . Chronic respiratory disease   . Depression   . Seizure disorder   . Arthritis   . Diverticulosis   . Sleep apnea     CPAP Machine  . Obesity   . ED (erectile dysfunction)   . Sleep apnea   . COPD (chronic obstructive pulmonary disease)     Gold Stage  III/IV  . H/O polycythemia   . Chronic kidney disease (CKD), stage III (moderate)   . DDD (degenerative disc disease), lumbar   . History of esophageal stricture   . Erectile dysfunction   . Peptic ulcer disease   . History of colon polyps   . Gout   . Gastroparesis   . Acute encephalopathy 06/20/2012  .  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 CHF (congestive heart failure) 05/19/2012  . DIVERTICULOSIS, COLON 08/05/2009    Qualifier: Diagnosis of  By: Koleen Distance CMA (AAMA), Hulan Saas    . Obesity hypoventilation syndrome 04/28/2011  . OSA (obstructive sleep apnea) 09/22/2006    Auto 2013:  Optimal pressure 13/10   . SEIZURE DISORDER 09/22/2006    Qualifier: Diagnosis of  By: Nena Jordan   . THROMBOCYTOPENIA 04/17/2007    Chronic, Qualifier: Diagnosis of  By: Thereasa Solo     Past Surgical History  Procedure Laterality Date  . Prostate surgery    . Coronary artery bypass graft      x 4  . Bone marrow biopsy    . Cardiac catheterization  05/02/2008  . Back surgery      lumbar   Family History  Problem Relation Age of Onset  . Colon cancer Neg Hx   . Cancer Brother     unsure what kind  . Heart disease Mother   . Heart disease Father   . Emphysema Father    History  Substance Use Topics  . Smoking status: Former Smoker -- 1.00 packs/day for 60 years    Types: Cigarettes    Quit date: 05/12/2012  . Smokeless tobacco: Never Used  . Alcohol Use: No     Comment: former heavy drinker  Review of Systems  Unable to perform ROS Respiratory: Positive for wheezing.   Psychiatric/Behavioral: Positive for memory loss and confusion.    Allergies  Review of patient's allergies indicates no known allergies.  Home Medications   Current Outpatient Rx  Name  Route  Sig  Dispense  Refill  . acetaminophen (TYLENOL) 500 MG tablet   Oral   Take 500 mg by mouth every 6 (six) hours as needed for pain ("per bottle").         . albuterol (PROVENTIL) (2.5 MG/3ML) 0.083% nebulizer solution   Nebulization   Take 6 mLs (5 mg total) by nebulization 4 (four) times daily. Dx 496   75 mL   12   . amoxicillin-clavulanate (AUGMENTIN) 875-125 MG per tablet   Oral   Take 1 tablet by mouth 2 (two) times daily.   10 tablet    0   . bisoprolol (ZEBETA) 5 MG tablet      TAKE 1 TABLET BY MOUTH TWICE DAILY   60 tablet   3   . budesonide (PULMICORT) 0.25 MG/2ML nebulizer solution   Nebulization   Take 2 mLs (0.25 mg total) by nebulization 4 (four) times daily.   60 mL   6   . calcitRIOL (ROCALTROL) 0.25 MCG capsule   Oral   Take 1 capsule (0.25 mcg total) by mouth daily.   30 capsule   4   . fluticasone (FLONASE) 50 MCG/ACT nasal spray   Each Nare   Place 2 sprays into both nostrils daily.   16 g   6   . furosemide (LASIX) 80 MG tablet   Oral   Take 1 tablet (80 mg total) by mouth 2 (two) times daily.   60 tablet   6   . insulin aspart (NOVOLOG) 100 UNIT/ML injection   Subcutaneous   Inject 20 Units into the skin 3 (three) times daily with meals.         . insulin NPH (HUMULIN N) 100 UNIT/ML injection   Subcutaneous   Inject 20-22 Units into the skin 2 (two) times daily. Take 20 units in the morning and 22 units at night         . ipratropium (ATROVENT) 0.02 % nebulizer solution   Nebulization   Take 2.5 mLs (0.5 mg total) by nebulization 4 (four) times daily. DX: 496   300 mL   5     496   . pantoprazole (PROTONIX) 40 MG tablet      Take 30-60 min before first meal of the day   30 tablet   4   . predniSONE (DELTASONE) 10 MG tablet      Take 4 tabs  daily with food x 4 days, then 3 tabs daily x 4 days, then 2 tabs daily x 4 days, then 1 tab daily x4 days then stop. #40   40 tablet   0   . simvastatin (ZOCOR) 40 MG tablet      TAKE 1 TABLET BY MOUTH EVERY EVENING   30 tablet   3    BP 149/62  Pulse 98  Temp(Src) 98.5 F (36.9 C) (Oral)  Resp 36  SpO2 91% Physical Exam  Constitutional: He appears well-developed and well-nourished.  HENT:  Head: Normocephalic and atraumatic.  Mouth/Throat: Oropharynx is clear and moist.  Eyes: Pupils are equal, round, and reactive to light.  Neck: Normal range of motion. Neck supple.  Cardiovascular: Normal rate and regular  rhythm.   Pulmonary/Chest: He has  decreased breath sounds. He has rhonchi.  Abdominal: Soft. Bowel sounds are normal. There is no tenderness. There is no rebound and no guarding.  Musculoskeletal: Normal range of motion. He exhibits edema.  Neurological: He is alert.  Skin: Skin is warm and dry. He is not diaphoretic.  Psychiatric: He has a normal mood and affect.    ED Course  Procedures (including critical care time) Labs Review Labs Reviewed  CBC WITH DIFFERENTIAL  BLOOD GAS, ARTERIAL  URINALYSIS, ROUTINE W REFLEX MICROSCOPIC  PRO B NATRIURETIC PEPTIDE   Imaging Review No results found.  EKG Interpretation   None       MDM  No diagnosis found.  Date: 01/07/2013  Rate: 96  Rhythm: normal sinus rhythm  QRS Axis: normal  Intervals: normal  ST/T Wave abnormalities: normal  Conduction Disutrbances:none  Narrative Interpretation:   Old EKG Reviewed: none available   MDM Reviewed: previous chart, nursing note and vitals Reviewed previous: labs and x-ray Interpretation: labs, ECG and x-ray (CO2 111 severe hypercarbia compensated) Total time providing critical care: 30-74 minutes. This excludes time spent performing separately reportable procedures and services. Consults: admitting MD   Medications  vancomycin (VANCOCIN) IVPB 1000 mg/200 mL premix (not administered)  piperacillin-tazobactam (ZOSYN) IVPB 3.375 g (not administered)  methylPREDNISolone sodium succinate (SOLU-MEDROL) 125 mg/2 mL injection 125 mg (not administered)  albuterol (PROVENTIL) (5 MG/ML) 0.5% nebulizer solution 5 mg (not administered)  albuterol (PROVENTIL) (5 MG/ML) 0.5% nebulizer solution 5 mg (5 mg Nebulization Given 01/07/13 0017)  ipratropium (ATROVENT) nebulizer solution 0.5 mg (0.5 mg Nebulization Given 01/07/13 0017)   CRITICAL CARE Performed by: Jasmine Awe Total critical care time: 60 minutes Critical care time was exclusive of separately billable procedures and treating  other patients. Critical care was necessary to treat or prevent imminent or life-threatening deterioration. Critical care was time spent personally by me on the following activities: development of treatment plan with patient and/or surrogate as well as nursing, discussions with consultants, evaluation of patient's response to treatment, examination of patient, obtaining history from patient or surrogate, ordering and performing treatments and interventions, ordering and review of laboratory studies, ordering and review of radiographic studies, pulse oximetry and re-evaluation of patient's condition.    Jasmine Awe, MD 01/07/13 [phone]

## 2013-01-07 NOTE — H&P (Addendum)
[facility] History and Physical  [name]:[id number] DOB: 03-20-1940 DOA: 01/06/2013  Referring physician: EDP PCP: Georgianne Fick, MD  Specialists:   Chief Complaint: Confusion  HPI: [name] [name] is a 72 y.o. male with COPD, OSA, and CHF who was brought to the ED due to worsening confusion over the past 2 days.    His sister is at the bedside and gives the history and observed that his confusion started 2 days ago and continued to worsen and the next morning when she called to speak to him from work he was not making sense, and he was also hallucinating.  She felt that his CO2 level may have been to high again, and she states that he had the same presentation when he was hospitalized before from  12/31/2012  Until 01/04/2013.   He was evaluated in the ED and an ABG was performed which revealed a CO2 level of 111, and he was placed on BIPAP.   A repeat ABG was performed and he was slowly improving on the BIPAP, and his CO2 level was found to be 103.  He was referred for medical admission.      Review of Systems:   Unable to Obtain from the Patient   Past Medical History  Diagnosis Date  . Renal insufficiency   . Diabetes mellitus   . GERD (gastroesophageal reflux disease)   . Gastroparesis   . Esophageal stricture   . Hyperlipemia   . Hypertension   . Pancreatitis   . Chronic respiratory disease   . Depression   . Seizure disorder   . Arthritis   . Diverticulosis   . Sleep apnea     CPAP Machine  . Obesity   . ED (erectile dysfunction)   . Sleep apnea   . COPD (chronic obstructive pulmonary disease)     Gold Stage  III/IV  . H/O polycythemia   . Chronic kidney disease (CKD), stage III (moderate)   . DDD (degenerative disc disease), lumbar   . History of esophageal stricture   . Erectile dysfunction   . Peptic ulcer disease   . History of colon polyps   . Gout   . Gastroparesis   . Acute encephalopathy 06/20/2012  . ADENOCARCINOMA, PROSTATE, HX OF  08/05/2009    Qualifier: Diagnosis of  By: Koleen Distance CMA (AAMA), Hulan Saas    . CHF (congestive heart failure) 05/19/2012  . DIVERTICULOSIS, COLON 08/05/2009    Qualifier: Diagnosis of  By: Koleen Distance CMA (AAMA), Hulan Saas    . Obesity hypoventilation syndrome 04/28/2011  . OSA (obstructive sleep apnea) 09/22/2006    Auto 2013:  Optimal pressure 13/10   . SEIZURE DISORDER 09/22/2006    Qualifier: Diagnosis of  By: Nena Jordan   . THROMBOCYTOPENIA 04/17/2007    Chronic, Qualifier: Diagnosis of  By: Thereasa Solo      Past Surgical History  Procedure Laterality Date  . Prostate surgery    . Coronary artery bypass graft      x 4  . Bone marrow biopsy    . Cardiac catheterization  05/02/2008  . Back surgery      lumbar    Prior to Admission medications   Medication Sig Start Date End Date Taking? Authorizing Provider  acetaminophen (TYLENOL) 500 MG tablet Take 500 mg by mouth every 6 (six) hours as needed for pain ("per bottle").   Yes Historical Provider, MD  albuterol (PROVENTIL) (2.5 MG/3ML) 0.083% nebulizer solution Take 6 mLs (5 mg total)  by nebulization 4 (four) times daily. Dx 496 01/04/13  Yes Simonne Martinet, NP  bisoprolol (ZEBETA) 5 MG tablet TAKE 1 TABLET BY MOUTH TWICE DAILY 09/27/12  Yes Ronnald Nian, MD  budesonide (PULMICORT) 0.25 MG/2ML nebulizer solution Take 2 mLs (0.25 mg total) by nebulization 4 (four) times daily. 12/27/12  Yes Nyoka Cowden, MD  calcitRIOL (ROCALTROL) 0.25 MCG capsule Take 1 capsule (0.25 mcg total) by mouth daily. 12/24/11  Yes Ronnald Nian, MD  furosemide (LASIX) 80 MG tablet Take 1 tablet (80 mg total) by mouth 2 (two) times daily. 01/04/13  Yes Simonne Martinet, NP  insulin aspart (NOVOLOG) 100 UNIT/ML injection Inject 20 Units into the skin 3 (three) times daily with meals.   Yes Historical Provider, MD  insulin NPH (HUMULIN N) 100 UNIT/ML injection Inject 20-22 Units into the skin 2 (two) times daily. Take 20 units in the morning and 22 units at  night   Yes Historical Provider, MD  ipratropium (ATROVENT) 0.02 % nebulizer solution Take 2.5 mLs (0.5 mg total) by nebulization 4 (four) times daily. DX: 496 11/17/12 11/17/13 Yes Barbaraann Share, MD  pantoprazole (PROTONIX) 40 MG tablet Take 30-60 min before first meal of the day 12/27/12  Yes Nyoka Cowden, MD  simvastatin (ZOCOR) 40 MG tablet TAKE 1 TABLET BY MOUTH EVERY EVENING 09/27/12  Yes Ronnald Nian, MD  amoxicillin-clavulanate (AUGMENTIN) 875-125 MG per tablet Take 1 tablet by mouth 2 (two) times daily. 01/04/13   Simonne Martinet, NP  fluticasone (FLONASE) 50 MCG/ACT nasal spray Place 2 sprays into both nostrils daily. 01/04/13   Simonne Martinet, NP  predniSONE (DELTASONE) 10 MG tablet Take 4 tabs  daily with food x 4 days, then 3 tabs daily x 4 days, then 2 tabs daily x 4 days, then 1 tab daily x4 days then stop. #40 01/04/13   Simonne Martinet, NP    No Known Allergies  Social History:  Lives Alone    reports that he quit smoking about 7 months ago. His smoking use included Cigarettes. He has a 60 pack-year smoking history. He has never used smokeless tobacco. He reports that he does not drink alcohol or use illicit drugs.     Family History  Problem Relation Age of Onset  . Colon cancer Neg Hx   . Cancer Brother     unsure what kind  . Heart disease Mother   . Heart disease Father   . Emphysema Father        Physical Exam:  GEN:  Pleasant Morbidly Obese  Elderly  72 y.o.African American male  examined and on BIPAP in Mild distress; cooperative with exam Filed Vitals:   01/06/13 2349 01/07/13 0017 01/07/13 0042 01/07/13 0258  BP: 149/62     Pulse: 98     Temp: 98.5 F (36.9 C)     TempSrc: Oral     Resp: 36     Height:   5\' 8"  (1.727 m)   Weight:    120.1 kg (264 lb 12.4 oz)  SpO2: 91% 94%     Blood pressure 149/62, pulse 98, temperature 98.5 F (36.9 C), temperature source Oral, resp. rate 36, height 5\' 8"  (1.727 m), weight 120.1 kg (264 lb 12.4 oz), SpO2  94.00%. PSYCH: He is alert and oriented x4; does not appear anxious does not appear depressed; affect is normal HEENT: Normocephalic and Atraumatic, Mucous membranes pink; PERRLA; EOM intact; Fundi:  Benign;  No scleral  icterus, Nares: Patent, Oropharynx: Clear, Fair Dentition, Neck:  FROM, no cervical lymphadenopathy nor thyromegaly or carotid bruit; no JVD; Breasts:: Not examined CHEST WALL: No tenderness CHEST: Normal respiration, clear to auscultation bilaterally HEART: Regular rate and rhythm; no murmurs rubs or gallops BACK: No kyphosis or scoliosis; no CVA tenderness ABDOMEN: Positive Bowel Sounds,  Obese, soft non-tender; no masses, no organomegaly, no pannus; no intertriginous candida. Rectal Exam: Not done EXTREMITIES: No cyanosis, clubbing or edema; no ulcerations. Genitalia: not examined PULSES: 2+ and symmetric SKIN: Normal hydration no rash or ulceration CNS: Cranial nerves 2-12 grossly intact no focal neurologic deficit    Labs on Admission:  Basic Metabolic Panel:  Recent Labs Lab 12/31/12 1740 01/01/13 0340 01/03/13 0530 01/04/13 0528 01/07/13 0044  NA 142 140 139 143 143  K 4.0 4.7 5.3* 4.7 5.0  CL 94* 95* 93* 95* 96  CO2 44* 42* 39* 38*  --   GLUCOSE 209* 105* 333* 285* 247*  BUN 47* 51* 63* 59* 76*  CREATININE 2.51* 2.80* 2.83* 2.82* 3.00*  CALCIUM 10.6* 10.4 10.4 10.1  --    Liver Function Tests:  Recent Labs Lab 12/31/12 1740  AST 17  ALT 15  ALKPHOS 69  BILITOT 0.4  PROT 8.3  ALBUMIN 3.9   No results found for this basename: LIPASE, AMYLASE,  in the last 168 hours No results found for this basename: AMMONIA,  in the last 168 hours CBC:  Recent Labs Lab 12/31/12 1740 01/01/13 0340 01/03/13 0530 01/07/13 0035 01/07/13 0044  WBC 13.3* 8.9 11.6* 15.5*  --   NEUTROABS  --   --   --  11.9*  --   HGB 12.9* 11.5* 12.0* 12.8* 15.3  HCT 43.5 39.8 40.2 44.3 45.0  MCV 98.2 97.5 95.3 100.0  --   PLT 56* 52* 84* 79*  --    Cardiac  Enzymes: No results found for this basename: CKTOTAL, CKMB, CKMBINDEX, TROPONINI,  in the last 168 hours  BNP (last 3 results)  Recent Labs  05/12/12 1300 12/31/12 1740 01/07/13 0035  PROBNP 1198.0* 952.7* 488.7*   CBG:  Recent Labs Lab 01/03/13 1129 01/03/13 1649 01/03/13 2149 01/04/13 0728 01/04/13 1210  GLUCAP 145* 331* 270* 196* 212*    Radiological Exams on Admission: Dg Chest Portable 1 View  01/07/2013   CLINICAL DATA:  Wheezing.  EXAM: PORTABLE CHEST - 1 VIEW  COMPARISON:  12/31/2012  FINDINGS: The heart size and mediastinal contours are within normal limits. Pulmonary vascular congestion noted. No airspace consolidation. The visualized skeletal structures are unremarkable.  IMPRESSION: Pulmonary vascular congestion.   Electronically Signed   By: Signa Kell M.D.   On: 01/07/2013 01:25     EKG:    Assessment/Plan Principal Problem:   Respiratory failure with hypercapnia Active Problems:   OSA (obstructive sleep apnea)   Acute exacerbation of chronic obstructive pulmonary disease (COPD)   Cor pulmonale   CHF (congestive heart failure)   Acute encephalopathy   DIABETES MELLITUS, TYPE II   HYPERLIPIDEMIA   Morbid obesity   HYPERTENSION   AKI with CKD Stage III      1.  Acute Respiratory Failure with Hypercarbia-      Multifactorial - due to COPD and OSA,  Improving on BIPAP, and receiving     a High dose Steroid Taper and DUONeb Rxs.   Monitor in SDU.      2.  COPD Exacerbation- Same Rx as in #1.     3.  OSA-  BIPAP at home is qhs, ? Compliance.     4.  Cor Pulmonale-  Hx ,  sequelae of OSA, and COPD Lung Disease.     5.  CHF- improved at this time. Lasix on hold due to worsening ARF.     6.  Acute Encephalopathy- Due to Hypercarbia,  Should improve as CO2 Decreases.   Repeat ABG with     CO2=103 ( from 111) on BIPAP.    7.  AKI with CKD Stage III-  Gentle hydration and hold next dose of Lasix, Monitor BUN/Cr.    8.  DM2-  Continue    Humulin Insulin and  And SSI coverage PRN.     9.  Hypertension-  Continue on Zebeta, and Lasix on Hold, Monitor BPs.       10.   Hyperlipidemia- Continue on Simvastatin Rx.    11. Morbid Obesity- Need Weight Loss.          Code Status:  FULL CODE Family Communication:    Sister At Bedside Disposition Plan:       Inpatient  Time spent:  63 Minutes  Ron Parker [facility] Pager [phone]  If 7PM-7AM, please contact night-coverage [email or web address] Password [email or web address] 01/07/2013, 3:57 AM

## 2013-01-07 NOTE — Progress Notes (Signed)
[facility] PROGRESS NOTE  [name] [name] OZH:[id number] DOB: [birth year] DOA: 01/06/2013 PCP: Georgianne Fick, MD  Assessment/Plan: Acute on Chronic Hypercarbic Respiratory Failure -2/2 COPD/OSA/OHS/Cor pulmonale and maybe a component of decompensated diastolic CHF. -Remains acidotic on ABG. -Continue NIPPV for now, but high risk for intubation. -CCM on board and appreciate their input.  COPD -Nebs/steroids.  Acute on Chronic Diastolic CHF -Continue lasix. -Strive for negative fluid balance.  HTN -Fair control. -No med changes for now.  CKD Stage IV -At baseline. -Follow renal function with diuresis.  Acute Metabolic Encephalopathy -2/2 hypercarbia and acidosis. -Follow on NIPPV.  Diabetes Mellitus -Fair control. -Continue SSI.  Code Status: Full Code Family Communication: None  Disposition Plan: To be determined. Keep in SDU today. High risk for intubation.   Consultants:  CCM, Dr. Delford Field   Antibiotics:  None   Subjective: Not very responsive. Unable to answer questions.  Objective: Filed Vitals:   01/07/13 0845 01/07/13 0848 01/07/13 0900 01/07/13 1000  BP:   161/47 168/64  Pulse:  81 79 77  Temp:      TempSrc:      Resp:  24 10 21   Height:      Weight:      SpO2: 94%  96% 96%    Intake/Output Summary (Last 24 hours) at 01/07/13 1051 Last data filed at 01/07/13 1020  Gross per 24 hour  Intake     70 ml  Output    320 ml  Net   -250 ml   Filed Weights   01/07/13 0258 01/07/13 0435  Weight: 120.1 kg (264 lb 12.4 oz) 120.4 kg (265 lb 6.9 oz)    Exam:   General:  On BiPap. Not able to answer questions. Somnolent.  Cardiovascular: RRR  Respiratory: distant breath sounds.  Abdomen: obese, S/NT  Extremities: 1+edema bilaterally   Neurologic:  Somnolent.  Data Reviewed: Basic Metabolic Panel:  Recent Labs Lab 12/31/12 1740 01/01/13 0340 01/03/13 0530 01/04/13 0528 01/07/13 0044 01/07/13 0623  NA 142 140 139 143  143 141  K 4.0 4.7 5.3* 4.7 5.0 4.7  CL 94* 95* 93* 95* 96 97  CO2 44* 42* 39* 38*  --  38*  GLUCOSE 209* 105* 333* 285* 247* 247*  BUN 47* 51* 63* 59* 76* 65*  CREATININE 2.51* 2.80* 2.83* 2.82* 3.00* 2.79*  CALCIUM 10.6* 10.4 10.4 10.1  --  9.8   Liver Function Tests:  Recent Labs Lab 12/31/12 1740  AST 17  ALT 15  ALKPHOS 69  BILITOT 0.4  PROT 8.3  ALBUMIN 3.9   No results found for this basename: LIPASE, AMYLASE,  in the last 168 hours No results found for this basename: AMMONIA,  in the last 168 hours CBC:  Recent Labs Lab 12/31/12 1740 01/01/13 0340 01/03/13 0530 01/07/13 0035 01/07/13 0044 01/07/13 0623  WBC 13.3* 8.9 11.6* 15.5*  --  18.8*  NEUTROABS  --   --   --  11.9*  --   --   HGB 12.9* 11.5* 12.0* 12.8* 15.3 12.3*  HCT 43.5 39.8 40.2 44.3 45.0 42.8  MCV 98.2 97.5 95.3 100.0  --  99.8  PLT 56* 52* 84* 79*  --  59*   Cardiac Enzymes: No results found for this basename: CKTOTAL, CKMB, CKMBINDEX, TROPONINI,  in the last 168 hours BNP (last 3 results)  Recent Labs  05/12/12 1300 12/31/12 1740 01/07/13 0035  PROBNP 1198.0* 952.7* 488.7*   CBG:  Recent Labs Lab 01/03/13 2149 01/04/13 0728 01/04/13  1210 01/07/13 0439 01/07/13 0808  GLUCAP 270* 196* 212* 232* 258*    Recent Results (from the past 240 hour(s))  MRSA PCR SCREENING     Status: Abnormal   Collection Time    12/31/12  9:56 PM      Result Value Range Status   MRSA by PCR POSITIVE (*) NEGATIVE Final   Comment:            The GeneXpert MRSA Assay (FDA     approved for NASAL specimens     only), is one component of a     comprehensive MRSA colonization     surveillance program. It is not     intended to diagnose MRSA     infection nor to guide or     monitor treatment for     MRSA infections.     RESULT CALLED TO, READ BACK BY AND VERIFIED WITH:     CARMICHAEL,J RN @0120  ON 11.3.2014 BY MCREYNOLDS,B     Studies: Dg Chest Portable 1 View  01/07/2013   CLINICAL DATA:   Wheezing.  EXAM: PORTABLE CHEST - 1 VIEW  COMPARISON:  12/31/2012  FINDINGS: The heart size and mediastinal contours are within normal limits. Pulmonary vascular congestion noted. No airspace consolidation. The visualized skeletal structures are unremarkable.  IMPRESSION: Pulmonary vascular congestion.   Electronically Signed   By: Signa Kell M.D.   On: 01/07/2013 01:25    Scheduled Meds: . ipratropium  0.5 mg Nebulization QID   And  . albuterol  2.5 mg Nebulization Q4H  . albuterol  5 mg Nebulization Once  . antiseptic oral rinse  15 mL Mouth Rinse q12n4p  . chlorhexidine  15 mL Mouth Rinse BID  . [START ON 01/08/2013] Chlorhexidine Gluconate Cloth  6 each Topical Q0600  . enoxaparin (LOVENOX) injection  40 mg Subcutaneous Q24H  . furosemide  40 mg Intravenous Q6H  . insulin aspart  0-9 Units Subcutaneous Q4H  . methylPREDNISolone (SOLU-MEDROL) injection  80 mg Intravenous Q6H  . mupirocin ointment  1 application Nasal BID  . sodium chloride  3 mL Intravenous Q12H   Continuous Infusions:   Principal Problem:   Respiratory failure with hypercapnia Active Problems:   DM type 2, uncontrolled, with renal complications   HYPERLIPIDEMIA   Morbid obesity   HYPERTENSION   OSA (obstructive sleep apnea)   Acute exacerbation of chronic obstructive pulmonary disease (COPD)   Cor pulmonale   Chronic respiratory failure   Acute encephalopathy    Time spent: 45 minutes.    Chaya Jan  [facility] Pager [phone]  If 7PM-7AM, please contact night-coverage at [email or web address], password [facility] 01/07/2013, 10:51 AM  LOS: 1 day

## 2013-01-07 NOTE — Consult Note (Signed)
PULMONARY  / CRITICAL CARE MEDICINE  Name: [name] [name] MRN: [id number] DOB: [birth year]    ADMISSION DATE:  01/06/2013 CONSULTATION DATE:  01/07/2013   REFERRING MD :  [facility] PRIMARY SERVICE: [facility]  CHIEF COMPLAINT:   Resp distress  BRIEF PATIENT DESCRIPTION:  72 y.o.M with Copd, OSA, CHF readmit after just d/c two days ago from [facility] service.  Pt now in iCU on bipap.  SIGNIFICANT EVENTS / STUDIES:    LINES / TUBES: PIV  CULTURES: none  ANTIBIOTICS: none  HISTORY OF PRESENT ILLNESS:   72 y.o.M COPD OSA OHS CHF  Readmit after two days with acute on chronic resp failure.  Pt in pulm edema and severe CO2 elevation. Pt also delirious.   See recent D/C summary as follows: This is a 72 year old male f/b [name] for chronic resp failure in the setting of Gold stage III/IV, class D COPD, OHS/OSA, and gd II diastolic dysfxn. Last seen in our office on 10/29 reporting ~4week h/o progressive dyspnea and decreased activity tol. At time of visit his sats were 70% on pulse 5 liters. He presented to the ER on 11/2 w/ progression of his dyspnea to the point that he was SOB at rest. He reports the dyspnea has been gradually progressive over the last 4 weeks, he reports chronic nasal congestion, chronic cough, mostly in the am and pm which is productive of clear sputum. Denies sick exposure, fever or chills. Denies CP. Does have LE swelling. Has had some light headedness from time to time. Was admitted by IM service. Treatment to date has included: NIPPV, diuresis, systemic steroids and BDs. He felt better after this. PCCM has been asked to see re: acute on chronic resp failure, as he is a patient in our clinic we assumed his care.  Further evaluation and treatment include addition of systemic steroids, nasal hygiene for PND, and empiric augmentin for possible sinusitis as a contributing factor. He continued to improve with these measures. For completeness we did get V/Q scan and dopplers to rule out  possible VTE as contributing cause of worsening respiratory failure. These were negative.  He had improved close to baseline as of 11/5. At that point we focused the remainder of his care on being sure he was prepared for transition to home. This included the following: PT/OT consultation, walking oximetry which showed the following:  Patient Saturations on Room Air at Rest = 84%  Patient Saturations on 3 L while Ambulating = 78%  Patient Saturations on 6 Liters of oxygen while Ambulating = 88%  Please briefly explain why patient needs home oxygen: Patient requires 3 L 02 at rest. Oxygen saturation 78% on 3L while ambulating. Oxygen had to be increased to 6L to get o2 sats above 88%  Based on this we felt that a large contributing factor may have been simply inadequate oxygen at home as he did have sats in 70s on pulsed oxygen. Based on his O2 requirements we have set him up with oximizer for home w/ plan to use 3 liters at rest and increase to 6 liters w/ activity .In addition we made simple adjustments in his glycemic control, added back his routine medical regimen and prepared him for home. His care will be as outlined below.  Discharge Plan by diagnoses  Acute on Chronic respiratory failure (multi-factorial): in setting of decompensated OHS/OSA , cor pulmonale, and decompensated diastolic HF, super-imposed on underlying GOLD st D COPD +/- mild AECOPD and post-nasal gtt. Marland Kitchen  >  better w/ current rx focusing on rx for AECOPD AND diuresis  P:  Continue nasal hygiene  Home on regular BD regimen  Continue systemic steroids -->oral pred 11/5, taper to off  Home on 3 liters via oximizer w/ plan to increase to 6 liters w/ activity  Continue nocturnal BIPAP  F/u our office 1 wk after d/c  Chronic renal failure /CKD stage III  Baseline scr in the 1.9-->2.1 range  Scr 2.8-->2.83, he is followed by nephrology  P:  Home on his regular lasix dosing  Has f/u with nephrology in Dec  We will check BMP next  week, may need to adjust lasix  HTN, hyperlipidemia  P:  Continue zebeta, zocor and lasix  DM  W/ hyperglycemia exacerbated by steroids.  P:  Home on his regular regimen  Has f/u with PCP, he may need further evaluation for his glycemic control  Deconditioning  P:  Home with PT/OT       PAST MEDICAL HISTORY :  Past Medical History  Diagnosis Date  . Renal insufficiency   . Diabetes mellitus   . GERD (gastroesophageal reflux disease)   . Gastroparesis   . Esophageal stricture   . Hyperlipemia   . Hypertension   . Pancreatitis   . Chronic respiratory disease   . Depression   . Seizure disorder   . Arthritis   . Diverticulosis   . Sleep apnea     CPAP Machine  . Obesity   . ED (erectile dysfunction)   . Sleep apnea   . COPD (chronic obstructive pulmonary disease)     Gold Stage  III/IV  . H/O polycythemia   . Chronic kidney disease (CKD), stage III (moderate)   . DDD (degenerative disc disease), lumbar   . History of esophageal stricture   . Erectile dysfunction   . Peptic ulcer disease   . History of colon polyps   . Gout   . Gastroparesis   . Acute encephalopathy 06/20/2012  . 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 CHF (congestive heart failure) 05/19/2012  . DIVERTICULOSIS, COLON 08/05/2009    Qualifier: Diagnosis of  By: Koleen Distance CMA (AAMA), Hulan Saas    . Obesity hypoventilation syndrome 04/28/2011  . OSA (obstructive sleep apnea) 09/22/2006    Auto 2013:  Optimal pressure 13/10   . SEIZURE DISORDER 09/22/2006    Qualifier: Diagnosis of  By: Nena Jordan   . THROMBOCYTOPENIA 04/17/2007    Chronic, Qualifier: Diagnosis of  By: Thereasa Solo     Past Surgical History  Procedure Laterality Date  . Prostate surgery    . Coronary artery bypass graft      x 4  . Bone marrow biopsy    . Cardiac catheterization  05/02/2008  . Back surgery      lumbar   Prior to Admission medications    Medication Sig Start Date End Date Taking? Authorizing Provider  acetaminophen (TYLENOL) 500 MG tablet Take 500 mg by mouth every 6 (six) hours as needed for pain ("per bottle").   Yes Historical Provider, MD  albuterol (PROVENTIL) (2.5 MG/3ML) 0.083% nebulizer solution Take 6 mLs (5 mg total) by nebulization 4 (four) times daily. Dx 496 01/04/13  Yes Simonne Martinet, NP  bisoprolol (ZEBETA) 5 MG tablet TAKE 1 TABLET BY MOUTH TWICE DAILY 09/27/12  Yes Ronnald Nian, MD  budesonide (PULMICORT) 0.25 MG/2ML nebulizer solution Take 2 mLs (  0.25 mg total) by nebulization 4 (four) times daily. 12/27/12  Yes Nyoka Cowden, MD  calcitRIOL (ROCALTROL) 0.25 MCG capsule Take 1 capsule (0.25 mcg total) by mouth daily. 12/24/11  Yes Ronnald Nian, MD  furosemide (LASIX) 80 MG tablet Take 1 tablet (80 mg total) by mouth 2 (two) times daily. 01/04/13  Yes Simonne Martinet, NP  insulin aspart (NOVOLOG) 100 UNIT/ML injection Inject 20 Units into the skin 3 (three) times daily with meals.   Yes Historical Provider, MD  insulin NPH (HUMULIN N) 100 UNIT/ML injection Inject 20-22 Units into the skin 2 (two) times daily. Take 20 units in the morning and 22 units at night   Yes Historical Provider, MD  ipratropium (ATROVENT) 0.02 % nebulizer solution Take 2.5 mLs (0.5 mg total) by nebulization 4 (four) times daily. DX: 496 11/17/12 11/17/13 Yes Barbaraann Share, MD  pantoprazole (PROTONIX) 40 MG tablet Take 30-60 min before first meal of the day 12/27/12  Yes Nyoka Cowden, MD  simvastatin (ZOCOR) 40 MG tablet TAKE 1 TABLET BY MOUTH EVERY EVENING 09/27/12  Yes Ronnald Nian, MD  amoxicillin-clavulanate (AUGMENTIN) 875-125 MG per tablet Take 1 tablet by mouth 2 (two) times daily. 01/04/13   Simonne Martinet, NP  fluticasone (FLONASE) 50 MCG/ACT nasal spray Place 2 sprays into both nostrils daily. 01/04/13   Simonne Martinet, NP  predniSONE (DELTASONE) 10 MG tablet Take 4 tabs  daily with food x 4 days, then 3 tabs daily x 4 days,  then 2 tabs daily x 4 days, then 1 tab daily x4 days then stop. #40 01/04/13   Simonne Martinet, NP   No Known Allergies  FAMILY HISTORY:  Family History  Problem Relation Age of Onset  . Colon cancer Neg Hx   . Cancer Brother     unsure what kind  . Heart disease Mother   . Heart disease Father   . Emphysema Father    SOCIAL HISTORY:  reports that he quit smoking about 7 months ago. His smoking use included Cigarettes. He has a 60 pack-year smoking history. He has never used smokeless tobacco. He reports that he does not drink alcohol or use illicit drugs.  REVIEW OF SYSTEMS:  Not obtainable  SUBJECTIVE:   VITAL SIGNS: Temp:  [98.3 F (36.8 C)-98.8 F (37.1 C)] 98.8 F (37.1 C) (11/09 0800) Pulse Rate:  [81-98] 81 (11/09 0848) Resp:  [16-36] 24 (11/09 0848) BP: (104-180)/(50-124) 180/63 mmHg (11/09 0800) SpO2:  [91 %-100 %] 94 % (11/09 0845) FiO2 (%):  [40 %] 40 % (11/09 0845) Weight:  [120.1 kg (264 lb 12.4 oz)-120.4 kg (265 lb 6.9 oz)] 120.4 kg (265 lb 6.9 oz) (11/09 0435) HEMODYNAMICS:   VENTILATOR SETTINGS: Vent Mode:  [-] Other (Comment) FiO2 (%):  [40 %] 40 % Set Rate:  [15 bmp] 15 bmp PEEP:  [6 cmH20] 6 cmH20 INTAKE / OUTPUT: Intake/Output     11/08 0701 - 11/09 0700 11/09 0701 - 11/10 0700   I.V. (mL/kg) 40 (0.3) 10 (0.1)   Total Intake(mL/kg) 40 (0.3) 10 (0.1)   Net +40 +10        Urine Occurrence 1 x      PHYSICAL EXAMINATION: General:  Somnolent on bipap Neuro:  somnolent HEENT:  No jvd. Cardiovascular:  RRR nl s1 s2 no s3/ s4 Lungs:  Distant BS Abdomen:  Soft nt Musculoskeletal:  from Skin:  clear  LABS:  CBC  Recent Labs Lab 01/03/13 0530 01/07/13  1610 01/07/13 0044 01/07/13 0623  WBC 11.6* 15.5*  --  18.8*  HGB 12.0* 12.8* 15.3 12.3*  HCT 40.2 44.3 45.0 42.8  PLT 84* 79*  --  59*   Coag's No results found for this basename: APTT, INR,  in the last 168 hours BMET  Recent Labs Lab 01/03/13 0530 01/04/13 0528 01/07/13 0044  01/07/13 0623  NA 139 143 143 141  K 5.3* 4.7 5.0 4.7  CL 93* 95* 96 97  CO2 39* 38*  --  38*  BUN 63* 59* 76* 65*  CREATININE 2.83* 2.82* 3.00* 2.79*  GLUCOSE 333* 285* 247* 247*   Electrolytes  Recent Labs Lab 01/03/13 0530 01/04/13 0528 01/07/13 0623  CALCIUM 10.4 10.1 9.8   Sepsis Markers No results found for this basename: LATICACIDVEN, PROCALCITON, O2SATVEN,  in the last 168 hours ABG  Recent Labs Lab 01/07/13 0010 01/07/13 0330 01/07/13 0730  PHART 7.203* 7.229* 7.255*  PCO2ART 111.0* 103.0* 94.7*  PO2ART 76.5* 54.4* 62.2*   Liver Enzymes  Recent Labs Lab 12/31/12 1740  AST 17  ALT 15  ALKPHOS 69  BILITOT 0.4  ALBUMIN 3.9   Cardiac Enzymes  Recent Labs Lab 12/31/12 1740 01/07/13 0035  PROBNP 952.7* 488.7*   Glucose  Recent Labs Lab 01/03/13 1649 01/03/13 2149 01/04/13 0728 01/04/13 1210 01/07/13 0439 01/07/13 0808  GLUCAP 331* 270* 196* 212* 232* 258*    Imaging Dg Chest Portable 1 View  01/07/2013   CLINICAL DATA:  Wheezing.  EXAM: PORTABLE CHEST - 1 VIEW  COMPARISON:  12/31/2012  FINDINGS: The heart size and mediastinal contours are within normal limits. Pulmonary vascular congestion noted. No airspace consolidation. The visualized skeletal structures are unremarkable.  IMPRESSION: Pulmonary vascular congestion.   Electronically Signed   By: Signa Kell M.D.   On: 01/07/2013 01:25     CXR: pulm edema  ASSESSMENT / PLAN:  PULMONARY A:Acute on Chronic respiratory failure (multi-factorial): in setting of decompensated OHS/OSA , cor pulmonale, and decompensated diastolic HF, super-imposed on underlying GOLD st D COPD +/- mild AECOPD and post-nasal gtt. .  Readmission <2days. Acute on chronic resp failure and pulme edema  P:   bipap adjust Get larger mask Diurese High risk need vent  CARDIOVASCULAR A: HTN, HL P:  Diurese zebeta  RENAL A:  CKD , met alkalosis with resp acidosis P:    Lasix monitor  GASTROINTESTINAL A:  No acute issues P:   monitor  HEMATOLOGIC A:  No issues P:  monitor  INFECTIOUS A:  No issues P:   monitor  ENDOCRINE A:  DM2 with hyperglycemia and renal dz   P:   SSI per prim team  NEUROLOGIC A:  AMS d/t hypercarbia P:   Monitor D/c all narcotics/sedatives  TODAY'S SUMMARY: 72 y.o.M COPD OSA OHS CHF CKD DM2 adm with acute on chron resp failure on bipap. High risk needing vent   I have personally obtained a history, examined the patient, evaluated laboratory and imaging results, formulated the assessment and plan and placed orders. CRITICAL CARE: The patient is critically ill with multiple organ systems failure and requires high complexity decision making for assessment and support, frequent evaluation and titration of therapies, application of advanced monitoring technologies and extensive interpretation of multiple databases. Critical Care Time devoted to patient care services described in this note is 40 minutes.   Dorcas Carrow Beeper  [phone]  Cell  [phone]  If no response or cell goes to voicemail, call beeper [phone]]  Pulmonary and  Critical Care Medicine [facility] Pager: [phone]  01/07/2013, 9:06 AM

## 2013-01-08 DIAGNOSIS — J9600 Acute respiratory failure, unspecified whether with hypoxia or hypercapnia: Secondary | ICD-10-CM

## 2013-01-08 LAB — BLOOD GAS, ARTERIAL
Bicarbonate: 41.8 mEq/L — ABNORMAL HIGH (ref 20.0–24.0)
Expiratory PAP: 6
O2 Saturation: 97.7 %
TCO2: 38.2 mmol/L (ref 0–100)
pCO2 arterial: 75.6 mmHg (ref 35.0–45.0)
pH, Arterial: 7.362 (ref 7.350–7.450)
pO2, Arterial: 103 mmHg — ABNORMAL HIGH (ref 80.0–100.0)

## 2013-01-08 LAB — CBC
HCT: 38.2 % — ABNORMAL LOW (ref 39.0–52.0)
Hemoglobin: 11.4 g/dL — ABNORMAL LOW (ref 13.0–17.0)
MCH: 28.8 pg (ref 26.0–34.0)
MCHC: 29.8 g/dL — ABNORMAL LOW (ref 30.0–36.0)
MCV: 96.5 fL (ref 78.0–100.0)
Platelets: 68 K/uL — ABNORMAL LOW (ref 150–400)
RBC: 3.96 MIL/uL — ABNORMAL LOW (ref 4.22–5.81)
RDW: 14.1 % (ref 11.5–15.5)
WBC: 12.8 K/uL — ABNORMAL HIGH (ref 4.0–10.5)

## 2013-01-08 LAB — BASIC METABOLIC PANEL WITH GFR
BUN: 67 mg/dL — ABNORMAL HIGH (ref 6–23)
CO2: 42 meq/L (ref 19–32)
Calcium: 9.6 mg/dL (ref 8.4–10.5)
Chloride: 100 meq/L (ref 96–112)
Creatinine, Ser: 2.47 mg/dL — ABNORMAL HIGH (ref 0.50–1.35)
GFR calc Af Amer: 28 mL/min — ABNORMAL LOW (ref >90)
GFR calc non Af Amer: 24 mL/min — ABNORMAL LOW (ref >90)
Glucose, Bld: 260 mg/dL — ABNORMAL HIGH (ref 70–99)
Potassium: 5.2 meq/L — ABNORMAL HIGH (ref 3.5–5.1)
Sodium: 145 meq/L (ref 135–145)

## 2013-01-08 LAB — GLUCOSE, CAPILLARY
Glucose-Capillary: 233 mg/dL — ABNORMAL HIGH (ref 70–99)
Glucose-Capillary: 235 mg/dL — ABNORMAL HIGH (ref 70–99)
Glucose-Capillary: 235 mg/dL — ABNORMAL HIGH (ref 70–99)
Glucose-Capillary: 329 mg/dL — ABNORMAL HIGH (ref 70–99)
Glucose-Capillary: 395 mg/dL — ABNORMAL HIGH (ref 70–99)

## 2013-01-08 MED ORDER — SODIUM CHLORIDE 0.9 % IV SOLN
INTRAVENOUS | Status: DC
  Administered 2013-01-08: 3.1 [IU]/h via INTRAVENOUS
  Filled 2013-01-08 (×2): qty 1

## 2013-01-08 MED ORDER — HYDRALAZINE HCL 20 MG/ML IJ SOLN
10.0000 mg | Freq: Four times a day (QID) | INTRAMUSCULAR | Status: DC | PRN
  Administered 2013-01-08: 20 mg via INTRAVENOUS
  Filled 2013-01-08: qty 1

## 2013-01-08 MED ORDER — HEPARIN SODIUM (PORCINE) 5000 UNIT/ML IJ SOLN
5000.0000 [IU] | Freq: Three times a day (TID) | INTRAMUSCULAR | Status: DC
  Filled 2013-01-08 (×3): qty 1

## 2013-01-08 MED ORDER — ENOXAPARIN SODIUM 60 MG/0.6ML ~~LOC~~ SOLN: 60.0000 mg | SUBCUTANEOUS | Status: DC

## 2013-01-08 MED ORDER — INSULIN NPH (HUMAN) (ISOPHANE) 100 UNIT/ML ~~LOC~~ SUSP
10.0000 [IU] | Freq: Two times a day (BID) | SUBCUTANEOUS | Status: DC
  Administered 2013-01-09 – 2013-01-11 (×4): 10 [IU] via SUBCUTANEOUS
  Filled 2013-01-08: qty 10

## 2013-01-08 MED ORDER — FUROSEMIDE 10 MG/ML IJ SOLN
40.0000 mg | Freq: Once | INTRAMUSCULAR | Status: AC
  Administered 2013-01-08: 40 mg via INTRAVENOUS
  Filled 2013-01-08: qty 4

## 2013-01-08 MED ORDER — HEPARIN SODIUM (PORCINE) 5000 UNIT/ML IJ SOLN
5000.0000 [IU] | Freq: Three times a day (TID) | INTRAMUSCULAR | Status: DC
  Administered 2013-01-09 – 2013-01-11 (×8): 5000 [IU] via SUBCUTANEOUS
  Filled 2013-01-08 (×10): qty 1

## 2013-01-08 NOTE — Progress Notes (Signed)
[facility] PROGRESS NOTE  [name] [name] YQM:[id number] DOB: 1941/02/08 DOA: 01/06/2013 PCP: Georgianne Fick, MD  Assessment/Plan: Acute on Chronic Hypercarbic Respiratory Failure -2/2 COPD/OSA/OHS/Cor pulmonale and maybe a component of decompensated diastolic CHF. -ABG improved today. -Continue NIPPV for now, will allow to remove for meals as he is alert today and is asking to eat. -CCM on board and appreciate their input.  COPD -Nebs/steroids.  Acute on Chronic Diastolic CHF -Continue lasix. -Strive for negative fluid balance. (2.5 L negative since admission).  HTN -Fair control. -No med changes for now.  CKD Stage IV -At baseline. -Follow renal function with diuresis.  Acute Metabolic Encephalopathy -2/2 hypercarbia and acidosis. -Follow on NIPPV. -Improved today.  Diabetes Mellitus -CBGs elevated. -Continue SSI. -Start NPH at half of home dose.  Code Status: Full Code Family Communication: None  Disposition Plan: To be determined. Keep in SDU today.    Consultants:  CCM   Antibiotics:  None   Subjective: More alert today. Tells me he has not been compliant with his NIPPV at home.  Objective: Filed Vitals:   01/08/13 0838 01/08/13 0900 01/08/13 1000 01/08/13 1240  BP: 158/50 158/70 184/83   Pulse: 65 72 71   Temp:    98.2 F (36.8 C)  TempSrc:    Oral  Resp: 20 25 12    Height:      Weight:      SpO2: 91% 97% 93% 96%    Intake/Output Summary (Last 24 hours) at 01/08/13 1358 Last data filed at 01/08/13 1300  Gross per 24 hour  Intake    240 ml  Output   2000 ml  Net  -1760 ml   Filed Weights   01/07/13 0258 01/07/13 0435 01/08/13 0404  Weight: 120.1 kg (264 lb 12.4 oz) 120.4 kg (265 lb 6.9 oz) 121.6 kg (268 lb 1.3 oz)    Exam:   General:  More awake today, able to answer questions.  Cardiovascular: RRR  Respiratory: distant breath sounds.  Abdomen: obese, S/NT  Extremities: 1+edema bilaterally   Data  Reviewed: Basic Metabolic Panel:  Recent Labs Lab 01/03/13 0530 01/04/13 0528 01/07/13 0044 01/07/13 0623 01/08/13 0348  NA 139 143 143 141 145  K 5.3* 4.7 5.0 4.7 5.2*  CL 93* 95* 96 97 100  CO2 39* 38*  --  38* 42*  GLUCOSE 333* 285* 247* 247* 260*  BUN 63* 59* 76* 65* 67*  CREATININE 2.83* 2.82* 3.00* 2.79* 2.47*  CALCIUM 10.4 10.1  --  9.8 9.6   Liver Function Tests: No results found for this basename: AST, ALT, ALKPHOS, BILITOT, PROT, ALBUMIN,  in the last 168 hours No results found for this basename: LIPASE, AMYLASE,  in the last 168 hours No results found for this basename: AMMONIA,  in the last 168 hours CBC:  Recent Labs Lab 01/03/13 0530 01/07/13 0035 01/07/13 0044 01/07/13 0623 01/08/13 0348  WBC 11.6* 15.5*  --  18.8* 12.8*  NEUTROABS  --  11.9*  --   --   --   HGB 12.0* 12.8* 15.3 12.3* 11.4*  HCT 40.2 44.3 45.0 42.8 38.2*  MCV 95.3 100.0  --  99.8 96.5  PLT 84* 79*  --  59* 68*   Cardiac Enzymes: No results found for this basename: CKTOTAL, CKMB, CKMBINDEX, TROPONINI,  in the last 168 hours BNP (last 3 results)  Recent Labs  05/12/12 1300 12/31/12 1740 01/07/13 0035  PROBNP 1198.0* 952.7* 488.7*   CBG:  Recent Labs Lab 01/07/13 2055 01/08/13  0960 01/08/13 0354 01/08/13 0836 01/08/13 1216  GLUCAP 250* 235* 233* 235* 286*    Recent Results (from the past 240 hour(s))  MRSA PCR SCREENING     Status: Abnormal   Collection Time    12/31/12  9:56 PM      Result Value Range Status   MRSA by PCR POSITIVE (*) NEGATIVE Final   Comment:            The GeneXpert MRSA Assay (FDA     approved for NASAL specimens     only), is one component of a     comprehensive MRSA colonization     surveillance program. It is not     intended to diagnose MRSA     infection nor to guide or     monitor treatment for     MRSA infections.     RESULT CALLED TO, READ BACK BY AND VERIFIED WITH:     CARMICHAEL,J RN @0120  ON 11.3.2014 BY MCREYNOLDS,B      Studies: Dg Chest Portable 1 View  01/07/2013   CLINICAL DATA:  Wheezing.  EXAM: PORTABLE CHEST - 1 VIEW  COMPARISON:  12/31/2012  FINDINGS: The heart size and mediastinal contours are within normal limits. Pulmonary vascular congestion noted. No airspace consolidation. The visualized skeletal structures are unremarkable.  IMPRESSION: Pulmonary vascular congestion.   Electronically Signed   By: Signa Kell M.D.   On: 01/07/2013 01:25    Scheduled Meds: . ipratropium  0.5 mg Nebulization QID   And  . albuterol  2.5 mg Nebulization Q4H  . albuterol  5 mg Nebulization Once  . antiseptic oral rinse  15 mL Mouth Rinse q12n4p  . bisoprolol  5 mg Oral Daily  . chlorhexidine  15 mL Mouth Rinse BID  . Chlorhexidine Gluconate Cloth  6 each Topical Q0600  . heparin subcutaneous  5,000 Units Subcutaneous Q8H  . insulin aspart  0-9 Units Subcutaneous Q4H  . methylPREDNISolone (SOLU-MEDROL) injection  80 mg Intravenous Q6H  . mupirocin ointment  1 application Nasal BID  . sodium chloride  3 mL Intravenous Q12H   Continuous Infusions:   Principal Problem:   Respiratory failure with hypercapnia Active Problems:   DM type 2, uncontrolled, with renal complications   HYPERLIPIDEMIA   Morbid obesity   HYPERTENSION   OSA (obstructive sleep apnea)   Acute exacerbation of chronic obstructive pulmonary disease (COPD)   Cor pulmonale   Chronic respiratory failure   Acute encephalopathy    Time spent: 35 minutes.    Chaya Jan  [facility] Pager [phone]  If 7PM-7AM, please contact night-coverage at [email or web address], password [facility] 01/08/2013, 1:58 PM  LOS: 2 days

## 2013-01-08 NOTE — Progress Notes (Signed)
Inpatient Diabetes Program Recommendations  AACE/ADA: New Consensus Statement on Inpatient Glycemic Control (2013)  Target Ranges:  Prepandial:   less than 140 mg/dL      Peak postprandial:   less than 180 mg/dL (1-2 hours)      Critically ill patients:  140 - 180 mg/dL   Reason for assessment:  Hyperglycemia  Inpatient Diabetes Program Recommendations Insulin - Basal: No basal insulin currently.  Takes NPH 20 units every morning and 22 units at HS.  Please consider starting at least half of home NPH dosages. Insulin - Meal Coverage: No meal coverage ordered.  Home regimen per Med Rec is Novolog 20 units tid with meals. HgbA1C: AiC was 7.8 01/01/13 Diet: CHO Modified Medium  Note: Patient has stage IV CKD.  Receiving SoluMedrol.  CBG's in mid to upper 200's.  See above recommendation to consider NPH 10 units every morning and NPH 12 units at HS.  Thank you.  Dachelle Molzahn S. Elsie Lincoln, RN, CNS, CDE Inpatient Diabetes Program, team pager [phone]

## 2013-01-08 NOTE — Progress Notes (Signed)
PULMONARY  / CRITICAL CARE MEDICINE  Name: [name] [name] MRN: [id number] DOB: 11-21-1940    ADMISSION DATE:  01/06/2013 CONSULTATION DATE:  01/08/2013   REFERRING MD :  [facility] PRIMARY SERVICE: TRH  CHIEF COMPLAINT:   Resp distress  BRIEF PATIENT DESCRIPTION:  72 y.o.M with Copd, OSA, CHF readmit after just d/c two days ago from [facility] service.  Pt now in iCU on bipap.    HISTORY OF PRESENT ILLNESS:   72 y.o.M COPD OSA OHS CHF  Readmit after two days with acute on chronic resp failure.  Pt in pulm edema and severe CO2 elevation. Pt also delirious.      SUBJECTIVE: denies CP, dyspnea Breathing better  VITAL SIGNS: Temp:  [97.7 F (36.5 C)-99.4 F (37.4 C)] 98 F (36.7 C) (11/10 0800) Pulse Rate:  [63-78] 72 (11/10 0900) Resp:  [13-26] 25 (11/10 0900) BP: (138-178)/(48-110) 158/70 mmHg (11/10 0900) SpO2:  [89 %-100 %] 97 % (11/10 0900) FiO2 (%):  [40 %] 40 % (11/10 0800) Weight:  [268 lb 1.3 oz (121.6 kg)] 268 lb 1.3 oz (121.6 kg) (11/10 0404) HEMODYNAMICS:   VENTILATOR SETTINGS: Vent Mode:  [-] Other (Comment) FiO2 (%):  [40 %] 40 % Set Rate:  [15 bmp] 15 bmp PEEP:  [6 cmH20] 6 cmH20 INTAKE / OUTPUT: Intake/Output     11/09 0701 - 11/10 0700 11/10 0701 - 11/11 0700   I.V. (mL/kg) 240 (2) 20 (0.2)   Total Intake(mL/kg) 240 (2) 20 (0.2)   Urine (mL/kg/hr) 2475 (0.8) 375 (1.1)   Total Output 2475 375   Net -2235 -355          PHYSICAL EXAMINATION: General: awake and follows commands  Neuro:  alert HEENT:  No jvd. Cardiovascular:  RRR nl s1 s2 no s3/ s4 Lungs:  Distant BS Abdomen:  Soft nt, obese Musculoskeletal:  intact Skin:  clear  LABS:  CBC  Recent Labs Lab 01/07/13 0035 01/07/13 0044 01/07/13 0623 01/08/13 0348  WBC 15.5*  --  18.8* 12.8*  HGB 12.8* 15.3 12.3* 11.4*  HCT 44.3 45.0 42.8 38.2*  PLT 79*  --  59* 68*   Coag's No results found for this basename: APTT, INR,  in the last 168 hours BMET  Recent Labs Lab 01/04/13 0528  01/07/13 0044 01/07/13 0623 01/08/13 0348  NA 143 143 141 145  K 4.7 5.0 4.7 5.2*  CL 95* 96 97 100  CO2 38*  --  38* 42*  BUN 59* 76* 65* 67*  CREATININE 2.82* 3.00* 2.79* 2.47*  GLUCOSE 285* 247* 247* 260*   Electrolytes  Recent Labs Lab 01/04/13 0528 01/07/13 0623 01/08/13 0348  CALCIUM 10.1 9.8 9.6   Sepsis Markers No results found for this basename: LATICACIDVEN, PROCALCITON, O2SATVEN,  in the last 168 hours ABG  Recent Labs Lab 01/07/13 0330 01/07/13 0730 01/08/13 0838  PHART 7.229* 7.255* 7.362  PCO2ART 103.0* 94.7* 75.6*  PO2ART 54.4* 62.2* 103.0*   Liver Enzymes No results found for this basename: AST, ALT, ALKPHOS, BILITOT, ALBUMIN,  in the last 168 hours Cardiac Enzymes  Recent Labs Lab 01/07/13 0035  PROBNP 488.7*   Glucose  Recent Labs Lab 01/07/13 1146 01/07/13 1553 01/07/13 2055 01/08/13 0056 01/08/13 0354 01/08/13 0836  GLUCAP 247* 256* 250* 235* 233* 235*    Imaging Dg Chest Portable 1 View  01/07/2013   CLINICAL DATA:  Wheezing.  EXAM: PORTABLE CHEST - 1 VIEW  COMPARISON:  12/31/2012  FINDINGS: The heart size and mediastinal contours  are within normal limits. Pulmonary vascular congestion noted. No airspace consolidation. The visualized skeletal structures are unremarkable.  IMPRESSION: Pulmonary vascular congestion.   Electronically Signed   By: Signa Kell M.D.   On: 01/07/2013 01:25     CXR:see vabove  ASSESSMENT / PLAN:  PULMONARY A:Acute on Chronic respiratory failure (multi-factorial): in setting of decompensated OHS/OSA , cor pulmonale, and decompensated diastolic HF, super-imposed on underlying GOLD st D COPD +/- mild AECOPD and post-nasal gtt. .  Readmission <2days. Acute on chronic resp failure and pulme edema  P:   bipap adjusted, change to prn and qhs 11-10 Diurese as tolerated   CARDIOVASCULAR A: HTN, HL P:  Diurese zebeta  RENAL Lab Results  Component Value Date   CREATININE 2.47* 01/08/2013    CREATININE 2.79* 01/07/2013   CREATININE 3.00* 01/07/2013   CREATININE 1.88* 05/25/2012   CREATININE 1.75* 04/25/2012   CREATININE 1.77* 08/30/2011    ASSESSMENT: A:  CKD , met alkalosis with resp acidosis P:   Lasix Monitor May need renal consult   GASTROINTESTINAL A:  No acute issues P:   monitor  HEMATOLOGIC A:  No issues P:  monitor  INFECTIOUS A:  No issues P:   monitor  ENDOCRINE A:  DM2 with hyperglycemia and renal dz   P:   SSI per prim team  NEUROLOGIC A:  AMS d/t hypercarbia P:   Monitor D/c all narcotics/sedatives  TODAY'S SUMMARY: 72 y.o.M COPD OSA OHS CHF CKD DM2 adm with acute on chron resp failure on bipap. Improved - Ok to transfer to tele   [facility] V.  [phone] 01/08/2013, 9:53 AM

## 2013-01-08 NOTE — Progress Notes (Signed)
CRITICAL VALUE ALERT  Critical value received:  CO2=42  Date of notification:  01/08/13  Time of notification:  04:58  Critical value read back:yes  Nurse who received alert:  Guinevere Scarlet, RN  MD notified (1st page):  Pola Corn, Dr. Darrick Penna  Time of first page:  04:58  MD notified (2nd page):  Time of second page:  Responding MD:  Pola Corn, Dr. Darrick Penna  Time MD responded:  04:58

## 2013-01-08 NOTE — Progress Notes (Signed)
eLink Physician-Brief Progress Note Patient Name: [name] [name] DOB: 11/23/1940 MRN: [id number]  Date of Service  01/08/2013   HPI/Events of Note   HTN  eICU Interventions  - continue bisoprolol - add hydralazine prn   Intervention Category Intermediate Interventions: Hypertension - evaluation and management  [name] [name] S. 01/08/2013, 9:30 PM

## 2013-01-09 ENCOUNTER — Inpatient Hospital Stay (HOSPITAL_COMMUNITY): Payer: Medicare Other

## 2013-01-09 LAB — GLUCOSE, CAPILLARY
Glucose-Capillary: 190 mg/dL — ABNORMAL HIGH (ref 70–99)
Glucose-Capillary: 182 mg/dL — ABNORMAL HIGH (ref 70–99)
Glucose-Capillary: 222 mg/dL — ABNORMAL HIGH (ref 70–99)
Glucose-Capillary: 303 mg/dL — ABNORMAL HIGH (ref 70–99)
Glucose-Capillary: 338 mg/dL — ABNORMAL HIGH (ref 70–99)
Glucose-Capillary: 354 mg/dL — ABNORMAL HIGH (ref 70–99)
Glucose-Capillary: 366 mg/dL — ABNORMAL HIGH (ref 70–99)
Glucose-Capillary: 320 mg/dL — ABNORMAL HIGH (ref 70–99)

## 2013-01-09 LAB — BASIC METABOLIC PANEL
BUN: 71 mg/dL — ABNORMAL HIGH (ref 6–23)
Calcium: 9.3 mg/dL (ref 8.4–10.5)
Creatinine, Ser: 2.31 mg/dL — ABNORMAL HIGH (ref 0.50–1.35)
GFR calc non Af Amer: 26 mL/min — ABNORMAL LOW (ref >90)
Glucose, Bld: 201 mg/dL — ABNORMAL HIGH (ref 70–99)
Potassium: 4.4 mEq/L (ref 3.5–5.1)

## 2013-01-09 LAB — CBC
Hemoglobin: 12 g/dL — ABNORMAL LOW (ref 13.0–17.0)
MCH: 28.8 pg (ref 26.0–34.0)
MCHC: 30.8 g/dL (ref 30.0–36.0)
MCV: 93.5 fL (ref 78.0–100.0)
RBC: 4.17 MIL/uL — ABNORMAL LOW (ref 4.22–5.81)
RDW: 14.2 % (ref 11.5–15.5)

## 2013-01-09 MED ORDER — FUROSEMIDE 80 MG PO TABS
80.0000 mg | ORAL_TABLET | Freq: Two times a day (BID) | ORAL | Status: DC
  Administered 2013-01-09 – 2013-01-11 (×5): 80 mg via ORAL
  Filled 2013-01-09 (×6): qty 1

## 2013-01-09 MED ORDER — INSULIN ASPART 100 UNIT/ML ~~LOC~~ SOLN
4.0000 [IU] | Freq: Three times a day (TID) | SUBCUTANEOUS | Status: DC
Start: 2013-01-09 — End: 2013-01-11
  Administered 2013-01-09 – 2013-01-11 (×8): 4 [IU] via SUBCUTANEOUS

## 2013-01-09 MED ORDER — INSULIN ASPART 100 UNIT/ML ~~LOC~~ SOLN
0.0000 [IU] | Freq: Three times a day (TID) | SUBCUTANEOUS | Status: DC
  Administered 2013-01-09: 8 [IU] via SUBCUTANEOUS
  Administered 2013-01-09: 3 [IU] via SUBCUTANEOUS
  Administered 2013-01-10 (×2): 8 [IU] via SUBCUTANEOUS
  Administered 2013-01-10 – 2013-01-11 (×2): 11 [IU] via SUBCUTANEOUS
  Administered 2013-01-11 (×2): 5 [IU] via SUBCUTANEOUS

## 2013-01-09 MED ORDER — PREDNISONE 20 MG PO TABS
40.0000 mg | ORAL_TABLET | Freq: Every day | ORAL | Status: DC
  Administered 2013-01-10: 08:00:00 40 mg via ORAL
  Filled 2013-01-09 (×2): qty 2

## 2013-01-09 MED ORDER — METHYLPREDNISOLONE SODIUM SUCC 125 MG IJ SOLR: 60.0000 mg | Freq: Two times a day (BID) | INTRAMUSCULAR | Status: DC

## 2013-01-09 MED ORDER — DEXTROSE-NACL 5-0.9 % IV SOLN
INTRAVENOUS | Status: DC
  Administered 2013-01-09: 10 mL via INTRAVENOUS

## 2013-01-09 MED ORDER — HYDRALAZINE HCL 25 MG PO TABS
25.0000 mg | ORAL_TABLET | Freq: Three times a day (TID) | ORAL | Status: DC
  Administered 2013-01-09 – 2013-01-11 (×8): 25 mg via ORAL
  Filled 2013-01-09 (×9): qty 1

## 2013-01-09 MED ORDER — METHYLPREDNISOLONE SODIUM SUCC 40 MG IJ SOLR: 40.0000 mg | Freq: Two times a day (BID) | INTRAMUSCULAR | Status: DC

## 2013-01-09 MED ORDER — FUROSEMIDE 10 MG/ML IJ SOLN
40.0000 mg | Freq: Once | INTRAMUSCULAR | Status: AC
  Administered 2013-01-09: 40 mg via INTRAVENOUS
  Filled 2013-01-09: qty 4

## 2013-01-09 NOTE — Progress Notes (Signed)
Patient ID: [name] [name], male   DOB: 06-03-1940, 72 y.o.   MRN: [id number] [facility] PROGRESS NOTE  [name] [name] GEX:[id number] DOB: Nov 25, 1940 DOA: 01/06/2013 PCP: Georgianne Fick, MD  Brief narrative: 72 y.o. male with COPD, OSA, and CHF who was brought to the ED due to worsening confusion that started 2-3 days prior to this admission. Pt has been hospitalized from 11/02 - 11/06 and per his sister his presentation appears to be similar this time. In ED, ABG revealed a CO2 level of 111, and pt was placed on BIPAP and [facility] asked to admit to ICU.   Principal Problem:   Respiratory failure with hypercapnia - acute on chronic respiratory failure secondary to decompensated OHS/OSA, cor pulmonale, diastolic CHF, imposed on acute on chronic COPD - pt is clinically improving but breath sounds are still diminished especially at bases and mild end expiratory wheezing is noted  - appreciate PCCM input - will continue with diuresis as pt able to tolerate and based on kidney function, will also continue steroids but will transition to PO this AM - will continue BD's scheduled and as needed  Active Problems:   DM type 2, uncontrolled, with renal complications - will discontinue Insulin drip this AM - place on SSI and continue long acting insulin - diabetic educator consultation request place    Acute on chronic renal failure - remains overall at baseline and trending down this AM - close monitoring while diuresing and repeat BMP in AM   Morbid obesity - with BMP > 50, nutrition consultation    HYPERTENSION - reasonable inpatient control    Acute exacerbation of chronic obstructive pulmonary disease (COPD) - management with BD's as noted above and transition to oral steroids this AM   Cor pulmonale - monitor respiratory status   Acute encephalopathy - most likely secondary to hypercarbia  - now resolved, pt at his functional baseline mental status   Acute on Chronic Diastolic CHF  -  Continue lasix.  - strict I's and O's, daily weights    Thrombocytopenia - remains stable and at baseline - CBC in AM  Consultants:  PCCM Procedures/Studies: Dg Chest Port 1 View   01/09/2013   The lungs are clear. Heart size is normal. No pneumothorax or pleural fluid.  IMPRESSION: No acute disease.   Antibiotics:  None  Code Status: Full Family Communication: Pt at bedside Disposition Plan: Transfer to telemetry bed   HPI/Subjective: No events overnight.   Objective: Filed Vitals:   01/09/13 0400 01/09/13 0415 01/09/13 0500 01/09/13 0803  BP:   133/38   Pulse: 86  90   Temp:      TempSrc:      Resp: 16     Height:      Weight:      SpO2: 96% 96% 89% 92%    Intake/Output Summary (Last 24 hours) at 01/09/13 0856 Last data filed at 01/09/13 0600  Gross per 24 hour  Intake  267.5 ml  Output   2395 ml  Net -2127.5 ml    Exam:   General:  Pt is alert, follows commands appropriately, not in acute distress  Cardiovascular: Regular rate and rhythm, S1/S2, no murmurs, no rubs, no gallops  Respiratory: Diminished breath sounds bilaterally   Abdomen: Soft, non tender, non distended, bowel sounds present, no guarding  Extremities: +1 bilateral LE pitting edema, pulses DP and PT palpable bilaterally  Neuro: Grossly nonfocal  Data Reviewed: Basic Metabolic Panel:  Recent Labs Lab 01/03/13 0530  01/04/13 0528 01/07/13 0044 01/07/13 0623 01/08/13 0348 01/09/13 0320  NA 139 143 143 141 145 143  K 5.3* 4.7 5.0 4.7 5.2* 4.4  CL 93* 95* 96 97 100 98  CO2 39* 38*  --  38* 42* 39*  GLUCOSE 333* 285* 247* 247* 260* 201*  BUN 63* 59* 76* 65* 67* 71*  CREATININE 2.83* 2.82* 3.00* 2.79* 2.47* 2.31*  CALCIUM 10.4 10.1  --  9.8 9.6 9.3  CBC:  Recent Labs Lab 01/03/13 0530 01/07/13 0035 01/07/13 0044 01/07/13 0623 01/08/13 0348 01/09/13 0320  WBC 11.6* 15.5*  --  18.8* 12.8* 13.1*  NEUTROABS  --  11.9*  --   --   --   --   HGB 12.0* 12.8* 15.3 12.3* 11.4*  12.0*  HCT 40.2 44.3 45.0 42.8 38.2* 39.0  MCV 95.3 100.0  --  99.8 96.5 93.5  PLT 84* 79*  --  59* 68* 84*   CBG:  Recent Labs Lab 01/08/13 0354 01/08/13 0836 01/08/13 1216 01/08/13 1641 01/08/13 1952  GLUCAP 233* 235* 286* 329* 395*    Recent Results (from the past 240 hour(s))  MRSA PCR SCREENING     Status: Abnormal   Collection Time    12/31/12  9:56 PM      Result Value Range Status   MRSA by PCR POSITIVE (*) NEGATIVE Final   Comment:            The GeneXpert MRSA Assay (FDA     approved for NASAL specimens     only), is one component of a     comprehensive MRSA colonization     surveillance program. It is not     intended to diagnose MRSA     infection nor to guide or     monitor treatment for     MRSA infections.     RESULT CALLED TO, READ BACK BY AND VERIFIED WITH:     CARMICHAEL,J RN @0120  ON 11.3.2014 BY MCREYNOLDS,B     Scheduled Meds: . ipratropium  0.5 mg Nebulization QID  . albuterol  2.5 mg Nebulization Q4H  . bisoprolol  5 mg Oral Daily  . furosemide  40 mg Intravenous Once  . heparin subcutaneous  5,000 Units Subcutaneous Q8H  . insulin NPH  10 Units Subcutaneous BID AC & HS  . SOLU-MEDROL inject  60 mg Intravenous Q12H   Continuous Infusions: . dextrose 5 % and 0.9% NaCl 10 mL (01/09/13 0249)  . insulin (NOVOLIN-R) infusion 13.4 mL/hr at 01/09/13 1610   Debbora Presto, MD  [facility] Pager [phone]  If 7PM-7AM, please contact night-coverage [email or web address] Password [email or web address] 01/09/2013, 8:56 AM   LOS: 3 days

## 2013-01-09 NOTE — Progress Notes (Signed)
PULMONARY  / CRITICAL CARE MEDICINE  Name: [name] [name] MRN: [id number] DOB: 1940/03/12    ADMISSION DATE:  01/06/2013 CONSULTATION DATE:  01/09/2013   REFERRING MD :  [facility] PRIMARY SERVICE: TRH  CHIEF COMPLAINT:   Resp distress  BRIEF PATIENT DESCRIPTION:  72 y.o.M with Copd, OSA, CHF readmit after just d/c two days ago from [facility] service.  Pt now in iCU on bipap.    HISTORY OF PRESENT ILLNESS:   72 y.o.M COPD OSA OHS CHF  Readmit after two days with acute on chronic resp failure.  Pt in pulm edema and severe CO2 elevation with delerium      SUBJECTIVE: Cannot tolerate bipap due to mask issue .  Afebrile Diuresing well  VITAL SIGNS: Temp:  [97.3 F (36.3 C)-98.4 F (36.9 C)] 98.4 F (36.9 C) (11/11 0006) Pulse Rate:  [65-90] 90 (11/11 0500) Resp:  [8-26] 16 (11/11 0400) BP: (133-195)/(38-83) 133/38 mmHg (11/11 0500) SpO2:  [88 %-100 %] 92 % (11/11 0803) FiO2 (%):  [40 %] 40 % (11/11 0006) HEMODYNAMICS:   VENTILATOR SETTINGS: Vent Mode:  [-] BIPAP FiO2 (%):  [40 %] 40 % Set Rate:  [15 bmp] 15 bmp PEEP:  [6 cmH20] 6 cmH20 INTAKE / OUTPUT: Intake/Output     11/10 0701 - 11/11 0700 11/11 0701 - 11/12 0700   I.V. (mL/kg) 277.5 (2.3)    Total Intake(mL/kg) 277.5 (2.3)    Urine (mL/kg/hr) 2770 (0.9)    Total Output 2770     Net -2492.5          Stool Occurrence 2 x      PHYSICAL EXAMINATION: General: awake and follows commands . Grumpy Neuro:  alert HEENT:  No jvd. Cardiovascular:  RRR nl s1 s2 no s3/ s4 Lungs:  Distant BS Abdomen:  Soft nt, obese Musculoskeletal:  intact Skin:  clear  LABS:  CBC  Recent Labs Lab 01/07/13 0623 01/08/13 0348 01/09/13 0320  WBC 18.8* 12.8* 13.1*  HGB 12.3* 11.4* 12.0*  HCT 42.8 38.2* 39.0  PLT 59* 68* 84*   Coag's No results found for this basename: APTT, INR,  in the last 168 hours BMET  Recent Labs Lab 01/07/13 0623 01/08/13 0348 01/09/13 0320  NA 141 145 143  K 4.7 5.2* 4.4  CL 97 100 98  CO2 38*  42* 39*  BUN 65* 67* 71*  CREATININE 2.79* 2.47* 2.31*  GLUCOSE 247* 260* 201*   Electrolytes  Recent Labs Lab 01/07/13 0623 01/08/13 0348 01/09/13 0320  CALCIUM 9.8 9.6 9.3   Sepsis Markers No results found for this basename: LATICACIDVEN, PROCALCITON, O2SATVEN,  in the last 168 hours ABG  Recent Labs Lab 01/07/13 0330 01/07/13 0730 01/08/13 0838  PHART 7.229* 7.255* 7.362  PCO2ART 103.0* 94.7* 75.6*  PO2ART 54.4* 62.2* 103.0*   Liver Enzymes No results found for this basename: AST, ALT, ALKPHOS, BILITOT, ALBUMIN,  in the last 168 hours Cardiac Enzymes  Recent Labs Lab 01/07/13 0035  PROBNP 488.7*   Glucose  Recent Labs Lab 01/08/13 0056 01/08/13 0354 01/08/13 0836 01/08/13 1216 01/08/13 1641 01/08/13 1952  GLUCAP 235* 233* 235* 286* 329* 395*    Imaging Dg Chest Port 1 View  01/09/2013   CLINICAL DATA:  Respiratory failure.  EXAM: PORTABLE CHEST - 1 VIEW  COMPARISON:  Single view of the chest 01/07/2013 and 12/31/2012.  FINDINGS: The lungs are clear. Heart size is normal. No pneumothorax or pleural fluid.  IMPRESSION: No acute disease.   Electronically Signed  By: Drusilla Kanner M.D.   On: 01/09/2013 07:49     CXR:see vabove  ASSESSMENT / PLAN:  PULMONARY A:Acute on Chronic respiratory failure (multi-factorial): in setting of decompensated OHS/OSA , cor pulmonale, and decompensated diastolic HF, super-imposed on underlying GOLD st D COPD +/- mild AECOPD and post-nasal gtt. .  Readmission <2days. Acute on chronic resp failure and pulme edema  P:   bipap adjusted, change to prn and qhs 11-10,allow him to use his own FF mask Diurese as tolerated  changeto oral prednisone PT  CARDIOVASCULAR A: HTN, HL P:  Diurese 11-11 lasix ordered  zebeta Consider hydralazine for BP  RENAL Lab Results  Component Value Date   CREATININE 2.31* 01/09/2013   CREATININE 2.47* 01/08/2013   CREATININE 2.79* 01/07/2013   CREATININE 1.88* 05/25/2012    CREATININE 1.75* 04/25/2012   CREATININE 1.77* 08/30/2011    ASSESSMENT: A:  CKD , met alkalosis with resp acidosis P:   Lasix x 1 11-11 Monitor May need renal consult   ENDOCRINE CBG (last 3)   Recent Labs  01/08/13 1216 01/08/13 1641 01/08/13 1952  GLUCAP 286* 329* 395*     A:  DM2 with hyperglycemia and renal dz   P:   Lowering steroid dose should enable coming off insulin gtt   NEUROLOGIC A:  AMS d/t hypercarbia P:   Monitor D/c all narcotics/sedatives  TODAY'S SUMMARY:  Unable to tolerate bipap. He has old style full space shield face mask at home, will have family bring it in for him to wear. OK to transfer out of SDU once off insulin gtt  Cyril Mourning MD. [facility]. [place] [facility] Pager [phone] If no response call [phone]   01/09/2013, 8:38 AM

## 2013-01-09 NOTE — Progress Notes (Signed)
Placed patient on BIPAP  With home settings of 20/6. Patient is tolerating well at this time. RT reminded patient how important it was for him to wear this at night. Will continue to monitor.

## 2013-01-09 NOTE — Progress Notes (Signed)
Pt recd from ICU, no change from AM assessment, pt pleasant, c/o of dry throat, ice chips given

## 2013-01-09 NOTE — Progress Notes (Addendum)
Inpatient Diabetes Program Recommendations  AACE/ADA: New Consensus Statement on Inpatient Glycemic Control (2013)  Target Ranges:  Prepandial:   less than 140 mg/dL      Peak postprandial:   less than 180 mg/dL (1-2 hours)      Critically ill patients:  140 - 180 mg/dL   Reason for Visit:  Consultation from MD for assessment and Outpatient Education Order  Note:  Was on insulin drip last night due to elevation in glucose from solu-medrol.  Did not receive NPH last night and this morning due to insulin drip.  Current order is for NPH 10 units BID with breakfast and at HS.  May benefit from home dose of NPH which is 20 units every morning and 22 units at bedtime.   Currently on moderate corrections scale and 4 units Novolog as meal coverage tid with meals.  Home dose of Novolog is 20 units tid with meals.  May benefit from increasing meal coverage to 6 units tid with meals.  Sister, Lynden Oxford, is at bedside.  She states that she is his primary caregiver-- but she works.  Concerned that patient comes to hospital, gets better, goes back home but then "slips back down" because he gets tired from not breathing properly-- then is unable to care for self.  She had gotten Home Health started just before he was re-admitted, but she states that he had to come back in just as they were getting started.  She states that she thinks he needs to go for rehab at a skilled nursing facility when discharged this time.  She would like to be part of the conversation when this is discussed with him since he is really not able to care adequately for himself.   Patient gives his own insulin at home.  In my discussion with him, he seems to have his insulins mixed up.  He thinks the Novolog is cloudy and the N is clear.  Says he takes the "orange" one twice a day and the other one three times a day.  The "orange" one is his Novolog.    Not appropriate for Outpatient Diabetes Education Follow-up after discharge because he  is homebound.  Thank you.  [name] [name] S. Elsie Lincoln, RN, CNS, CDE Inpatient Diabetes Program, team pager [phone]   Addendum:  CBG before supper 320 mg/dl.  May benefit from getting evening dose of NPH now-- at home dose of 22 units, then tomorrow give NPH 20 units at breakfast and 22 units at HS as at home.  Will place consult for social worker to explore possible SNF placement for rehab per sister request.  Thank you.  [name] [name] S. Elsie Lincoln, RN, CNS, CDE Inpatient Diabetes Program, team pager [phone]

## 2013-01-10 ENCOUNTER — Inpatient Hospital Stay (HOSPITAL_COMMUNITY): Payer: Medicare Other

## 2013-01-10 ENCOUNTER — Inpatient Hospital Stay: Payer: Medicare Other | Admitting: Adult Health

## 2013-01-10 LAB — BASIC METABOLIC PANEL
BUN: 73 mg/dL — ABNORMAL HIGH (ref 6–23)
Calcium: 9.3 mg/dL (ref 8.4–10.5)
Chloride: 93 mEq/L — ABNORMAL LOW (ref 96–112)
GFR calc Af Amer: 27 mL/min — ABNORMAL LOW (ref >90)
Potassium: 3.9 mEq/L (ref 3.5–5.1)

## 2013-01-10 LAB — GLUCOSE, CAPILLARY
Glucose-Capillary: 210 mg/dL — ABNORMAL HIGH (ref 70–99)
Glucose-Capillary: 227 mg/dL — ABNORMAL HIGH (ref 70–99)
Glucose-Capillary: 253 mg/dL — ABNORMAL HIGH (ref 70–99)
Glucose-Capillary: 295 mg/dL — ABNORMAL HIGH (ref 70–99)
Glucose-Capillary: 315 mg/dL — ABNORMAL HIGH (ref 70–99)

## 2013-01-10 LAB — CBC
HCT: 41.3 % (ref 39.0–52.0)
Hemoglobin: 12.6 g/dL — ABNORMAL LOW (ref 13.0–17.0)
RDW: 14.3 % (ref 11.5–15.5)
WBC: 13.2 10*3/uL — ABNORMAL HIGH (ref 4.0–10.5)

## 2013-01-10 MED ORDER — PREDNISONE 20 MG PO TABS
20.0000 mg | ORAL_TABLET | Freq: Every day | ORAL | Status: DC
Start: 2013-01-11 — End: 2013-01-11
  Administered 2013-01-11: 06:00:00 20 mg via ORAL
  Filled 2013-01-10 (×2): qty 1

## 2013-01-10 MED ORDER — SODIUM CHLORIDE 0.9 % IJ SOLN
3.0000 mL | Freq: Two times a day (BID) | INTRAMUSCULAR | Status: DC
  Administered 2013-01-10 – 2013-01-11 (×2): 3 mL via INTRAVENOUS

## 2013-01-10 NOTE — Care Management Note (Addendum)
    Page 1 of 1   01/11/2013     12:03:14 PM   CARE MANAGEMENT NOTE 01/11/2013  Patient:  [name] [name], [name] [name]   Account Number:  [id number]  Date Initiated:  01/07/2013  Documentation initiated by:  [facility]  Subjective/Objective Assessment:   72 year old male admitted with respiratory failure.     Action/Plan:   From home.   Anticipated DC Date:  01/11/2013   Anticipated DC Plan:  SKILLED NURSING FACILITY      DC Planning Services  CM consult      Choice offered to / List presented to:             Status of service:  Completed, signed off Medicare Important Message given?  NA - LOS <3 / Initial given by admissions (If response is "NO", the following Medicare IM given date fields will be blank) Date Medicare IM given:   Date Additional Medicare IM given:    Discharge Disposition:  SKILLED NURSING FACILITY  Per UR Regulation:  Reviewed for med. necessity/level of care/duration of stay  If discussed at Long Length of Stay Meetings, dates discussed:   01/11/2013    Comments:  01/11/13 Satcha Storlie RN,BSN NCM [phone] D/C SNF.  01/10/13 Lerin Jech RN,BSN NCM [phone] TRANSFER FROM SDU.RESP FAILURE,HYPERCAPNEA.BIPAP,IV LASIX.IV SOLUMEDROL.AWAIT PT/OT CONS.NOTED PER [facility] PATIENT HAS NO FOOD IN HOME OR ANYONE TO GET FOOD.LIVES HOME ALONE.ACTIVE W/THN.WILL CONTINUE TO FOLLOW.

## 2013-01-10 NOTE — Progress Notes (Signed)
PULMONARY  / CRITICAL CARE MEDICINE  Name: [name] [name] MRN: [id number] DOB: 02-Oct-1940    ADMISSION DATE:  01/06/2013 CONSULTATION DATE:  01/10/2013   REFERRING MD :  [facility] PRIMARY SERVICE: TRH  CHIEF COMPLAINT:   Resp distress  BRIEF PATIENT DESCRIPTION:   72 y.o.M COPD OSA OHS CHF  Readmit after two days with acute on chronic resp failure.  Pt in pulm edema and severe CO2 elevation with delerium   Developed hyperglycemia requiring insulin gtt with Iv steroids    SUBJECTIVE: Finally got a mask that fits better Afebrile Diuresing well with lasix  VITAL SIGNS: Temp:  [98 F (36.7 C)-98.3 F (36.8 C)] 98.3 F (36.8 C) (11/12 0819) Pulse Rate:  [67-81] 78 (11/12 0819) Resp:  [19-27] 20 (11/12 0819) BP: (119-153)/(24-70) 122/62 mmHg (11/12 0819) SpO2:  [92 %-100 %] 96 % (11/12 0819) HEMODYNAMICS:   VENTILATOR SETTINGS:   INTAKE / OUTPUT: Intake/Output     11/11 0701 - 11/12 0700 11/12 0701 - 11/13 0700   P.O. 1160    I.V. (mL/kg) 41.1 (0.3)    Total Intake(mL/kg) 1201.1 (9.9)    Urine (mL/kg/hr) 2030 (0.7)    Total Output 2030     Net -828.9            PHYSICAL EXAMINATION: General: awake and follows commands .  Neuro:  Alert, non focal HEENT:  No jvd. Cardiovascular:  RRR nl s1 s2 no s3/ s4 Lungs:  Distant BS Abdomen:  Soft nt, obese Musculoskeletal:  intact Skin:  clear  LABS:  CBC  Recent Labs Lab 01/08/13 0348 01/09/13 0320 01/10/13 0454  WBC 12.8* 13.1* 13.2*  HGB 11.4* 12.0* 12.6*  HCT 38.2* 39.0 41.3  PLT 68* 84* 87*   Coag's No results found for this basename: APTT, INR,  in the last 168 hours BMET  Recent Labs Lab 01/08/13 0348 01/09/13 0320 01/10/13 0454  NA 145 143 137  K 5.2* 4.4 3.9  CL 100 98 93*  CO2 42* 39* 36*  BUN 67* 71* 73*  CREATININE 2.47* 2.31* 2.58*  GLUCOSE 260* 201* 298*   Electrolytes  Recent Labs Lab 01/08/13 0348 01/09/13 0320 01/10/13 0454  CALCIUM 9.6 9.3 9.3   Sepsis Markers No results  found for this basename: LATICACIDVEN, PROCALCITON, O2SATVEN,  in the last 168 hours ABG  Recent Labs Lab 01/07/13 0330 01/07/13 0730 01/08/13 0838  PHART 7.229* 7.255* 7.362  PCO2ART 103.0* 94.7* 75.6*  PO2ART 54.4* 62.2* 103.0*   Liver Enzymes No results found for this basename: AST, ALT, ALKPHOS, BILITOT, ALBUMIN,  in the last 168 hours Cardiac Enzymes  Recent Labs Lab 01/07/13 0035  PROBNP 488.7*   Glucose  Recent Labs Lab 01/09/13 0747 01/09/13 0856 01/09/13 1236 01/09/13 1608 01/09/13 2206 01/10/13 0737  GLUCAP 253* 295* 190* 320* 266* 286*    Imaging Dg Chest Port 1 View  01/10/2013   CLINICAL DATA:  Respiratory distress.  EXAM: PORTABLE CHEST - 1 VIEW  COMPARISON:  01/09/2013, 01/07/2013, and 12/31/2012  FINDINGS: There is new peribronchial thickening at the right lung base. No consolidative infiltrates or effusions. Heart size and vascularity are normal. No osseous abnormality.  IMPRESSION: New bronchitic changes at the right base.   Electronically Signed   By: Geanie Cooley M.D.   On: 01/10/2013 07:45   Dg Chest Port 1 View  01/09/2013   CLINICAL DATA:  Respiratory failure.  EXAM: PORTABLE CHEST - 1 VIEW  COMPARISON:  Single view of the chest 01/07/2013 and  12/31/2012.  FINDINGS: The lungs are clear. Heart size is normal. No pneumothorax or pleural fluid.  IMPRESSION: No acute disease.   Electronically Signed   By: Drusilla Kanner M.D.   On: 01/09/2013 07:49     CXR:see vabove  ASSESSMENT / PLAN:  PULMONARY A:Acute on Chronic respiratory failure (multi-factorial): in setting of decompensated OHS/OSA , cor pulmonale, and decompensated diastolic HF, super-imposed on underlying GOLD st D COPD +/- mild AECOPD  .  Readmission <2days. Acute on chronic resp failure and pulme edema  P:   bipap 20/6 with FF mask qhs Diurese as tolerated  changeto 20 mg prednisone with rapid taper over 1 week O2 4L at baseline  CARDIOVASCULAR A: HTN, HL P:   zebeta hydralazine for BP  RENAL Lab Results  Component Value Date   CREATININE 2.58* 01/10/2013   CREATININE 2.31* 01/09/2013   CREATININE 2.47* 01/08/2013   CREATININE 1.88* 05/25/2012   CREATININE 1.75* 04/25/2012   CREATININE 1.77* 08/30/2011    ASSESSMENT: A:  AKI on  CKD , met alkalosis with resp acidosis P:   Lasix 80 bid, may have to drop to q day May need renal consult   ENDOCRINE CBG (last 3)   Recent Labs  01/09/13 1608 01/09/13 2206 01/10/13 0737  GLUCAP 320* 266* 286*     A:  DM2 with hyperglycemia and renal dz   P:   Lowering steroid dose should help with sugars   NEUROLOGIC A:  AMS d/t hypercarbia P:   Monitor D/c all narcotics/sedatives  TODAY'S SUMMARY: His resp acidosis is compounded by poor renal compensation , high risk for re-admission Would need care management input & HHPT PCCM to sign off   Cyril Mourning MD. Tonny Bollman. [facility] Pager [phone] If no response call [phone]   01/10/2013, 9:40 AM

## 2013-01-10 NOTE — Progress Notes (Signed)
[facility] PROGRESS NOTE  TU BAYLE WUX:[id number] DOB: November 21, 1940 DOA: 01/06/2013 PCP: Georgianne Fick, MD  Assessment/Plan: Acute on Chronic Respiratory failure with hypercapnia  -multifactorial  secondary to decompensated OHS/OSA, cor pulmonale, diastolic CHF, imposed on acute on chronic COPD  - pt is clinically improving but breath sounds are still diminished - appreciate PCCM input  - will continue with diuresis as pt able to tolerate and based on kidney function, will also continue steroids  -wean prednisone 20 mg over the next week - will continue BD's scheduled and as needed  -remain on 4L [place], which is his home requirement Active Problems:  DM type 2, uncontrolled, with renal complications  - will discontinue Insulin drip 11.11.14 - place on SSI and continue long acting insulin  - increase NPH to home dose Acute on chronic renal failure  - remains overall at baseline and trending down this AM  - close monitoring while diuresing and repeat BMP in AM  -Baseline creatinine 2.5-2.8 -Suspect a component of cardiorenal syndrome Morbid obesity  - with BMP > 50, nutrition consultation  HYPERTENSION  - reasonable inpatient control  Acute exacerbation of chronic obstructive pulmonary disease (COPD)  - management with BD's as noted above  -Continue by mouth prednisone and weaned within the next week Cor pulmonale  - monitor respiratory status  Acute encephalopathy  - most likely secondary to hypercarbia  - now resolved, pt at his functional baseline mental status  Acute on Chronic Diastolic CHF  - Continue lasix.  - strict I's and O's, daily weights  -neg 5.5 liters for the admission -Daily weights Thrombocytopenia  - remains stable and at baseline  - CBC in AM Deconditioning -Physical therapy recommends a skilled nursing facility     Family Communication:   Sister at beside Disposition Plan:   SNF 11/13 or 11/14     Procedures/Studies: Nm Pulmonary  Perf And Vent  01/01/2013   CLINICAL DATA:  Short of breath, elevated BUN and creatinine.  EXAM: NUCLEAR MEDICINE VENTILATION - PERFUSION LUNG SCAN  TECHNIQUE: Ventilation images were obtained in multiple projections using inhaled aerosol technetium 99 M DTPA. Perfusion images were obtained in multiple projections after intravenous injection of Tc-43m MAA.  COMPARISON:  None.  RADIOPHARMACEUTICALS:  40.3 mCi Tc-71m DTPA aerosol and 5.09 mCi Tc-38m MAA  FINDINGS: Ventilation: There is significant heterogeneity of ventilation without focal defect.  Perfusion: There is mild heterogeneity of perfusion which matches the heterogeneity of the ventilation. No wedge-shaped peripheral perfusion defects.  IMPRESSION: Very low probability for acute pulmonary embolism. Heterogeneous perfusion and ventilation consistent with COPD.   Electronically Signed   By: Genevive Bi M.D.   On: 01/01/2013 15:32   Dg Chest Port 1 View  01/10/2013   CLINICAL DATA:  Respiratory distress.  EXAM: PORTABLE CHEST - 1 VIEW  COMPARISON:  01/09/2013, 01/07/2013, and 12/31/2012  FINDINGS: There is new peribronchial thickening at the right lung base. No consolidative infiltrates or effusions. Heart size and vascularity are normal. No osseous abnormality.  IMPRESSION: New bronchitic changes at the right base.   Electronically Signed   By: Geanie Cooley M.D.   On: 01/10/2013 07:45   Dg Chest Port 1 View  01/09/2013   CLINICAL DATA:  Respiratory failure.  EXAM: PORTABLE CHEST - 1 VIEW  COMPARISON:  Single view of the chest 01/07/2013 and 12/31/2012.  FINDINGS: The lungs are clear. Heart size is normal. No pneumothorax or pleural fluid.  IMPRESSION: No acute disease.   Electronically Signed  By: Drusilla Kanner M.D.   On: 01/09/2013 07:49   Dg Chest Portable 1 View  01/07/2013   CLINICAL DATA:  Wheezing.  EXAM: PORTABLE CHEST - 1 VIEW  COMPARISON:  12/31/2012  FINDINGS: The heart size and mediastinal contours are within normal limits.  Pulmonary vascular congestion noted. No airspace consolidation. The visualized skeletal structures are unremarkable.  IMPRESSION: Pulmonary vascular congestion.   Electronically Signed   By: Signa Kell M.D.   On: 01/07/2013 01:25   Dg Chest Portable 1 View  12/31/2012   CLINICAL DATA:  Shortness of breath.  EXAM: PORTABLE CHEST - 1 VIEW  COMPARISON:  06/21/2012.  FINDINGS: 1705 hr. Lordotic positioning. The heart size and mediastinal contours are normal. The lungs are clear. There is no pleural effusion or pneumothorax. No acute osseous findings are identified. Telemetry leads overlie the chest.  IMPRESSION: No active cardiopulmonary process.   Electronically Signed   By: Roxy Horseman M.D.   On: 12/31/2012 17:19         Subjective: Patient states that he is breathing 75% better. Denies fevers, chills, chest discomfort, nausea, vomiting, diarrhea, abdominal pain. No dysuria or hematuria.  Objective: Filed Vitals:   01/10/13 0200 01/10/13 0600 01/10/13 0744 01/10/13 0819  BP: 126/54 119/58  122/62  Pulse: 76 67  78  Temp: 98.1 F (36.7 C) 98.1 F (36.7 C)  98.3 F (36.8 C)  TempSrc: Axillary Oral  Oral  Resp: 19   20  Height:      Weight:      SpO2: 100% 94% 96% 96%    Intake/Output Summary (Last 24 hours) at 01/10/13 1404 Last data filed at 01/10/13 0700  Gross per 24 hour  Intake    660 ml  Output   1250 ml  Net   -590 ml   Weight change:  Exam:   General:  Pt is alert, follows commands appropriately, not in acute distress  HEENT: No icterus, No thrush,[place]/AT  Cardiovascular: RRR, S1/S2, no rubs, no gallops  Respiratory: Diminished breath sounds at the bases. No wheezing. Good air movement.  Abdomen: Soft/+BS, non tender, non distended, no guarding  Extremities: trace edema, No lymphangitis, No petechiae, No rashes, no synovitis  Data Reviewed: Basic Metabolic Panel:  Recent Labs Lab 01/04/13 0528 01/07/13 0044 01/07/13 0623 01/08/13 0348 01/09/13 0320  01/10/13 0454  NA 143 143 141 145 143 137  K 4.7 5.0 4.7 5.2* 4.4 3.9  CL 95* 96 97 100 98 93*  CO2 38*  --  38* 42* 39* 36*  GLUCOSE 285* 247* 247* 260* 201* 298*  BUN 59* 76* 65* 67* 71* 73*  CREATININE 2.82* 3.00* 2.79* 2.47* 2.31* 2.58*  CALCIUM 10.1  --  9.8 9.6 9.3 9.3   Liver Function Tests: No results found for this basename: AST, ALT, ALKPHOS, BILITOT, PROT, ALBUMIN,  in the last 168 hours No results found for this basename: LIPASE, AMYLASE,  in the last 168 hours No results found for this basename: AMMONIA,  in the last 168 hours CBC:  Recent Labs Lab 01/07/13 0035 01/07/13 0044 01/07/13 0623 01/08/13 0348 01/09/13 0320 01/10/13 0454  WBC 15.5*  --  18.8* 12.8* 13.1* 13.2*  NEUTROABS 11.9*  --   --   --   --   --   HGB 12.8* 15.3 12.3* 11.4* 12.0* 12.6*  HCT 44.3 45.0 42.8 38.2* 39.0 41.3  MCV 100.0  --  99.8 96.5 93.5 94.1  PLT 79*  --  59* 68*  84* 87*   Cardiac Enzymes: No results found for this basename: CKTOTAL, CKMB, CKMBINDEX, TROPONINI,  in the last 168 hours BNP: No components found with this basename: POCBNP,  CBG:  Recent Labs Lab 01/09/13 1236 01/09/13 1608 01/09/13 2206 01/10/13 0737 01/10/13 1307  GLUCAP 190* 320* 266* 286* 315*    Recent Results (from the past 240 hour(s))  MRSA PCR SCREENING     Status: Abnormal   Collection Time    12/31/12  9:56 PM      Result Value Range Status   MRSA by PCR POSITIVE (*) NEGATIVE Final   Comment:            The GeneXpert MRSA Assay (FDA     approved for NASAL specimens     only), is one component of a     comprehensive MRSA colonization     surveillance program. It is not     intended to diagnose MRSA     infection nor to guide or     monitor treatment for     MRSA infections.     RESULT CALLED TO, READ BACK BY AND VERIFIED WITH:     CARMICHAEL,J RN @0120  ON 11.3.2014 BY MCREYNOLDS,B     Scheduled Meds: . ipratropium  0.5 mg Nebulization QID   And  . albuterol  2.5 mg Nebulization Q4H   . albuterol  5 mg Nebulization Once  . antiseptic oral rinse  15 mL Mouth Rinse q12n4p  . bisoprolol  5 mg Oral Daily  . chlorhexidine  15 mL Mouth Rinse BID  . Chlorhexidine Gluconate Cloth  6 each Topical Q0600  . furosemide  80 mg Oral BID  . heparin subcutaneous  5,000 Units Subcutaneous Q8H  . hydrALAZINE  25 mg Oral TID  . insulin aspart  0-15 Units Subcutaneous TID WC  . insulin aspart  4 Units Subcutaneous TID WC  . insulin NPH  10 Units Subcutaneous BID AC & HS  . mupirocin ointment  1 application Nasal BID  . [START ON 01/11/2013] predniSONE  20 mg Oral Q breakfast   Continuous Infusions:    Daevon Holdren, DO  [facility] Pager [phone]  If 7PM-7AM, please contact night-coverage [email or web address] Password [email or web address] 01/10/2013, 2:04 PM   LOS: 4 days

## 2013-01-10 NOTE — Progress Notes (Signed)
Inpatient Diabetes Program Recommendations  AACE/ADA: New Consensus Statement on Inpatient Glycemic Control (2013)  Target Ranges:  Prepandial:   less than 140 mg/dL      Peak postprandial:   less than 180 mg/dL (1-2 hours)      Critically ill patients:  140 - 180 mg/dL   Reason for Visit: Results for AARONJAMES, KELSAY (MRN [id number]) as of 01/10/2013 13:48  Ref. Range 01/09/2013 12:36 01/09/2013 16:08 01/09/2013 22:06 01/10/2013 07:37 01/10/2013 13:07  Glucose-Capillary Latest Range: 70-99 mg/dL 130 (H) 865 (H) 784 (H) 286 (H) 315 (H)   Please increase NPH to home dose of 20 units q AM and 22 units at bedtime.  Thanks, Beryl Meager, RN, BC-ADM Inpatient Diabetes Coordinator Pager [phone]

## 2013-01-10 NOTE — Progress Notes (Signed)
Patient ID: [name] [name], male   DOB: Aug 28, 1940, 72 y.o.   MRN: [id number]  STARMOUNT  No Known Allergies  Chief Complaint  Patient presents with  . Discharge Note    HPI He is being discharged to home after his admission to this facility for short term rehab due to copd exacerbation. he will need home health for pt/ot/nursing.he will need  The following dme in order for him to maintain his level of independence 3:1 commode; reacher; walker ray (folda away) and shower bench. He will need prescriptions written.  Past Medical History  Diagnosis Date  . Renal insufficiency   . Diabetes mellitus   . GERD (gastroesophageal reflux disease)   . Gastroparesis   . Esophageal stricture   . Hyperlipemia   . Hypertension   . Pancreatitis   . Chronic respiratory disease   . Depression   . Seizure disorder   . Arthritis   . Diverticulosis   . Sleep apnea     CPAP Machine  . Obesity   . ED (erectile dysfunction)   . Sleep apnea   . COPD (chronic obstructive pulmonary disease)     Gold Stage  III/IV  . H/O polycythemia   . Chronic kidney disease (CKD), stage III (moderate)   . DDD (degenerative disc disease), lumbar   . History of esophageal stricture   . Erectile dysfunction   . Peptic ulcer disease   . History of colon polyps   . Gout   . Gastroparesis   . Acute encephalopathy 06/20/2012  . 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 CHF (congestive heart failure) 05/19/2012  . DIVERTICULOSIS, COLON 08/05/2009    Qualifier: Diagnosis of  By: Koleen Distance CMA (AAMA), Hulan Saas    . Obesity hypoventilation syndrome 04/28/2011  . OSA (obstructive sleep apnea) 09/22/2006    Auto 2013:  Optimal pressure 13/10   . SEIZURE DISORDER 09/22/2006    Qualifier: Diagnosis of  By: Nena Jordan   . THROMBOCYTOPENIA 04/17/2007    Chronic, Qualifier: Diagnosis of  By: Thereasa Solo      Past Surgical History  Procedure Laterality  Date  . Prostate surgery    . Coronary artery bypass graft      x 4  . Bone marrow biopsy    . Cardiac catheterization  05/02/2008  . Back surgery      lumbar    Filed Vitals:   08/01/12 1310  BP: 132/74  Pulse: 80  Height: 5\' 7"  (1.702 m)  Weight: 242 lb (109.77 kg)    Patient's Medications  New Prescriptions   No medications on file  Previous Medications   ACCU-CHEK AVIVA PLUS TEST STRIP    Use as directed to check blood glucose   ACCU-CHEK SOFTCLIX LANCETS LANCETS    Use as directed to check blood glucose   ACETAMINOPHEN (TYLENOL) 500 MG TABLET    Take 500 mg by mouth every 6 (six) hours as needed for pain ("per bottle").   ALBUTEROL (PROVENTIL) (2.5 MG/3ML) 0.083% NEBULIZER SOLUTION    Take 6 mLs (5 mg total) by nebulization 4 (four) times daily. Dx 496   BISOPROLOL (ZEBETA) 5 MG TABLET    Take 1 tablet (5 mg total) by mouth 2 (two) times daily.   BUDESONIDE (PULMICORT) 0.25 MG/2ML NEBULIZER SOLUTION    Take 2 mLs (0.25 mg total) by nebulization 4 (four) times daily.   CALCITRIOL (ROCALTROL) 0.25  MCG CAPSULE    Take 1 capsule (0.25 mcg total) by mouth daily.   FUROSEMIDE (LASIX) 40 MG TABLET    Take 3 tablet - 120 mg daily in am an 2 tab 80 mg in evening   INSULIN ASPART (NOVOLOG) 100 UNIT/ML INJECTION Ac meals for cbg >=150   INSULIN GLARGINE (LANTUS) 100 UNIT/ML INJECTION    Inject 0.2 mLs (20 Units total) into the skin at bedtime.   IPRATROPIUM (ATROVENT) 0.02 % NEBULIZER SOLUTION    Take 2.5 mLs (0.5 mg total) by nebulization 4 (four) times daily. DX: 496   LEVOFLOXACIN (LEVAQUIN) 750 MG TABLET    Take 1 tablet (750 mg total) by mouth every other day.   PANTOPRAZOLE (PROTONIX) 40 MG TABLET    Take 40 mg by mouth daily.   PARICALCITOL (ZEMPLAR) 2 MCG CAPSULE    Take 2 mcg by mouth every other day.   SIMVASTATIN (ZOCOR) 40 MG TABLET    Take 40 mg by mouth at bedtime.   Modified Medications   Labs reviewed.    Review of Systems:  Review of Systems  Respiratory: Positive  for cough, shortness of breath and wheezing improved.   Cardiovascular: Negative for chest pain.  Gastrointestinal: Negative for abdominal pain.  Genitourinary: Negative for frequency.  Musculoskeletal: Negative for falls.   Physical Exam  Constitutional: He appears well-developed and well-nourished.  Head: Normocephalic and atraumatic Mouth: no erythema or exudates, MMM Eyes: PERRL, EOMI, conjunctivae normal, No scleral icterus.  Neck: Supple, Trachea midline normal ROM, No JVD, mass, thyromegaly, or carotid bruit present.  Cardiovascular: RRR, S1 normal, S2 normal, no MRG, pulses symmetric and intact bilaterally Pulmonary/Chest: CTAB, no wheezes, rales, or rhonchi Abdominal: Soft. Non-tender, non-distended, bowel sounds are normal, no masses, organomegaly, or guarding present.  Musculoskeletal: No joint deformities, erythema, or stiffness, ROM full and no nontender Ext: trace edema and no cyanosis, pulses palpable bilaterally (DP and PT) Neurological: A&O x3, Strenght is normal and symmetric bilaterally, cranial nerve II-XII are grossly intact, no focal motor deficit, sensory intact to light touch bilaterally.  Skin: Warm, dry and intact. No rash, cyanosis, or clubbing.  Psychiatric: Normal mood and affect. speech and behavior is normal. Judgment and thought content normal. Cognition and memory are normal.  Plan:  ASSESSMENT/PLAN Will discharge him to home with home health for pt/ot/nursing will need the following dme: 3:1 commode; reacher; walker tray (fold away) shower bench; prescriptions written  Time spent with patient 40 minutes.

## 2013-01-10 NOTE — Progress Notes (Signed)
  RD consulted for diet education and for assessment of nutritional status/requirements.  Body mass index is 40.77 kg/(m^2). Pt meets criteria for Morbid Obesity based on current BMI. Back in May, pt reported usual body weight to be 245-255 lbs. Current weight is 268 lbs which pt states is a normal weight for him. Pt's sister in the room reports that pt has been eating more sweets recently.   Pt refused diabetes diet education but, willing to discuss general healthful eating and low sodium diet. RD provided "Low Sodium Nutrition Therapy", "Weight Loss Tips", and "1800 calorie 5-day Sample Menus" handouts from the Academy of Nutrition and Dietetics.   Reviewed patient's dietary recall. Provided examples on ways to decrease sodium intake in diet. Discouraged intake of processed foods and use of salt shaker. Pt reports eating bacon every morning; pt expresses desire to decrease intake of bacon. Encouraged fresh fruits and vegetables as well as whole grain sources of carbohydrates to maximize fiber intake.   Emphasized the importance of serving sizes and provided examples of correct portions of common foods. Discussed importance of controlled and consistent intake throughout the day. Provided examples of ways to balance meals/snacks and encouraged intake of vegetables and fruit daily. Emphasized the importance of hydration with calorie-free beverages and avoiding sugar-sweetened beverages.  Patient came up with 4 goals: Pt listed at least 5 vegetables that he likes and states he will try to eat one serving of vegetables daily.  Pt states that he will start drinking 1 glass of water before eating in an effort to eat less.  Pt states that he will continue to eat fresh fruit daily. Pt states he will stop eating bacon daily and will aim for less than 4 times per week.  Teach back method used.Expect good compliance.  Current diet order is Carb Modified, patient is consuming approximately 100% of meals at  this time. Labs and medications reviewed. No further nutrition interventions warranted at this time. RD contact information provided; encouraged pt to contact RD with any questions or concerns. If additional nutrition issues arise, please re-consult RD.  Ian Malkin RD, LDN Inpatient Clinical Dietitian Pager: [phone] After Hours Pager: [phone]

## 2013-01-10 NOTE — Evaluation (Signed)
I have reviewed this note and agree with all findings. Kati Jarry Manon, PT, DPT Pager: [phone]   

## 2013-01-10 NOTE — Evaluation (Signed)
Physical Therapy Evaluation Patient Details Name: [name] [name] MRN: [id number] DOB: 01-24-1941 Today's Date: 01/10/2013 Time: 4098-1191 PT Time Calculation (min): 19 min  PT Assessment / Plan / Recommendation History of Present Illness  Pt is a 72 y.o.M COPD OSA OHS CHF  Readmit after two days with acute on chronic resp failure.  Pt in pulm edema and severe CO2 elevation with delerium     Clinical Impression  Pt admitted with above. Pt currently presenting with functional limitations due to deficits listed below (see PT Problem List). Pt would benefit from skilled PT to increase independence and safety during mobility. Pt able to ambulate 120' with RW and min guard to day, pt limited by fatigue and decreased endurance. PT discussed recommendation of ST-SNF with pt and pt's sister, pt seems agreeable.    PT Assessment  Patient needs continued PT services    Follow Up Recommendations  SNF;Supervision for mobility/OOB    Does the patient have the potential to tolerate intense rehabilitation      Barriers to Discharge Decreased caregiver support      Equipment Recommendations  None recommended by PT    Recommendations for Other Services     Frequency Min 3X/week    Precautions / Restrictions Precautions Precautions: Fall Precaution Comments: pt uses O2 at home Restrictions Weight Bearing Restrictions: No   Pertinent Vitals/Pain No c/o pain during session.      Mobility  Bed Mobility Bed Mobility: Supine to Sit;Sitting - Scoot to Edge of Bed Supine to Sit: 4: Min guard Sitting - Scoot to [employer] of Bed: 5: Supervision Details for Bed Mobility Assistance: min guard to supervision to ensure safety, pt required increased time during all mobilty due to fatigue. Transfers Transfers: Sit to Stand;Stand to [employer] Transfers Sit to Stand: 4: Min assist;From bed;With upper extremity assist Stand to Sit: 4: Min assist;With upper extremity assist;With armrests;To  chair/3-in-1;To bed Stand Pivot Transfers: 4: Min guard Details for Transfer Assistance: min assist during first sit to stand to rise and steady with pt progressing to min guard during second trial. VC's for hand placement. min guard during stand pivot to ensure safety and cues for proper transfer sequence with RW. Ambulation/Gait Ambulation/Gait Assistance: 4: Min guard Ambulation Distance (Feet): 120 Feet Assistive device: Rolling walker Ambulation/Gait Assistance Details: min guard to ensure safety. VC's to stay within RW. pt required standing rest break halfway through ambulation due to fatigue. Gait Pattern: Step-through pattern;Decreased stride length Gait velocity: decreased    Exercises     PT Diagnosis: Difficulty walking;Generalized weakness;Other (comment) (decreased endurance)  PT Problem List: Decreased strength;Decreased activity tolerance;Decreased mobility;Decreased knowledge of use of DME PT Treatment Interventions: DME instruction;Gait training;Functional mobility training;Therapeutic activities;Therapeutic exercise;Patient/family education     PT Goals(Current goals can be found in the care plan section) Acute Rehab PT Goals Patient Stated Goal: to get stronger PT Goal Formulation: With patient Time For Goal Achievement: 01/24/13 Potential to Achieve Goals: Good  Visit Information  Last PT Received On: 01/10/13 Assistance Needed: +1 History of Present Illness: Pt is a 72 y.o.M COPD OSA OHS CHF  Readmit after two days with acute on chronic resp failure.  Pt in pulm edema and severe CO2 elevation with delerium          Prior Functioning  Home Living Family/patient expects to be discharged to:: Private residence Living Arrangements: Alone Available Help at Discharge: Family;Available PRN/intermittently (sister can provide assist prn) Type of Home: Apartment Home Access: Stairs to enter Entrance  Stairs-Number of Steps: 2 Entrance Stairs-Rails: Left Home Layout:  One level Home Equipment: Walker - 2 wheels;Shower seat;Bedside commode Prior Function Level of Independence: Needs assistance Comments: pt able to ambulate with RW and sister assists with ADLs prn. Communication Communication: No difficulties    Cognition  Cognition Arousal/Alertness: Awake/alert Behavior During Therapy: WFL for tasks assessed/performed Overall Cognitive Status: Within Functional Limits for tasks assessed    Extremity/Trunk Assessment Lower Extremity Assessment Lower Extremity Assessment: Generalized weakness;RLE deficits/detail RLE Deficits / Details: pt reports occassional numbness/tingling in R foot   Balance    End of Session PT - End of Session Equipment Utilized During Treatment: Oxygen (4L) Activity Tolerance: Patient limited by fatigue Patient left: in chair;with family/visitor present;with call bell/phone within reach;Other (comment);with chair alarm set (MD in room) Nurse Communication: Mobility status  GP     Sol Blazing 01/10/2013, 2:41 PM

## 2013-01-11 DIAGNOSIS — J9620 Acute and chronic respiratory failure, unspecified whether with hypoxia or hypercapnia: Principal | ICD-10-CM

## 2013-01-11 LAB — BASIC METABOLIC PANEL
CO2: 38 mEq/L — ABNORMAL HIGH (ref 19–32)
Calcium: 8.7 mg/dL (ref 8.4–10.5)
Creatinine, Ser: 2.46 mg/dL — ABNORMAL HIGH (ref 0.50–1.35)
Glucose, Bld: 309 mg/dL — ABNORMAL HIGH (ref 70–99)
Sodium: 139 mEq/L (ref 135–145)

## 2013-01-11 LAB — GLUCOSE, CAPILLARY
Glucose-Capillary: 231 mg/dL — ABNORMAL HIGH (ref 70–99)
Glucose-Capillary: 223 mg/dL — ABNORMAL HIGH (ref 70–99)

## 2013-01-11 MED ORDER — PREDNISONE 10 MG PO TABS: 20.0000 mg | ORAL_TABLET | Freq: Every day | ORAL | Status: DC

## 2013-01-11 MED ORDER — INSULIN ASPART 100 UNIT/ML ~~LOC~~ SOLN: 4.0000 [IU] | Freq: Three times a day (TID) | SUBCUTANEOUS | Status: DC

## 2013-01-11 MED ORDER — IPRATROPIUM BROMIDE 0.02 % IN SOLN
RESPIRATORY_TRACT | Status: AC
  Filled 2013-01-11: qty 2.5

## 2013-01-11 MED ORDER — INSULIN NPH (HUMAN) (ISOPHANE) 100 UNIT/ML ~~LOC~~ SUSP
18.0000 [IU] | Freq: Two times a day (BID) | SUBCUTANEOUS | Status: DC
  Filled 2013-01-11: qty 10

## 2013-01-11 NOTE — Progress Notes (Signed)
Clinical Social Work  CSW met with patient at bedside in order to provide bed offers. Patient chose [facility] for SNF. CSW spoke with SNF who is agreeable to accept after 1530. CSW and patient called dtr who is agreeable to transport patient to SNF. Patient has portable oxygen to use in transport. CSW informed RN and prepared DC packet. RN to call report to SNF and to give patient DC packet when dtr arrives.   CSW is signing off but available if needed.  Unk Lightning, LCSW (Coverage for [employer])

## 2013-01-11 NOTE — Discharge Summary (Signed)
Physician Discharge Summary  [name] [name]:[id number] DOB: February 28, 1941 DOA: 01/06/2013  PCP: Georgianne Fick, MD  Admit date: 01/06/2013 Discharge date: 01/11/2013  Recommendations for Outpatient Follow-up:  1. Pt will need to follow up with PCP in 2 weeks post discharge 2. Please obtain BMP to evaluate electrolytes and kidney function in 1 week 3. Please also check CBC to evaluate Hg and Hct levels in 1 week   Discharge Diagnoses:  Principal Problem:   Respiratory failure with hypercapnia Active Problems:   DM type 2, uncontrolled, with renal complications   HYPERLIPIDEMIA   Morbid obesity   HYPERTENSION   OSA (obstructive sleep apnea)   Acute exacerbation of chronic obstructive pulmonary disease (COPD)   Cor pulmonale   Chronic respiratory failure   Acute encephalopathy Acute on Chronic Respiratory failure with hypercapnia  -multifactorial secondary to decompensated OHS/OSA, cor pulmonale, diastolic CHF, imposed on acute on chronic COPD -He'll continue on nighttime BiPAP with settings of 20/6  - pt is clinically improving but breath sounds are still diminished  - appreciate PCCM input  - will continue with diuresis as pt able to tolerate and based on kidney function, will also continue steroids  -wean prednisone 20 mg over the next week--he would take 20 mg for 3 days, then 10 mg for 3 days  - will continue BD's scheduled and as needed  -remain on 4L [place], which is his home requirement  Active Problems:  DM type 2, uncontrolled, with renal complications  -Patient temporarily required an insulin drip for metabolic acidosis and hyper glycemia - will discontinue Insulin drip 11.11.14  - place on SSI and continue long acting insulin  - increase NPH to home dose at the time of discharge with novolog 4 units before each meal -Hemoglobin A1c 7.8 on November 30,014 Acute on chronic renal failure  - remains overall at baseline and trending down this AM  - close monitoring while  diuresing and repeat BMP in AM  -Baseline creatinine 2.5-2.8  -Suspect a component of cardiorenal syndrome  -Discharge creatinine 2.46 Morbid obesity  - with BMP > 50, nutrition consultation  HYPERTENSION  - reasonable inpatient control  -continue zebeta Acute exacerbation of chronic obstructive pulmonary disease (COPD)  - management with BD's as noted above  -Continue by mouth prednisone and weaned within the next week per pulm recommendations  Cor pulmonale  - monitor respiratory status  Acute encephalopathy  - most likely secondary to hypercarbia--pCO2 was 111 at time of admission which improved with tx with BiPAP - now resolved, pt at his functional baseline mental status  Acute on Chronic Diastolic CHF  -Patient was initially treated with intravenous furosemide which was converted to oral furosemide home dose. - Continue lasix 80 mg po twice a day - strict I's and O's, daily weights  -neg 7.9 liters for the admission  -Daily weights  Thrombocytopenia  - remains stable and at baseline  - Patient's hemoglobin remained stable without evidence of active bleeding Deconditioning  -Physical therapy recommends a skilled nursing facility   Discharge Condition: stable  Disposition:  skilled nursing facility  Diet: 1800-calorie ADA Wt Readings from Last 3 Encounters:  01/11/13 118.752 kg (261 lb 12.8 oz)  01/04/13 120.1 kg (264 lb 12.4 oz)  12/27/12 119.75 kg (264 lb)    History of present illness:  72 y.o. male with COPD, OSA, and CHF who was brought to the ED due to worsening confusion that started 2-3 days prior to this admission. Pt has been hospitalized from  11/02 - 11/06 and per his sister his presentation appears to be similar this time. In ED, ABG revealed a CO2 level of 111, and pt was placed on BIPAP and TRH asked to admit to ICU.  PCCM was consulted and assisted with management.  The patient was started on intravenous furosemide and intravenous Solu-Medrol. He was  placed on BiPAP. The patient gradually improved. He exhibited good diuresis with intravenous furosemide. He was transitioned to his home dose of oral furosemide. The patient had hyperglycemia as a result of his steroids. His insulin dosing was adjusted accordingly. He will be discharged home on his usual home dose. I expect his glycemic control to continue to improve as his steroids are weaned. Discussions with the patient's daughter weren't fruitful. She agreed for a skilled nursing facility placement. The patient was weaned from his BiPAP. His BiPAP settings were adjusted by the pulmonary service. He will be discharged with nightly BiPAP with settings of 20/6. The importance of wearing his BiPAP at night we stressed to the patient. He will continue on his supplemental oxygen at 4 L nasal cannula. This can be titrated down to keep his oxygen saturation greater than 92%. He will continue using his albuterol, Atrovent, and budesonide nebulizers.     Consultants: PCCM  Discharge Exam: Filed Vitals:   01/11/13 1039  BP: 133/59  Pulse:   Temp:   Resp:    Filed Vitals:   01/11/13 0625 01/11/13 0755 01/11/13 0823 01/11/13 1039  BP: 120/51   133/59  Pulse: 72     Temp: 97.8 F (36.6 C)     TempSrc: Oral     Resp: 20     Height:      Weight: 118.752 kg (261 lb 12.8 oz)     SpO2: 98% 98% 95%    General: A&O x 3, NAD, pleasant, cooperative Cardiovascular: RRR, no rub, no gallop, no S3 Respiratory: Bilateral scattered rales, no wheeze, no rhonchi Abdomen:soft, nontender, nondistended, positive bowel sounds Extremities: trace LE edema, No lymphangitis, no petechiae  Discharge Instructions      Discharge Orders   Future Appointments Provider Department Dept Phone   01/30/2013 2:00 PM Julio Sicks, NP [facility] [phone]   Future Orders Complete By Expires   Diet - low sodium heart healthy  As directed    Increase activity slowly  As directed        Medication List     STOP taking these medications       amoxicillin-clavulanate 875-125 MG per tablet  Commonly known as:  AUGMENTIN      TAKE these medications       acetaminophen 500 MG tablet  Commonly known as:  TYLENOL  Take 500 mg by mouth every 6 (six) hours as needed for pain ("per bottle").     albuterol (2.5 MG/3ML) 0.083% nebulizer solution  Commonly known as:  PROVENTIL  Take 6 mLs (5 mg total) by nebulization 4 (four) times daily. Dx 496     bisoprolol 5 MG tablet  Commonly known as:  ZEBETA  TAKE 1 TABLET BY MOUTH TWICE DAILY     budesonide 0.25 MG/2ML nebulizer solution  Commonly known as:  PULMICORT  Take 2 mLs (0.25 mg total) by nebulization 4 (four) times daily.     calcitRIOL 0.25 MCG capsule  Commonly known as:  ROCALTROL  Take 1 capsule (0.25 mcg total) by mouth daily.     fluticasone 50 MCG/ACT nasal spray  Commonly known as:  FLONASE  Place 2 sprays into both nostrils daily.     furosemide 80 MG tablet  Commonly known as:  LASIX  Take 1 tablet (80 mg total) by mouth 2 (two) times daily.     HUMULIN N 100 UNIT/ML injection  Generic drug:  insulin NPH  Inject 20-22 Units into the skin 2 (two) times daily. Take 20 units in the morning and 22 units at night     insulin aspart 100 UNIT/ML injection  Commonly known as:  novoLOG  Inject 4 Units into the skin 3 (three) times daily with meals.     ipratropium 0.02 % nebulizer solution  Commonly known as:  ATROVENT  Take 2.5 mLs (0.5 mg total) by nebulization 4 (four) times daily. DX: 496     pantoprazole 40 MG tablet  Commonly known as:  PROTONIX  Take 30-60 min before first meal of the day     predniSONE 10 MG tablet  Commonly known as:  DELTASONE  Take 2 tablets (20 mg total) by mouth daily with breakfast. Take 20mg  (2tabs) x 3 days, then 10mg  (1 tab) x 3 days     simvastatin 40 MG tablet  Commonly known as:  ZOCOR  TAKE 1 TABLET BY MOUTH EVERY EVENING         The results of significant diagnostics  from this hospitalization (including imaging, microbiology, ancillary and laboratory) are listed below for reference.    Significant Diagnostic Studies: Nm Pulmonary Perf And Vent  01/01/2013   CLINICAL DATA:  Short of breath, elevated BUN and creatinine.  EXAM: NUCLEAR MEDICINE VENTILATION - PERFUSION LUNG SCAN  TECHNIQUE: Ventilation images were obtained in multiple projections using inhaled aerosol technetium 99 M DTPA. Perfusion images were obtained in multiple projections after intravenous injection of Tc-40m MAA.  COMPARISON:  None.  RADIOPHARMACEUTICALS:  40.3 mCi Tc-23m DTPA aerosol and 5.09 mCi Tc-84m MAA  FINDINGS: Ventilation: There is significant heterogeneity of ventilation without focal defect.  Perfusion: There is mild heterogeneity of perfusion which matches the heterogeneity of the ventilation. No wedge-shaped peripheral perfusion defects.  IMPRESSION: Very low probability for acute pulmonary embolism. Heterogeneous perfusion and ventilation consistent with COPD.   Electronically Signed   By: Genevive Bi M.D.   On: 01/01/2013 15:32   Dg Chest Port 1 View  01/10/2013   CLINICAL DATA:  Respiratory distress.  EXAM: PORTABLE CHEST - 1 VIEW  COMPARISON:  01/09/2013, 01/07/2013, and 12/31/2012  FINDINGS: There is new peribronchial thickening at the right lung base. No consolidative infiltrates or effusions. Heart size and vascularity are normal. No osseous abnormality.  IMPRESSION: New bronchitic changes at the right base.   Electronically Signed   By: Geanie Cooley M.D.   On: 01/10/2013 07:45   Dg Chest Port 1 View  01/09/2013   CLINICAL DATA:  Respiratory failure.  EXAM: PORTABLE CHEST - 1 VIEW  COMPARISON:  Single view of the chest 01/07/2013 and 12/31/2012.  FINDINGS: The lungs are clear. Heart size is normal. No pneumothorax or pleural fluid.  IMPRESSION: No acute disease.   Electronically Signed   By: Drusilla Kanner M.D.   On: 01/09/2013 07:49   Dg Chest Portable 1  View  01/07/2013   CLINICAL DATA:  Wheezing.  EXAM: PORTABLE CHEST - 1 VIEW  COMPARISON:  12/31/2012  FINDINGS: The heart size and mediastinal contours are within normal limits. Pulmonary vascular congestion noted. No airspace consolidation. The visualized skeletal structures are unremarkable.  IMPRESSION: Pulmonary vascular congestion.   Electronically Signed  By: Signa Kell M.D.   On: 01/07/2013 01:25   Dg Chest Portable 1 View  12/31/2012   CLINICAL DATA:  Shortness of breath.  EXAM: PORTABLE CHEST - 1 VIEW  COMPARISON:  06/21/2012.  FINDINGS: 1705 hr. Lordotic positioning. The heart size and mediastinal contours are normal. The lungs are clear. There is no pleural effusion or pneumothorax. No acute osseous findings are identified. Telemetry leads overlie the chest.  IMPRESSION: No active cardiopulmonary process.   Electronically Signed   By: Roxy Horseman M.D.   On: 12/31/2012 17:19     Microbiology: No results found for this or any previous visit (from the past 240 hour(s)).   Labs: Basic Metabolic Panel:  Recent Labs Lab 01/07/13 0623 01/08/13 0348 01/09/13 0320 01/10/13 0454 01/11/13 0521  NA 141 145 143 137 139  K 4.7 5.2* 4.4 3.9 4.6  CL 97 100 98 93* 97  CO2 38* 42* 39* 36* 38*  GLUCOSE 247* 260* 201* 298* 309*  BUN 65* 67* 71* 73* 67*  CREATININE 2.79* 2.47* 2.31* 2.58* 2.46*  CALCIUM 9.8 9.6 9.3 9.3 8.7   Liver Function Tests: No results found for this basename: AST, ALT, ALKPHOS, BILITOT, PROT, ALBUMIN,  in the last 168 hours No results found for this basename: LIPASE, AMYLASE,  in the last 168 hours No results found for this basename: AMMONIA,  in the last 168 hours CBC:  Recent Labs Lab 01/07/13 0035 01/07/13 0044 01/07/13 0623 01/08/13 0348 01/09/13 0320 01/10/13 0454  WBC 15.5*  --  18.8* 12.8* 13.1* 13.2*  NEUTROABS 11.9*  --   --   --   --   --   HGB 12.8* 15.3 12.3* 11.4* 12.0* 12.6*  HCT 44.3 45.0 42.8 38.2* 39.0 41.3  MCV 100.0  --  99.8 96.5  93.5 94.1  PLT 79*  --  59* 68* 84* 87*   Cardiac Enzymes: No results found for this basename: CKTOTAL, CKMB, CKMBINDEX, TROPONINI,  in the last 168 hours BNP: No components found with this basename: POCBNP,  CBG:  Recent Labs Lab 01/10/13 0737 01/10/13 1307 01/10/13 1637 01/10/13 2109 01/11/13 0730  GLUCAP 286* 315* 292* 221* 231*    Time coordinating discharge:  Greater than 30 minutes  Signed:  Eve Rey, DO [facility] Pager: [phone] 01/11/2013, 10:51 AM

## 2013-01-11 NOTE — Progress Notes (Signed)
Mr Kisiel became recently active with [facility] Care Management services. He was also active with [facility] services. Spoke with Mr Orsak at bedside earlier who states he plans on going to SNF at discharge. Spoke with inpatient RNCM as well to confirm discharge plan. Raiford Noble, MSN-Ed, RN,BSN- [facility] [phone]

## 2013-01-11 NOTE — Progress Notes (Addendum)
Clinical Social Work Department CLINICAL SOCIAL WORK PLACEMENT NOTE 01/11/2013  Patient:  [name] [name], [name] [name]  Account Number:  [id number] Admit date:  01/06/2013  Clinical Social Worker:  Unk Lightning, LCSW  Date/time:  01/11/2013 11:30 AM  Clinical Social Work is seeking post-discharge placement for this patient at the following level of care:   SKILLED NURSING   (*CSW will update this form in Epic as items are completed)   01/11/2013  Patient/family provided with [facility] Department of Clinical Social Work's list of facilities offering this level of care within the geographic area requested by the patient (or if unable, by the patient's family).  01/11/2013  Patient/family informed of their freedom to choose among providers that offer the needed level of care, that participate in Medicare, Medicaid or managed care program needed by the patient, have an available bed and are willing to accept the patient.  01/11/2013  Patient/family informed of [facility]' ownership interest in [facility], as well as of the fact that they are under no obligation to receive care at this facility.  PASARR submitted to EDS on existing # PASARR number received from EDS on   FL2 transmitted to all facilities in geographic area requested by pt/family on  01/11/2013 FL2 transmitted to all facilities within larger geographic area on   Patient informed that his/her managed care company has contracts with or will negotiate with  certain facilities, including the following:     Patient/family informed of bed offers received:  01/11/13 Patient chooses bed at [facility] Physician recommends and patient chooses bed at    Patient to be transferred to [facility] on  01/11/13 Patient to be transferred to facility by DTR  The following physician request were entered in Epic:   Additional Comments:

## 2013-01-11 NOTE — Progress Notes (Signed)
Clinical Social Work Department BRIEF PSYCHOSOCIAL ASSESSMENT 01/11/2013  Patient:  [name] [name], [name] [name]     Account Number:  [id number]     Admit date:  01/06/2013  Clinical Social Worker:  Dennison Bulla  Date/Time:  01/11/2013 11:30 AM  Referred by:  Physician  Date Referred:  01/11/2013 Referred for  SNF Placement   Other Referral:   Interview type:  Patient Other interview type:    PSYCHOSOCIAL DATA Living Status:  ALONE Admitted from facility:   Level of care:   Primary support name:  Kesha Primary support relationship to patient:  CHILD, ADULT Degree of support available:   Strong    CURRENT CONCERNS Current Concerns  Post-Acute Placement   Other Concerns:    SOCIAL WORK ASSESSMENT / PLAN CSW received referral to assist with SNF placement. CSW reviewed chart and met with patient at bedside. CSW introduced myself and explained role.    Patient alert and oriented and reports he has been to SNF in the past and would like to return. Patient has been to [facility] but is requesting [facility] search. CSW left SNF list and explained process along with insurance co payments. Patient has CSW contact information and will get dtr to call when she is available. Patient is aware of DC and need to transfer to SNF today.    CSW completed FL2 and will follow up with bed offers.   Assessment/plan status:  Psychosocial Support/Ongoing Assessment of Needs Other assessment/ plan:   Information/referral to community resources:   SNF information    PATIENT'S/FAMILY'S RESPONSE TO PLAN OF CARE: Patient alert and oriented and engaged in assessment. Patient thanked CSW for assistance and reports he and family are happy that he can go to SNF again. Patient agreeable to plans and to CSW follow up.       Unk Lightning, LCSW (Coverage for [employer])

## 2013-01-11 NOTE — Progress Notes (Signed)
Patient discharged to [facility], being transported via his daughter. Report called to charge nurse at [facility].  Patient's IV removed prior to discharge, site clean, dry, and intact.

## 2013-01-15 ENCOUNTER — Encounter: Payer: Self-pay | Admitting: Internal Medicine

## 2013-01-15 ENCOUNTER — Non-Acute Institutional Stay (SKILLED_NURSING_FACILITY): Payer: Medicare Other | Admitting: Internal Medicine

## 2013-01-15 DIAGNOSIS — I2781 Cor pulmonale (chronic): Secondary | ICD-10-CM

## 2013-01-15 DIAGNOSIS — G4733 Obstructive sleep apnea (adult) (pediatric): Secondary | ICD-10-CM

## 2013-01-15 DIAGNOSIS — N058 Unspecified nephritic syndrome with other morphologic changes: Secondary | ICD-10-CM

## 2013-01-15 DIAGNOSIS — J9692 Respiratory failure, unspecified with hypercapnia: Secondary | ICD-10-CM

## 2013-01-15 DIAGNOSIS — I279 Pulmonary heart disease, unspecified: Secondary | ICD-10-CM

## 2013-01-15 DIAGNOSIS — E1129 Type 2 diabetes mellitus with other diabetic kidney complication: Secondary | ICD-10-CM

## 2013-01-15 DIAGNOSIS — I10 Essential (primary) hypertension: Secondary | ICD-10-CM

## 2013-01-15 DIAGNOSIS — N184 Chronic kidney disease, stage 4 (severe): Secondary | ICD-10-CM

## 2013-01-15 DIAGNOSIS — J441 Chronic obstructive pulmonary disease with (acute) exacerbation: Secondary | ICD-10-CM

## 2013-01-15 DIAGNOSIS — I5032 Chronic diastolic (congestive) heart failure: Secondary | ICD-10-CM

## 2013-01-15 DIAGNOSIS — J9600 Acute respiratory failure, unspecified whether with hypoxia or hypercapnia: Secondary | ICD-10-CM

## 2013-01-15 NOTE — Progress Notes (Signed)
MRN: [id number] Name: CLEBURNE SAVINI  Sex: male Age: 72 y.o. DOB: 1940-04-24  PSC #: Sonny Dandy Facility/Room: 108 Level Of Care: SNF Provider: Merrilee Seashore D Emergency Contacts: Extended Emergency Contact Information Primary Emergency Contact: Reed Pandy, [place] [address] [place] Home Phone: [phone] Relation: Sister Secondary Emergency Contact: Simms,Fannie Address: [address]          [place], [place] [address] [place] Home Phone: [phone] Relation: Sister  Code Status:   Allergies: Review of patient's allergies indicates no known allergies.  Chief Complaint  Patient presents with  . nursing home admission    HPI: Patient is 72 y.o. male who is recuperating from acute on chronic respiratory failure with PT/OT.  Past Medical History  Diagnosis Date  . Renal insufficiency   . Diabetes mellitus   . GERD (gastroesophageal reflux disease)   . Gastroparesis   . Esophageal stricture   . Hyperlipemia   . Hypertension   . Pancreatitis   . Chronic respiratory disease   . Depression   . Seizure disorder   . Arthritis   . Diverticulosis   . Sleep apnea     CPAP Machine  . Obesity   . ED (erectile dysfunction)   . Sleep apnea   . COPD (chronic obstructive pulmonary disease)     Gold Stage  III/IV  . H/O polycythemia   . Chronic kidney disease (CKD), stage III (moderate)   . DDD (degenerative disc disease), lumbar   . History of esophageal stricture   . Erectile dysfunction   . Peptic ulcer disease   . History of colon polyps   . Gout   . Gastroparesis   . Acute encephalopathy 06/20/2012  . 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 CHF (congestive heart failure) 05/19/2012  . DIVERTICULOSIS, COLON 08/05/2009    Qualifier: Diagnosis of  By: Koleen Distance CMA (AAMA), Hulan Saas    . Obesity hypoventilation syndrome 04/28/2011  . OSA (obstructive sleep apnea)  09/22/2006    Auto 2013:  Optimal pressure 13/10   . SEIZURE DISORDER 09/22/2006    Qualifier: Diagnosis of  By: Nena Jordan   . THROMBOCYTOPENIA 04/17/2007    Chronic, Qualifier: Diagnosis of  By: Thereasa Solo      Past Surgical History  Procedure Laterality Date  . Prostate surgery    . Coronary artery bypass graft      x 4  . Bone marrow biopsy    . Cardiac catheterization  05/02/2008  . Back surgery      lumbar      Medication List       This list is accurate as of: 01/15/13  9:53 PM.  Always use your most recent med list.               acetaminophen 500 MG tablet  Commonly known as:  TYLENOL  Take 500 mg by mouth every 6 (six) hours as needed for pain ("per bottle").     albuterol (2.5 MG/3ML) 0.083% nebulizer solution  Commonly known as:  PROVENTIL  Take 6 mLs (5 mg total) by nebulization 4 (four) times daily. Dx 496     bisoprolol 5 MG tablet  Commonly known as:  ZEBETA  TAKE 1 TABLET BY MOUTH TWICE DAILY     budesonide 0.25 MG/2ML nebulizer solution  Commonly known as:  PULMICORT  Take 2 mLs (0.25 mg total) by nebulization 4 (four) times daily.     calcitRIOL 0.25 MCG capsule  Commonly known as:  ROCALTROL  Take 1 capsule (0.25 mcg total) by mouth daily.     fluticasone 50 MCG/ACT nasal spray  Commonly known as:  FLONASE  Place 2 sprays into both nostrils daily.     furosemide 80 MG tablet  Commonly known as:  LASIX  Take 1 tablet (80 mg total) by mouth 2 (two) times daily.     HUMULIN N 100 UNIT/ML injection  Generic drug:  insulin NPH  Inject 20-22 Units into the skin 2 (two) times daily. Take 20 units in the morning and 22 units at night     insulin aspart 100 UNIT/ML injection  Commonly known as:  novoLOG  Inject 4 Units into the skin 3 (three) times daily with meals.     ipratropium 0.02 % nebulizer solution  Commonly known as:  ATROVENT  Take 2.5 mLs (0.5 mg total) by nebulization 4 (four) times daily. DX: 496      pantoprazole 40 MG tablet  Commonly known as:  PROTONIX  Take 30-60 min before first meal of the day     predniSONE 10 MG tablet  Commonly known as:  DELTASONE  Take 2 tablets (20 mg total) by mouth daily with breakfast. Take 20mg  (2tabs) x 3 days, then 10mg  (1 tab) x 3 days     simvastatin 40 MG tablet  Commonly known as:  ZOCOR  TAKE 1 TABLET BY MOUTH EVERY EVENING        No orders of the defined types were placed in this encounter.    Immunization History  Administered Date(s) Administered  . DTP 02/28/2004  . H1N1 01/03/2009, 01/02/2010  . Influenza Split 03/09/2011, 10/30/2012  . Influenza Whole 11/04/2010  . PPD Test 06/27/2012  . Pneumococcal Conjugate 03/01/2003    History  Substance Use Topics  . Smoking status: Former Smoker -- 1.00 packs/day for 60 years    Types: Cigarettes    Quit date: 05/12/2012  . Smokeless tobacco: Never Used  . Alcohol Use: No     Comment: former heavy drinker    Family history is noncontributory    Review of Systems  DATA OBTAINED: from patient; NO C/O EXCEPT ADMITS HASN'T SLEPT WELL GENERAL: Feels well no fevers, fatigue, appetite changes SKIN: No itching, rash or wounds EYES: No eye pain, redness, discharge EARS: No earache, tinnitus, change in hearing NOSE: No congestion, drainage or bleeding  MOUTH/THROAT: No mouth or tooth pain, No sore throat, No difficulty chewing or swallowing  RESPIRATORY: No cough, wheezing, SOB CARDIAC: No chest pain, palpitations, lower extremity edema  GI: No abdominal pain, No N/V/D or constipation, No heartburn or reflux  GU: No dysuria, frequency or urgency, or incontinence  MUSCULOSKELETAL: No unrelieved bone/joint pain NEUROLOGIC: No headache, dizziness or focal weakness PSYCHIATRIC: No overt anxiety or sadness. Sleeps well. No behavior issue.   Filed Vitals:   01/15/13 1620  BP: 129/64  Pulse: 74  Temp: 97.6 F (36.4 C)  Resp: 22    Physical Exam  GENERAL APPEARANCE: Alert,  conversant. Appropriately groomed. No acute distress.  SKIN: No diaphoresis rash, or wounds HEAD: Normocephalic, atraumatic  EYES: Conjunctiva/lids clear. Pupils round, reactive. EOMs intact.  EARS: External exam WNL, canals clear. Hearing grossly normal.  NOSE: No deformity or discharge.  MOUTH/THROAT: Lips w/o lesions  RESPIRATORY: Breathing is even, unlabored. Lung sounds are clear   CARDIOVASCULAR: Heart  RRR no murmurs, rubs or gallops. 1/2+ peripheral edema.  GASTROINTESTINAL: Abdomen is soft, non-tender, not distended w/ normal bowel sounds. GENITOURINARY: Bladder non tender, not distended  MUSCULOSKELETAL: No abnormal joints or musculature NEUROLOGIC: Oriented X3. Cranial nerves 2-12 grossly intact. Moves all extremities no tremor. PSYCHIATRIC: Mood and affect appropriate to situation, no behavioral issues  Patient Active Problem List   Diagnosis Date Noted  . Chronic diastolic heart failure 01/15/2013  . Acute-on-chronic respiratory failure 01/11/2013  . Acute encephalopathy 01/07/2013  . Respiratory failure with hypercapnia 12/31/2012  . COPD exacerbation 12/31/2012  . Chronic respiratory failure 12/27/2012  . Anemia, chronic 05/19/2012  . CKD (chronic kidney disease) stage 4, GFR 15-29 ml/min 05/27/2011  . Acute exacerbation of chronic obstructive pulmonary disease (COPD) 04/28/2011  . Cor pulmonale 04/28/2011  . COLONIC POLYPS, ADENOMATOUS, HX OF 08/05/2009  . Morbid obesity 10/13/2007  . THROMBOCYTOPENIA 04/17/2007  . DEPRESSION 04/17/2007  . DM type 2, uncontrolled, with renal complications 09/22/2006  . HYPERLIPIDEMIA 09/22/2006  . TOBACCO USER 09/22/2006  . HYPERTENSION 09/22/2006  . ALLERGIC RHINITIS 09/22/2006  . COPD GOLD III with reversibility 09/22/2006  . OSA (obstructive sleep apnea) 09/22/2006    CBC    Component Value Date/Time   WBC 13.2* 01/10/2013 0454   WBC 9.2 06/09/2009 1050   RBC 4.39 01/10/2013 0454   RBC 6.19* 06/09/2009 1050   HGB 12.6*  01/10/2013 0454   HGB 19.6* 06/09/2009 1050   HCT 41.3 01/10/2013 0454   HCT 60.8* 06/09/2009 1050   PLT 87* 01/10/2013 0454   PLT 57* 06/09/2009 1050   MCV 94.1 01/10/2013 0454   MCV 98.1* 06/09/2009 1050   LYMPHSABS 2.2 01/07/2013 0035   LYMPHSABS 2.9 06/09/2009 1050   MONOABS 1.2* 01/07/2013 0035   MONOABS 0.8 06/09/2009 1050   EOSABS 0.2 01/07/2013 0035   EOSABS 0.1 06/09/2009 1050   BASOSABS 0.0 01/07/2013 0035   BASOSABS 0.0 06/09/2009 1050    CMP     Component Value Date/Time   NA 139 01/11/2013 0521   K 4.6 01/11/2013 0521   CL 97 01/11/2013 0521   CO2 38* 01/11/2013 0521   GLUCOSE 309* 01/11/2013 0521   GLUCOSE 116* 02/16/2006 1143   BUN 67* 01/11/2013 0521   CREATININE 2.46* 01/11/2013 0521   CREATININE 1.88* 05/25/2012 1543   CALCIUM 8.7 01/11/2013 0521   PROT 8.3 12/31/2012 1740   ALBUMIN 3.9 12/31/2012 1740   AST 17 12/31/2012 1740   ALT 15 12/31/2012 1740   ALKPHOS 69 12/31/2012 1740   BILITOT 0.4 12/31/2012 1740   GFRNONAA 24* 01/11/2013 0521   GFRAA 28* 01/11/2013 0521    Assessment and Plan  Respiratory failure with hypercapnia Acute on chronic resolved; chronic remains-chronic O2 5L at rest, 6L with ambulation  Acute exacerbation of chronic obstructive pulmonary disease (COPD) Improved with steroids and nebs in hospital;all to be continued at SNF  OSA (obstructive sleep apnea) Pt says bipap doesn't help pt sleep well but does blow off his CO2; ordered prn restoril 15 mg for pt if he decides he wants help with sleep  Chronic diastolic heart failure Continue lasix and B Blocker  Cor pulmonale See CHF; pt is apparently using O2 more at this time which will slow the progression  CKD (chronic kidney disease) stage 4, GFR 15-29 ml/min Pt's BUN/Cr in hosp around 45/2.8- considered stable; CrCl  Estimated 13ml/min  HYPERTENSION Controlled on current regimen  DM type 2, uncontrolled, with renal complications Not too bad HbA1c 7.8; need  to find out how steroid  dependent pt is    Margit Hanks, MD

## 2013-01-15 NOTE — Assessment & Plan Note (Signed)
Controlled on current regimen.   

## 2013-01-15 NOTE — Assessment & Plan Note (Signed)
Continue lasix and B Blocker

## 2013-01-15 NOTE — Assessment & Plan Note (Addendum)
Pt's BUN/Cr in hosp around 45/2.8- considered stable; CrCl  Estimated 42ml/min

## 2013-01-15 NOTE — Assessment & Plan Note (Addendum)
See CHF; pt is apparently using O2 more at this time which will slow the progression

## 2013-01-15 NOTE — Assessment & Plan Note (Signed)
Not too bad HbA1c 7.8; need to find out how steroid dependent pt is

## 2013-01-15 NOTE — Assessment & Plan Note (Signed)
Pt says bipap doesn't help pt sleep well but does blow off his CO2; ordered prn restoril 15 mg for pt if he decides he wants help with sleep

## 2013-01-15 NOTE — Assessment & Plan Note (Signed)
Improved with steroids and nebs in hospital;all to be continued at [facility]

## 2013-01-15 NOTE — Assessment & Plan Note (Signed)
Acute on chronic resolved; chronic remains-chronic O2 5L at rest, 6L with ambulation

## 2013-01-16 ENCOUNTER — Telehealth: Payer: Self-pay | Admitting: Family Medicine

## 2013-01-16 ENCOUNTER — Other Ambulatory Visit: Payer: Self-pay | Admitting: *Deleted

## 2013-01-16 MED ORDER — TEMAZEPAM 15 MG PO CAPS: 15.0000 mg | ORAL_CAPSULE | Freq: Every evening | ORAL | Status: DC | PRN

## 2013-01-16 NOTE — Telephone Encounter (Signed)
Called pt spoke with sister, she states he is in rehab, ask her to have him call us.  Pt missed his last 4 month followup and needs to reschedule and he is on our high risk list that needs high dose flu shot.

## 2013-01-18 ENCOUNTER — Other Ambulatory Visit: Payer: Self-pay | Admitting: Family Medicine

## 2013-01-30 ENCOUNTER — Encounter: Payer: Medicare Other | Admitting: Adult Health

## 2013-01-30 ENCOUNTER — Non-Acute Institutional Stay (SKILLED_NURSING_FACILITY): Payer: Medicare Other | Admitting: Nurse Practitioner

## 2013-01-30 ENCOUNTER — Telehealth: Payer: Self-pay | Admitting: Internal Medicine

## 2013-01-30 DIAGNOSIS — I10 Essential (primary) hypertension: Secondary | ICD-10-CM

## 2013-01-30 DIAGNOSIS — I5032 Chronic diastolic (congestive) heart failure: Secondary | ICD-10-CM

## 2013-01-30 DIAGNOSIS — J449 Chronic obstructive pulmonary disease, unspecified: Secondary | ICD-10-CM

## 2013-01-30 DIAGNOSIS — G4733 Obstructive sleep apnea (adult) (pediatric): Secondary | ICD-10-CM

## 2013-01-30 DIAGNOSIS — E785 Hyperlipidemia, unspecified: Secondary | ICD-10-CM

## 2013-01-30 DIAGNOSIS — E1129 Type 2 diabetes mellitus with other diabetic kidney complication: Secondary | ICD-10-CM

## 2013-01-30 DIAGNOSIS — N058 Unspecified nephritic syndrome with other morphologic changes: Secondary | ICD-10-CM

## 2013-01-30 DIAGNOSIS — J9620 Acute and chronic respiratory failure, unspecified whether with hypoxia or hypercapnia: Secondary | ICD-10-CM

## 2013-01-30 DIAGNOSIS — N184 Chronic kidney disease, stage 4 (severe): Secondary | ICD-10-CM

## 2013-01-30 NOTE — Telephone Encounter (Signed)
Spoke with pt's sister She states that all the pt needed was appt for f/u here and this was already made Nothing further needed

## 2013-01-30 NOTE — Progress Notes (Signed)
Patient ID: [name] [name], male   DOB: 1940-11-19, 72 y.o.   MRN: [id number]    Nursing Home Location:  [facility]   Place of Service: SNF (31)  PCP: Georgianne Fick, MD  No Known Allergies  Chief Complaint  Patient presents with  . Discharge Note    HPI:  72 y.o. male with COPD, OSA, and CHF was brought to the ED due to worsening confusion that started 2-3 days prior to admission. Pt reports he does not remember why he had to go to the hospital; ABG revealed a CO2 level of 111, and pt was placed on BIPAP; pt was diuresed and placed on steroids once stable he was transferred to [facility] for additional rehab. Pt now requesting to go home with [facility] therapies; Patient currently doing well with therapy, and stable to discharge home with home health but will need close outpt followup.  Review of Systems:  Review of Systems  Constitutional: Negative for fever, chills and malaise/fatigue.  Respiratory: Positive for shortness of breath (with activites). Negative for cough.   Cardiovascular: Positive for leg swelling (to ankles). Negative for chest pain.  Gastrointestinal: Negative for abdominal pain and constipation.  Genitourinary: Negative for frequency.  Musculoskeletal: Negative for falls, joint pain and myalgias.  Skin: Negative.   Neurological: Negative for dizziness, loss of consciousness and headaches.  Endo/Heme/Allergies:       Pt remains with high glucose readings  Psychiatric/Behavioral: Negative for depression.     Past Medical History  Diagnosis Date  . Renal insufficiency   . Diabetes mellitus   . GERD (gastroesophageal reflux disease)   . Gastroparesis   . Esophageal stricture   . Hyperlipemia   . Hypertension   . Pancreatitis   . Chronic respiratory disease   . Depression   . Seizure disorder   . Arthritis   . Diverticulosis   . Sleep apnea     CPAP Machine  . Obesity   . ED (erectile dysfunction)   . Sleep apnea   . COPD (chronic  obstructive pulmonary disease)     Gold Stage  III/IV  . H/O polycythemia   . Chronic kidney disease (CKD), stage III (moderate)   . DDD (degenerative disc disease), lumbar   . History of esophageal stricture   . Erectile dysfunction   . Peptic ulcer disease   . History of colon polyps   . Gout   . Gastroparesis   . Acute encephalopathy 06/20/2012  . 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 CHF (congestive heart failure) 05/19/2012  . DIVERTICULOSIS, COLON 08/05/2009    Qualifier: Diagnosis of  By: Koleen Distance CMA (AAMA), Hulan Saas    . Obesity hypoventilation syndrome 04/28/2011  . OSA (obstructive sleep apnea) 09/22/2006    Auto 2013:  Optimal pressure 13/10   . SEIZURE DISORDER 09/22/2006    Qualifier: Diagnosis of  By: Nena Jordan   . THROMBOCYTOPENIA 04/17/2007    Chronic, Qualifier: Diagnosis of  By: Thereasa Solo     Past Surgical History  Procedure Laterality Date  . Prostate surgery    . Coronary artery bypass graft      x 4  . Bone marrow biopsy    . Cardiac catheterization  05/02/2008  . Back surgery      lumbar   Social History:   reports that he quit smoking about 8 months ago. His smoking use included Cigarettes.  He has a 60 pack-year smoking history. He has never used smokeless tobacco. He reports that he does not drink alcohol or use illicit drugs.  Family History  Problem Relation Age of Onset  . Colon cancer Neg Hx   . Cancer Brother     unsure what kind  . Heart disease Mother   . Heart disease Father   . Emphysema Father     Medications: Patient's Medications  New Prescriptions   No medications on file  Previous Medications   ACETAMINOPHEN (TYLENOL) 500 MG TABLET    Take 500 mg by mouth every 6 (six) hours as needed for pain ("per bottle").   ALBUTEROL (PROVENTIL) (2.5 MG/3ML) 0.083% NEBULIZER SOLUTION    Take 6 mLs (5 mg total) by nebulization 4 (four) times daily. Dx 496    BISOPROLOL (ZEBETA) 5 MG TABLET    TAKE 1 TABLET BY MOUTH TWICE DAILY   BUDESONIDE (PULMICORT) 0.25 MG/2ML NEBULIZER SOLUTION    Take 2 mLs (0.25 mg total) by nebulization 4 (four) times daily.   CALCITRIOL (ROCALTROL) 0.25 MCG CAPSULE    Take 1 capsule (0.25 mcg total) by mouth daily.   FLUTICASONE (FLONASE) 50 MCG/ACT NASAL SPRAY    Place 2 sprays into both nostrils daily.   FUROSEMIDE (LASIX) 80 MG TABLET    Take 1 tablet (80 mg total) by mouth 2 (two) times daily.   INSULIN ASPART (NOVOLOG) 100 UNIT/ML INJECTION    Inject 4 Units into the skin 3 (three) times daily with meals.   INSULIN NPH (HUMULIN N) 100 UNIT/ML INJECTION    Inject 20-22 Units into the skin 2 (two) times daily. Take 20 units in the morning and 22 units at night   IPRATROPIUM (ATROVENT) 0.02 % NEBULIZER SOLUTION    Take 2.5 mLs (0.5 mg total) by nebulization 4 (four) times daily. DX: 496   PANTOPRAZOLE (PROTONIX) 40 MG TABLET    Take 30-60 min before first meal of the day   SIMVASTATIN (ZOCOR) 40 MG TABLET    TAKE 1 TABLET BY MOUTH EVERY EVENING   TEMAZEPAM (RESTORIL) 15 MG CAPSULE    Take 1 capsule (15 mg total) by mouth at bedtime as needed for sleep.  Modified Medications   No medications on file  Discontinued Medications   PREDNISONE (DELTASONE) 10 MG TABLET    Take 2 tablets (20 mg total) by mouth daily with breakfast. Take 20mg  (2tabs) x 3 days, then 10mg  (1 tab) x 3 days     Physical Exam:  Filed Vitals:   01/30/13 1417  BP: 131/68  Pulse: 82  Temp: 97.3 F (36.3 C)  Resp: 20  SpO2: 97%   Physical Exam  Constitutional: He is oriented to person, place, and time and well-developed, well-nourished, and in no distress. No distress.  HENT:  Head: Normocephalic and atraumatic.  Mouth/Throat: Oropharynx is clear and moist. No oropharyngeal exudate.  Eyes: Conjunctivae and EOM are normal. Pupils are equal, round, and reactive to light.  Neck: Normal range of motion. Neck supple. No thyromegaly present.    Cardiovascular: Normal rate, regular rhythm and normal heart sounds.   Pulmonary/Chest: Effort normal. No respiratory distress.  diminished throughout  Abdominal: Soft. Bowel sounds are normal.  Obese  Musculoskeletal: He exhibits edema (+1 at ankles).  Lymphadenopathy:    He has no cervical adenopathy.  Neurological: He is alert and oriented to person, place, and time.  Skin: Skin is warm and dry. He is not diaphoretic.  Psychiatric: Affect normal.  Labs reviewed: Basic Metabolic Panel:  Recent Labs  78/29/56 0152  01/09/13 0320 01/10/13 0454 01/11/13 0521  NA 146*  < > 143 137 139  K 3.4*  < > 4.4 3.9 4.6  CL 101  < > 98 93* 97  CO2 43*  < > 39* 36* 38*  GLUCOSE 128*  < > 201* 298* 309*  BUN 30*  < > 71* 73* 67*  CREATININE 1.71*  < > 2.31* 2.58* 2.46*  CALCIUM 8.5  < > 9.3 9.3 8.7  MG 1.5  --   --   --   --   PHOS 2.2*  --   --   --   --   < > = values in this interval not displayed. Liver Function Tests:  Recent Labs  06/20/12 0152 06/22/12 0500 12/31/12 1740  AST 11 14 17   ALT 6 8 15   ALKPHOS 54 59 69  BILITOT 0.5 0.4 0.4  PROT 5.7* 6.8 8.3  ALBUMIN 2.7* 3.2* 3.9   No results found for this basename: LIPASE, AMYLASE,  in the last 8760 hours No results found for this basename: AMMONIA,  in the last 8760 hours CBC:  Recent Labs  05/25/12 1543 06/19/12 2205  01/07/13 0035  01/08/13 0348 01/09/13 0320 01/10/13 0454  WBC 6.8 14.4*  < > 15.5*  < > 12.8* 13.1* 13.2*  NEUTROABS 3.2 9.3*  --  11.9*  --   --   --   --   HGB 11.3* 13.2  < > 12.8*  < > 11.4* 12.0* 12.6*  HCT 36.3* 44.0  < > 44.3  < > 38.2* 39.0 41.3  MCV 89.2 97.1  < > 100.0  < > 96.5 93.5 94.1  PLT 26* 65*  < > 79*  < > 68* 84* 87*  < > = values in this interval not displayed. Cardiac Enzymes:  Recent Labs  05/12/12 1300 06/19/12 2302  TROPONINI <0.30 <0.30   BNP: No components found with this basename: POCBNP,  CBG:  Recent Labs  01/11/13 1151 01/11/13 1300  01/11/13 1650  GLUCAP 223* 183* 316*   TSH:  Recent Labs  05/14/12 0845  TSH 1.464   A1C: Lab Results  Component Value Date   HGBA1C 7.8* 01/01/2013     Assessment/Plan 1. Chronic diastolic heart failure -stable; conts 80 mg lasix BID  2. HYPERTENSION -stable at this time  3. OSA (obstructive sleep apnea) - has new bipap settings; to cont this at home  4. COPD GOLD III with reversibility -on long term O2, remains on 4 L, to cont Nebs and O2  5. DM type 2, uncontrolled, with renal complications -blood sugars remain elevated, encouraged diet modification; will need further adjustments and follow up by PCP  6. CKD (chronic kidney disease) stage 4, GFR 15-29 ml/min -follow up labs were not obtained at [facility]; will order these now and have results faxed to pts PCP and pulmonary   7. HYPERLIPIDEMIA -to cont zocor  8. Acute on chronic Respiratory failure  -improved; will dc home on nocturnal bipap, nebs, lasix, will need follow up with pulmonary and will get SW at [facility] to set up appt.  pt is stable for discharge-will need PT/OT/Nursing/SW per home health. DME needed includes 3:1, WC, front wheeled walker. Rx written.  will need to follow up with PCP within 1 week and also follow up with pulmonary office.

## 2013-02-06 ENCOUNTER — Encounter: Payer: Self-pay | Admitting: Adult Health

## 2013-02-06 ENCOUNTER — Ambulatory Visit (INDEPENDENT_AMBULATORY_CARE_PROVIDER_SITE_OTHER): Payer: Medicare Other | Admitting: Adult Health

## 2013-02-06 VITALS — BP 136/58 | HR 69 | Temp 98.0°F | Ht 68.0 in | Wt 275.0 lb

## 2013-02-06 DIAGNOSIS — J449 Chronic obstructive pulmonary disease, unspecified: Secondary | ICD-10-CM

## 2013-02-06 NOTE — Progress Notes (Signed)
Subjective:    Patient ID: [name] [name], male    DOB: 1941-01-11, 72 y.o.   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  He is wearing BiPAP compliantly most recently, he denies any issues with his mask fit or pressure.  He has had one episode recently of where he was in an awakened/dream state, and he is unclear if he was awake or not during this time.  This has happened only 3 times in the last 2 years, with the last being one year ago.  It has not recurred since that time.  Overall, he feels that he is sleeping well with his device, and that it does help his daytime alertness.   Review of Systems  Constitutional: Negative for fever and unexpected weight change.  HENT: Negative for ear pain, nosebleeds, congestion, sore throat, rhinorrhea, sneezing, trouble swallowing, dental problem, postnasal drip and sinus pressure.   Eyes: Negative for redness and itching.  Respiratory: Negative for cough, chest tightness, shortness of breath and wheezing.   Cardiovascular: Negative for palpitations and leg swelling.  Gastrointestinal: Negative for nausea and vomiting.  Genitourinary: Negative for dysuria.  Musculoskeletal: Negative for joint swelling.  Skin: Negative for rash.  Neurological: Negative for headaches.  Hematological: Does not bruise/bleed easily.  Psychiatric/Behavioral: Positive for hallucinations and confusion. Negative for dysphoric mood. The patient is not nervous/anxious.        Objective:   Physical Exam Obese male in no acute distress Nose without purulence or discharge noted No skin breakdown or pressure necrosis with CPAP mask Neck large, no obvious lymphadenopathy or thyromegaly Lower extremities with edema noted, no cyanosis Alert and oriented, does not appear to be sleepy, moves all 4 extremities.       Assessment & Plan:  * Subjective:    Patient ID: [name] [name], male    DOB: [birth year]    MRN: [id number]  HPI 5 yobm with morbid obesity actively smoking followed in pulmonary clinic for copd/ chronic resp failure - PFTs 10/13/07 FEV1 of 45%, ratio 63%, FVC 15% improvement after bronchodilators DLCO 45%    05/04/2011 Post Hospital follow up  Admitted 04/16/2011 and discharged 04/28/2011 for acute on chronic hypercarbic/hypoxic respiratory failure, PNA -RLL, volume overload/pulmonary edema w/ decompensated on cor pulmonale ,AECOPD vs acei effect w/ ongoing smoking. Tx with IV abx, steroids, diuresis, BIPAP. Discharged on Levaquin and steroid taper.   Cc  breathing has improved since discharge but still has some shortness of breath with exertion along with weakness and fatigue. Minimum cough. Clear mucus production with occasional dark red tinged sputum. BLE edema, greater on right than left. Uses BiPAP at night and states that he's sleeping much better now than he has in years. Has completed discharge antibiotics. Uses nebulizer's three-  four times daily and is completing current steroid taper.   On 5L continuous O2 and reports smoking 4-6 cigarettes since discharge. Hx  5 ppd smoking at his max.   Admits to smoking inside home and with removal of oxygen in a separate room. Will occasionally leave oxygen system on.  CXR >  improved aeration with some residual opacity in RLL  rec Finish Prednisone taper as directed Low salt diet.  Continue Xopenex and Atrovent Neb Four times a day   Very important to stop smoking.  No smoking in house - with oxygen -it is very dangerous-flammable.  Legs elevated.  Follow up Dr. Sherene Sires  In 3 weeks Dr. Sherene Sires   follow up Dr. Shelle Iron as planned for sleep consult.   05/27/2011 f/u ov/Wert cc "cpap working better" (record says it's bipap and he has new machine) wearing 02  at hs and practically all day on 02 at 5lpm breathing better than he has in a year now off acei and actually ran out of albuterol > 1 week prior to OV  s effect.   Thoroughly confused with names of meds/ machines/ 02 rx.  No purulent sputum. Leg swelling better >>labs-improved renal fxn off ace  and xray -w / residual opacity at right base.   09/10/2011 Follow up and Med review  Returns for a three-month followup and medication review. We reviewed all his medications and organized them into a medication calendar. Says that he is at his baseline. Has daily cough and congestion mainly clear to white mucus. Continues to smoke with no plans on quitting. Smoking cessation discussed. Is having difficulty affording his ipratropium nebulizer through the pharmacy. We discussed getting his nebulizer through the DME company rec Add Budesonide Neb 0.25mg  Twice daily   Continue on Ipratropium Neb Four times a day  (sent rx to DME company -[employer] )  May get saline nasal spray for nasal stuffiness May get mucinex DM Twice daily  As needed  Cough/congestion  Follow med calendar closely and bring to each visit.  follow up Dr. Sherene Sires  In  3 months and As needed   I will call with xray results. > Chronic lung changes consistent with COPD.  Minimal atelectasis or scarring at bases.   12/16/2011 f/u ov/Wert cc missed appt  01/25/2012 f/u ov/Wert cc missed appt  05/30/12 [name] for osa/ohs rec Will get your equipment company to get you new mask, supplies, and check your bipap machine.  Your machine may need to be replaced. Work on [employer] loss  06/13/2012 f/u ov/Wert f/u   Copd/ chronic resp failure Chief Complaint  Patient presents with  . Follow-up    pt c/o increased SOB, productive cough with tan phlegm since last OV.   wearing 3lpm not using bipap since last ov with [facility] and not saturating well  on 3lpm pulsed on arrival, overal worse since last ov, thoroughly confused with meds, nebs.    No obvious daytime variabilty or assoc  or cp or chest tightness, subjective wheeze overt sinus or hb symptoms. No unusual exp hx or h/o childhood pna/ asthma or premature  birth to his knowledge.  >>O2 increased to 3l/m at rest and 5 l/m walking    06/29/2012 Post Hospital follow up  Admitted 4/21-4/25 for acute encephalopathy due to hypercarbic resp failure from BIPAP and med non compliance.  Required brief intubation/vent support .  Tx w/ empiric abx, steroid for COPD flare .  Discharged on Levaquin every other day .  Finishes  Levaquin in 3 days .  Discharged to [facility]. Not wearing BIPAP since discharge.  We discussed in detail with pt and family member importance of BIPAP. He says he did not know that wearing BIPAP was that important.  Says he doing better but still weak.  rec Make sure you wear you BIPAP with naps and At bedtime   Continue with O2 at 3lm continuous flow .  Follow up with [facility] in 4 weeks  Follow up  With Dr. Sherene Sires  In 6 weeks  Continue on Budesonide Neb Twice daily   Continue on [name] .Four times a day   08/08/2012  f/u ov/Wert re chronic resp failure / sleeping on bipap Chief Complaint  Patient presents with  . Follow-up    Pt states that his breathing has improved since the last visit. No new co's today.   still doing poorly with noct breathing despite bibap, swelling is better Mucus is clear. rec F/u for medication reconciliation> did not do   12/27/2012 f/u ov/Wert quit smoking 2013  re: chronic resp failure Chief Complaint  Patient presents with  . Acute Visit    Pt c/o increased SOB for the past 2-3 wks. He states that this started when he was exposed to smoke coming from the stove. He gets SOB with any exertion at all. Sats were 70% 5lpm pulsed and increased to 91%5lpm pulsed with pursed lip breathing. Pt admits that he holds his breath alot or forgets to breathe.    Wears bipap and does fine during the night then feels llightheaded p stop bipap in am and place 02 on 3lpm but in terms of breathing  feels ok sitting still,  Doe x across the room uses w/c to go out and basically just to see the doctor - no better  with saba but really confused with meds/ names/ how to use >no changes    02/06/2013     Follow up and Med review .  Returns for follow up and med review est med calendar - pt brought all meds with him today.  reports DOE is at baseline.  CAT = 23.  We reviewed all his medications and organized them into a medication calendar. Pt gets confused easily with meds , several changes recently. Pt education given on meds.  Patient is wearing BiPAP every night . Patient denies any hemoptysis, orthopnea, PND, or increased leg swelling.   Current Medications, Allergies, Past Medical History, Past Surgical History, Family History, and Social History were reviewed in [employer] record.  ROS  The following are not active complaints unless bolded sore throat, dysphagia, dental problems, itching, sneezing,  nasal congestion or excess/ purulent secretions, ear ache,   fever, chills, sweats, unintended wt loss, pleuritic or exertional cp, hemoptysis,  orthopnea pnd or leg swelling, presyncope, palpitations, heartburn, abdominal pain, anorexia, nausea, vomiting, diarrhea  or change in bowel or urinary habits, change in stools or urine, dysuria,hematuria,  rash, arthralgias, visual complaints, headache, numbness weakness or ataxia or problems with walking or coordination,  change in mood/affect or memory.               Objective:     GEN: obese,   elderly  AAM in NAD in w/c on 02  HEENT:  Oral pharyngeal pharynx clear with no lesions, no postnasal drip or exudate noted.   NECK:  Supple w/ fair ROM. No JVD. Normal carotid impulses w/o bruits; no thyromegaly or nodules palpated; no lymphadenopathy.  RESP:  distant bs, trace end exp wheeze   CARD:  RRR. No murmurs, rubs or gallops. 1-2+edema to BLE pulses intact. No cyanosis or clubbing.  GI:   Obese. soft & nontender. Bowel sounds present in all quadrants. No organomegaly or masses detected.  Musco: Warm bil, no deformities  or joint swelling noted.    Skin: Warm and dry. No lesions or rashes  .     Assessment & Plan:

## 2013-02-06 NOTE — Assessment & Plan Note (Signed)
Compensated on present regimen  Patient's medications were reviewed today and patient education was given. Computerized medication calendar was adjusted/completed  Plan  Make sure you wear you BIPAP with naps and At bedtime   Continue with O2 at 3lm continuous flow . , 5 l/m with walking  Follow up  With Dr. Sherene Sires  In 6 weeks  Continue on Budesonide Neb Twice daily   Continue on albuterol and ipratropium Neb .Four times a day   May finish omeprazole then switch back to [email or web address]  Please contact office for sooner follow up if symptoms do not improve or worsen or seek emergency care

## 2013-02-06 NOTE — Patient Instructions (Signed)
Make sure you wear you BIPAP with naps and At bedtime   Continue with O2 at 3lm continuous flow . , 5 l/m with walking  Follow up  With Dr. Sherene Sires  In 6 weeks  Continue on Budesonide Neb Twice daily   Continue on albuterol and ipratropium Neb .Four times a day   May finish omeprazole then switch back to [email or web address]  Please contact office for sooner follow up if symptoms do not improve or worsen or seek emergency care

## 2013-02-07 NOTE — Addendum Note (Signed)
Addended by: Boone Master E on: 02/07/2013 09:11 AM   Modules accepted: Orders

## 2013-02-19 NOTE — Addendum Note (Signed)
Addended by: Boone Master E on: 02/19/2013 10:44 AM   Modules accepted: Orders, Medications

## 2013-03-05 ENCOUNTER — Other Ambulatory Visit: Payer: Self-pay | Admitting: Nurse Practitioner

## 2013-03-07 ENCOUNTER — Telehealth: Payer: Self-pay | Admitting: Family Medicine

## 2013-03-07 DIAGNOSIS — J9610 Chronic respiratory failure, unspecified whether with hypoxia or hypercapnia: Secondary | ICD-10-CM

## 2013-03-07 DIAGNOSIS — J449 Chronic obstructive pulmonary disease, unspecified: Secondary | ICD-10-CM

## 2013-03-07 NOTE — Telephone Encounter (Signed)
Daughter Armanda Magicsha Luckette [phone] called and states that she has moved her [place] in with her to [place] and needs his meds ref[facility] to [facility] [address] [place].  He is completely out of [place].  She just needs refills until she can get him established some where there.

## 2013-03-08 MED ORDER — INSULIN NPH (HUMAN) (ISOPHANE) 100 UNIT/ML ~~LOC~~ SUSP: SUBCUTANEOUS | Status: AC

## 2013-03-08 MED ORDER — INSULIN ASPART 100 UNIT/ML ~~LOC~~ SOLN: 22.0000 [IU] | Freq: Three times a day (TID) | SUBCUTANEOUS | Status: AC

## 2013-03-08 MED ORDER — IPRATROPIUM BROMIDE 0.02 % IN SOLN: 0.5000 mg | Freq: Four times a day (QID) | RESPIRATORY_TRACT | Status: AC

## 2013-03-08 MED ORDER — ALBUTEROL SULFATE (2.5 MG/3ML) 0.083% IN NEBU: 5.0000 mg | INHALATION_SOLUTION | Freq: Four times a day (QID) | RESPIRATORY_TRACT | Status: AC

## 2013-03-08 MED ORDER — BISOPROLOL FUMARATE 5 MG PO TABS: 5.0000 mg | ORAL_TABLET | Freq: Two times a day (BID) | ORAL | Status: AC

## 2013-03-08 MED ORDER — PANTOPRAZOLE SODIUM 40 MG PO TBEC: DELAYED_RELEASE_TABLET | ORAL | Status: DC

## 2013-03-08 MED ORDER — FLUTICASONE PROPIONATE 50 MCG/ACT NA SUSP: 2.0000 | Freq: Every day | NASAL | Status: AC

## 2013-03-08 MED ORDER — FUROSEMIDE 80 MG PO TABS: 80.0000 mg | ORAL_TABLET | Freq: Two times a day (BID) | ORAL | Status: AC

## 2013-03-08 MED ORDER — BUDESONIDE 0.25 MG/2ML IN SUSP: 0.2500 mg | Freq: Two times a day (BID) | RESPIRATORY_TRACT | Status: AC

## 2013-03-08 MED ORDER — CALCITRIOL 0.25 MCG PO CAPS: 0.2500 ug | ORAL_CAPSULE | Freq: Every day | ORAL | Status: AC

## 2013-03-08 MED ORDER — PANTOPRAZOLE SODIUM 40 MG PO TBEC: DELAYED_RELEASE_TABLET | ORAL | Status: AC

## 2013-03-08 NOTE — Telephone Encounter (Signed)
Refill all his medications for one month at the pharmacy in [place]

## 2013-03-12 ENCOUNTER — Telehealth: Payer: Self-pay | Admitting: Internal Medicine

## 2013-03-12 NOTE — Telephone Encounter (Signed)
lmomtcb x1 

## 2013-03-13 NOTE — Telephone Encounter (Signed)
lmtcb x1 

## 2013-03-13 NOTE — Telephone Encounter (Signed)
Pt 's daughter returning call can be reached at [phone].[name] EvertsJuanita S [name]

## 2013-03-13 NOTE — Telephone Encounter (Signed)
lmomtcb x 2  

## 2013-03-14 NOTE — Telephone Encounter (Signed)
ATC number given, sounded like someone picked up, but then call was disconnected. ATC back, line busy. WCB. Carron CurieJennifer Castillo, CMA

## 2013-03-15 NOTE — Telephone Encounter (Signed)
LMTCBx2. Jennifer Castillo, CMA  

## 2013-03-16 NOTE — Telephone Encounter (Signed)
ATC again. Per protocol I will sign off on message and await a call back.Carron CurieJennifer Elijah Michaelis, CMA

## 2013-03-20 ENCOUNTER — Ambulatory Visit: Payer: Medicare Other | Admitting: Internal Medicine

## 2013-04-23 IMAGING — CR DG CHEST 1V PORT
1 series · 1 of 1 positions shown · non-contrast
Comparison: 04/09/2010

CLINICAL DATA: Shortness of breath, COPD.

PORTABLE CHEST - 1 VIEW

[AP]
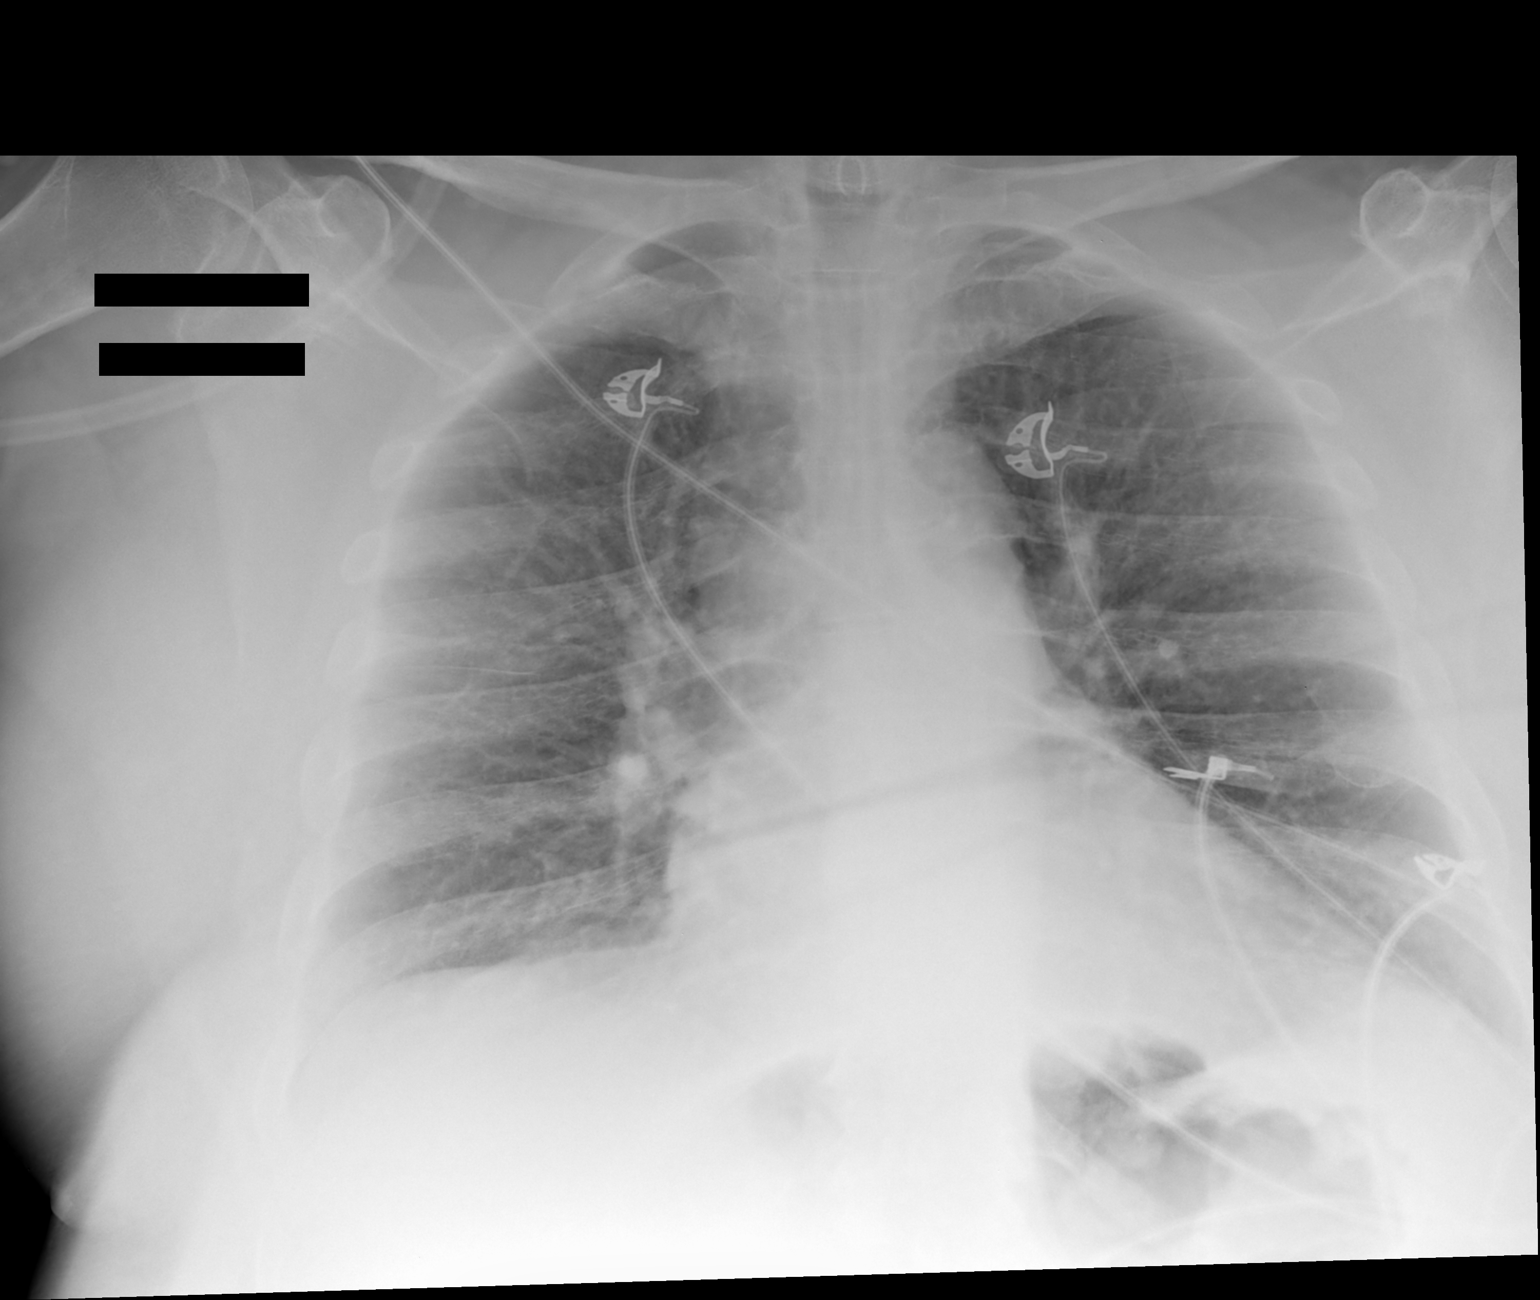

[1 of 1 positions shown; findings below may reference images not displayed]

FINDINGS: Heart is upper limits normal in size.  Lungs are clear.
No effusions, edema or acute bony abnormality.
IMPRESSION: No active cardiopulmonary disease.

## 2013-04-28 IMAGING — CR DG CHEST 1V PORT
1 series · 1 of 1 positions shown · non-contrast
Comparison: 04/17/2011; 04/16/2011; 04/09/2010/

CLINICAL DATA: COPD

PORTABLE CHEST - 1 VIEW

[AP]
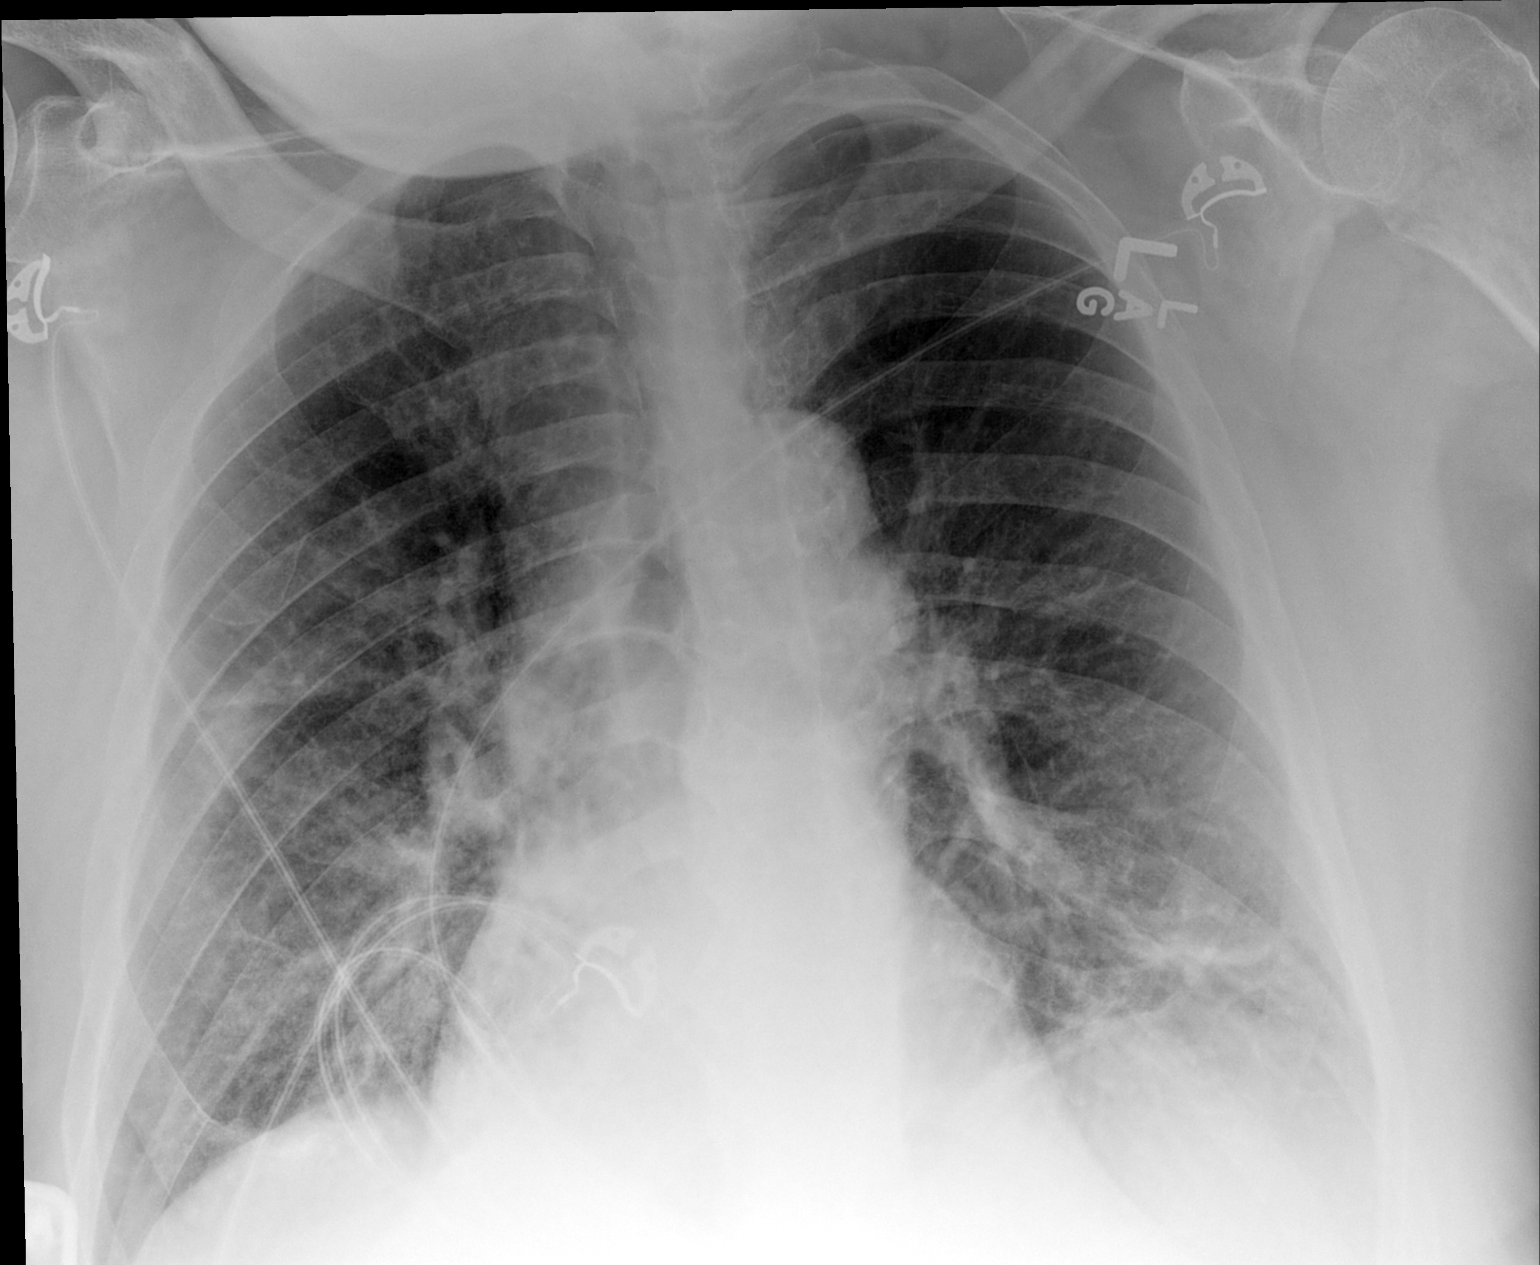

[1 of 1 positions shown; findings below may reference images not displayed]

FINDINGS: Examination is degraded secondary to patient positioning.
Grossly unchanged cardiac silhouette and mediastinal contours.
Interval increase in bilateral lower and right mid lung
heterogeneous air space opacities.  Pulmonary vasculature is
indistinct with cephalization of flow.  No definite pleural
effusion, though note, the right costophrenic angle is excluded
from view.  No definite pneumothorax.  Unchanged bones.
IMPRESSION: 1.  Minimally degraded examination with findings of worsening
bilateral heterogeneous air space opacities, possibly atelectasis
though underlying infection not excluded. Further evaluation may be
obtained with a PA and lateral chest radiograph as clinically
indicated.

2.  Query mild pulmonary edema.

## 2013-04-29 IMAGING — CT CT CHEST W/O CM
2 of 4 series · 15 of 36 positions shown, 18 images · non-contrast
Comparison: Portable chest obtained earlier today.

CLINICAL DATA: Hemoptysis.  Clinical concern for a lung mass.

CT CHEST WITHOUT CONTRAST
TECHNIQUE: Multidetector CT imaging of the chest was performed
following the standard protocol without IV contrast.

[Series 2: chest w/o st · axial · non-contrast · 0.74mm/px · z∈[-247,+28]mm · 12 of 65 slices shown, 15 images]
[im 5/65  mediastinal]
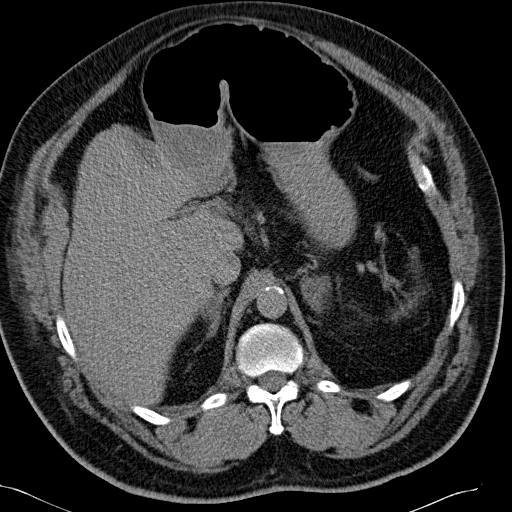
[im 5/65  lung]
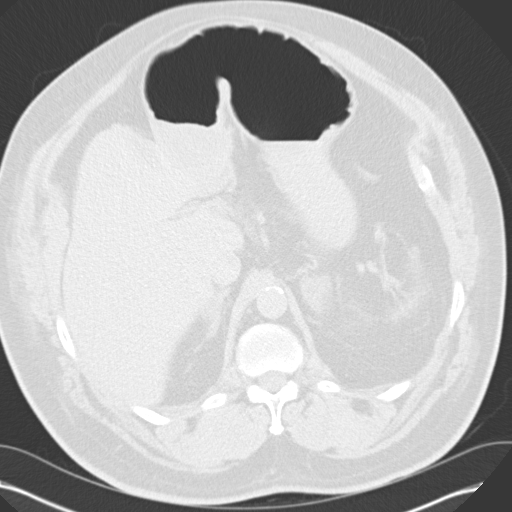
[im 10/65  lung]
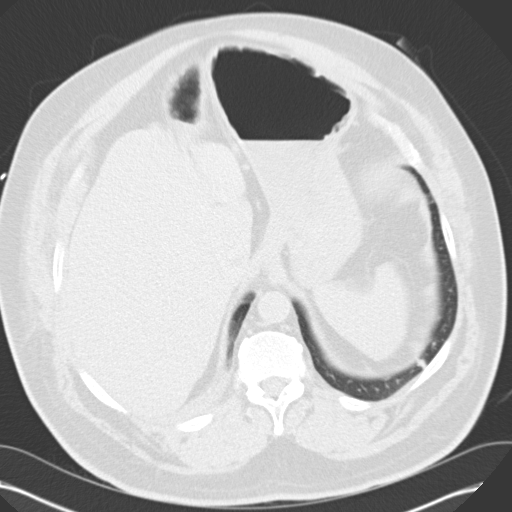
[im 15/65  lung]
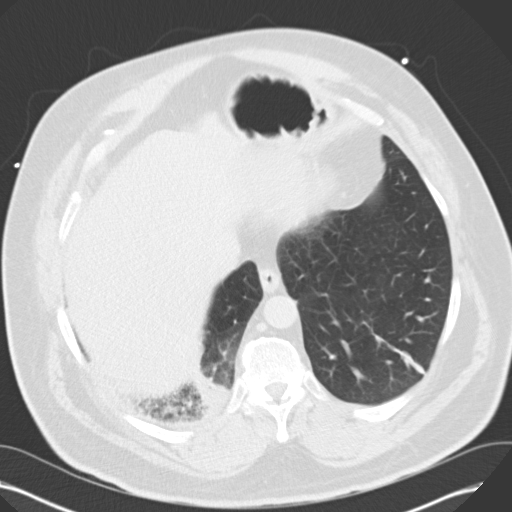
[im 20/65  lung]
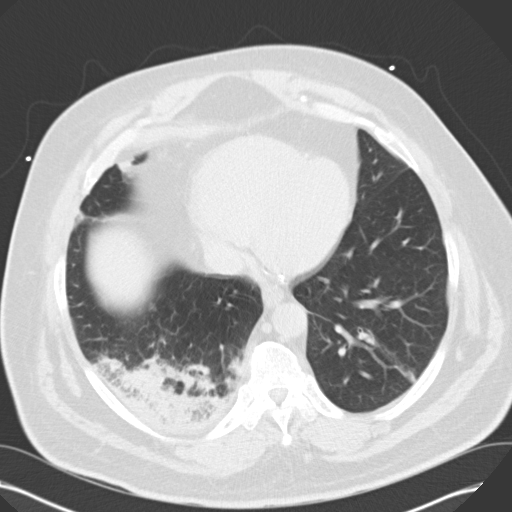
[im 25/65  mediastinal]
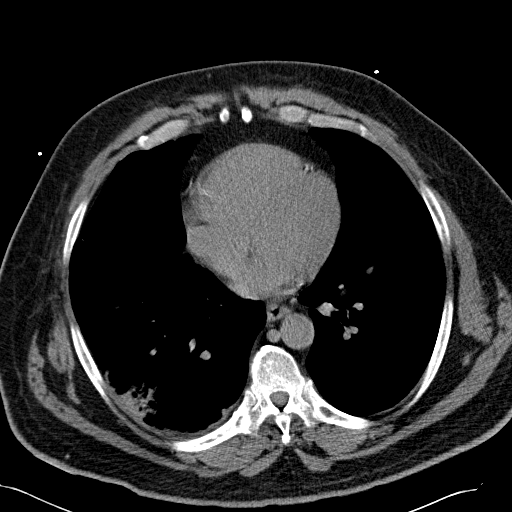
[im 25/65  lung]
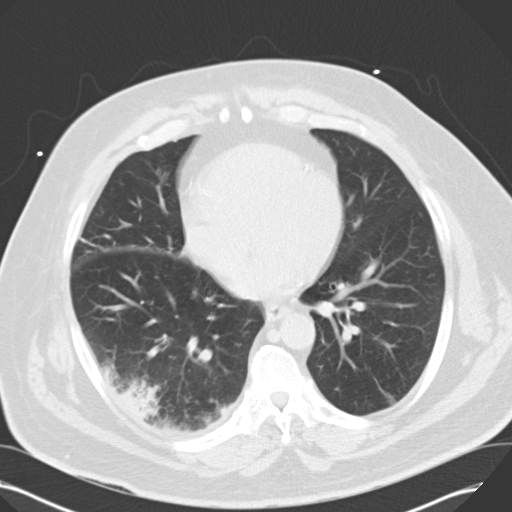
[im 30/65  lung]
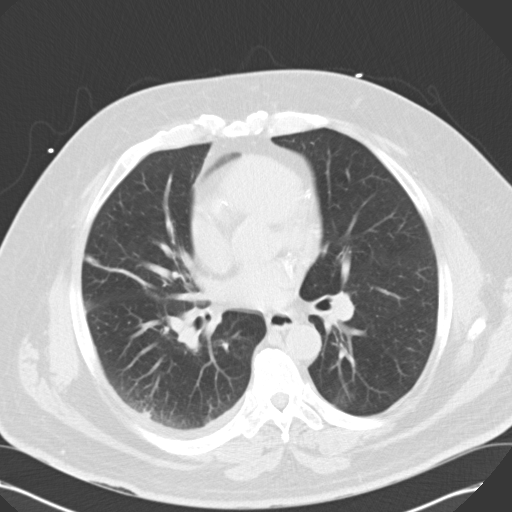
[im 35/65  lung]
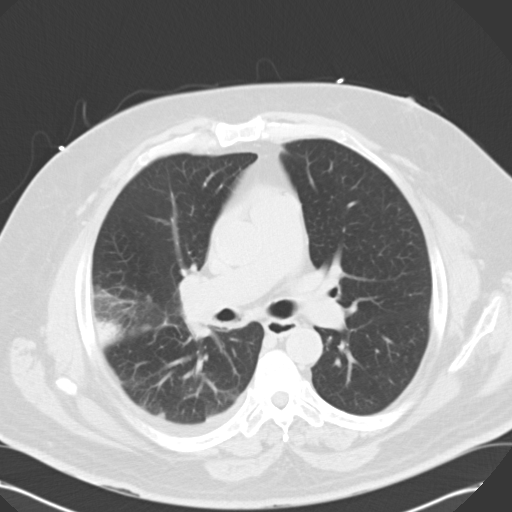
[im 40/65  lung]
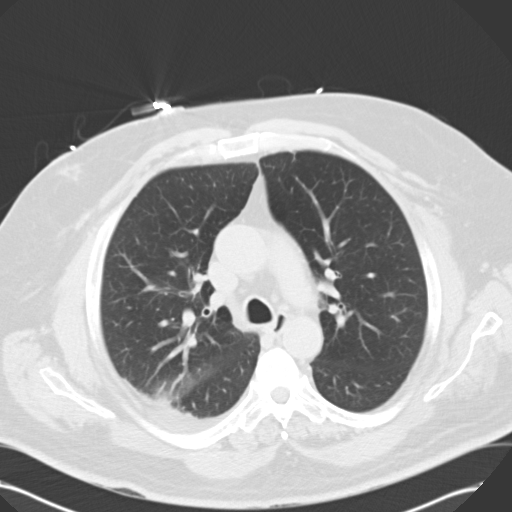
[im 45/65  mediastinal]
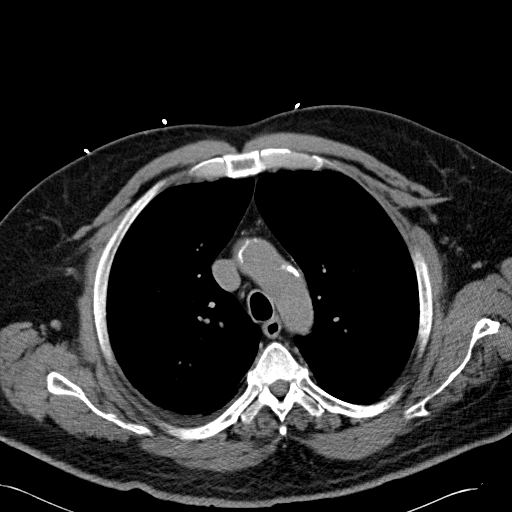
[im 45/65  lung]
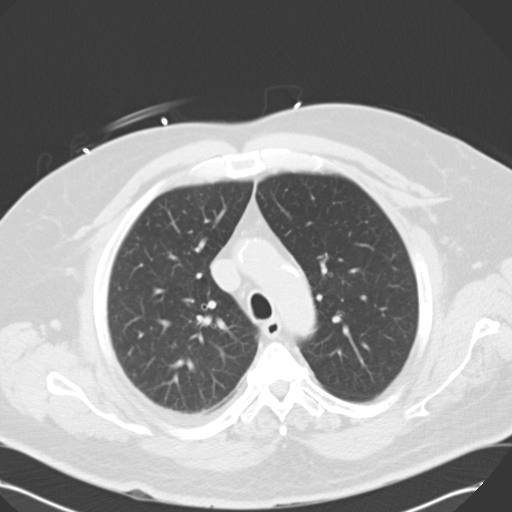
[im 50/65  lung]
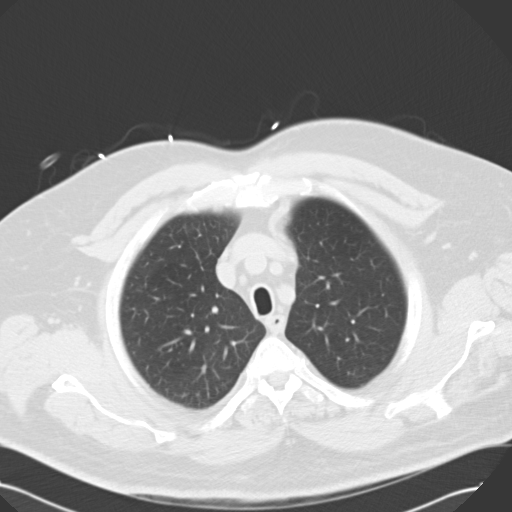
[im 55/65  lung]
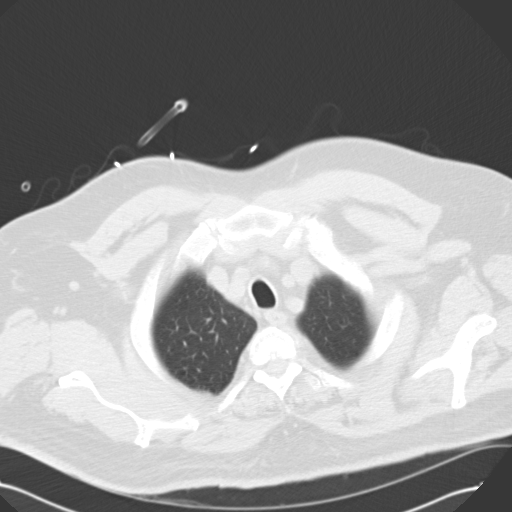
[im 60/65  lung]
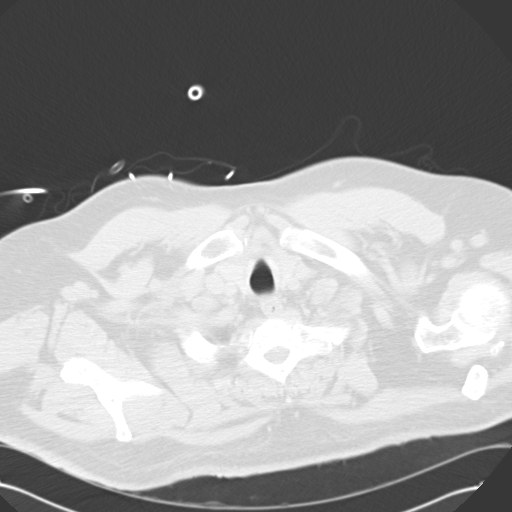

[Series 602: cor · coronal · 0.74mm/px · 3 of 141 slices shown]
[im 29/141  lung]
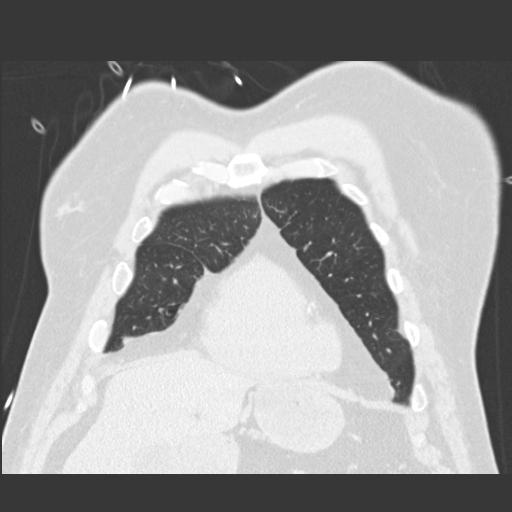
[im 57/141  lung]
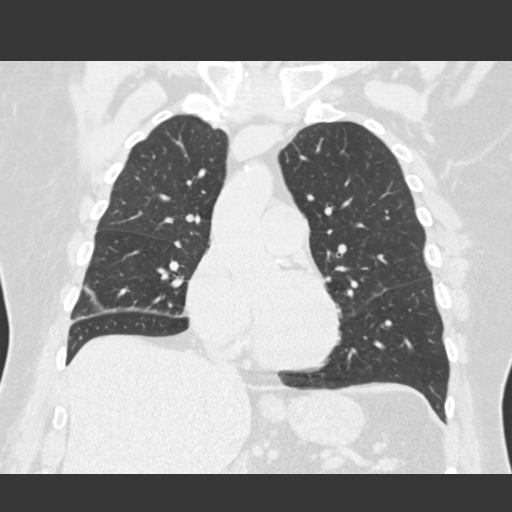
[im 85/141  lung]
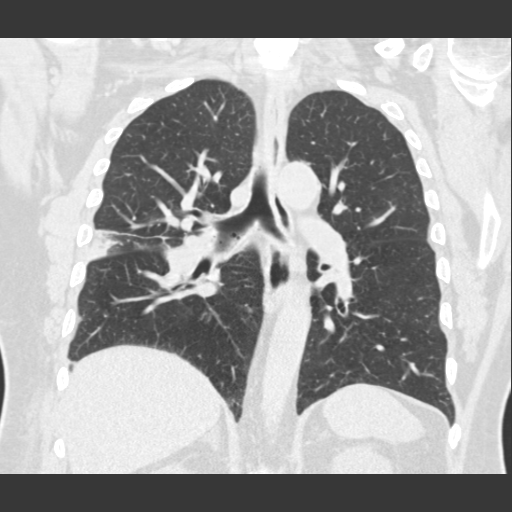

[15 of 36 positions shown; findings below may reference images not displayed]

FINDINGS: Dense airspace opacity in the posterior aspect of the
right lower lobe, with a patchy configuration.  Small amount of
dense of airspace opacity in the posterior lateral aspect of the
right middle lobe with some linear density. Linear density in the
left lower lobe.  Small right pleural effusion.  No enlarged lymph
nodes.  Dense coronary artery calcifications.  Thoracic spine
degenerative changes, including changes of DISH.
IMPRESSION: 1.  Right lower lobe and right middle lobe pneumonia.
2.  Small reactive right pleural effusion.
3.  Left lower lobe and right middle lobe linear atelectasis or
scarring.
4.  No mass visualized.
5.  Dense coronary artery atheromatous calcifications.

## 2013-04-29 IMAGING — CR DG CHEST 1V PORT
1 series · 1 of 1 positions shown · non-contrast
Comparison: 04/21/2011.

CLINICAL DATA: Congestive heart failure.

PORTABLE CHEST - 1 VIEW

[AP]
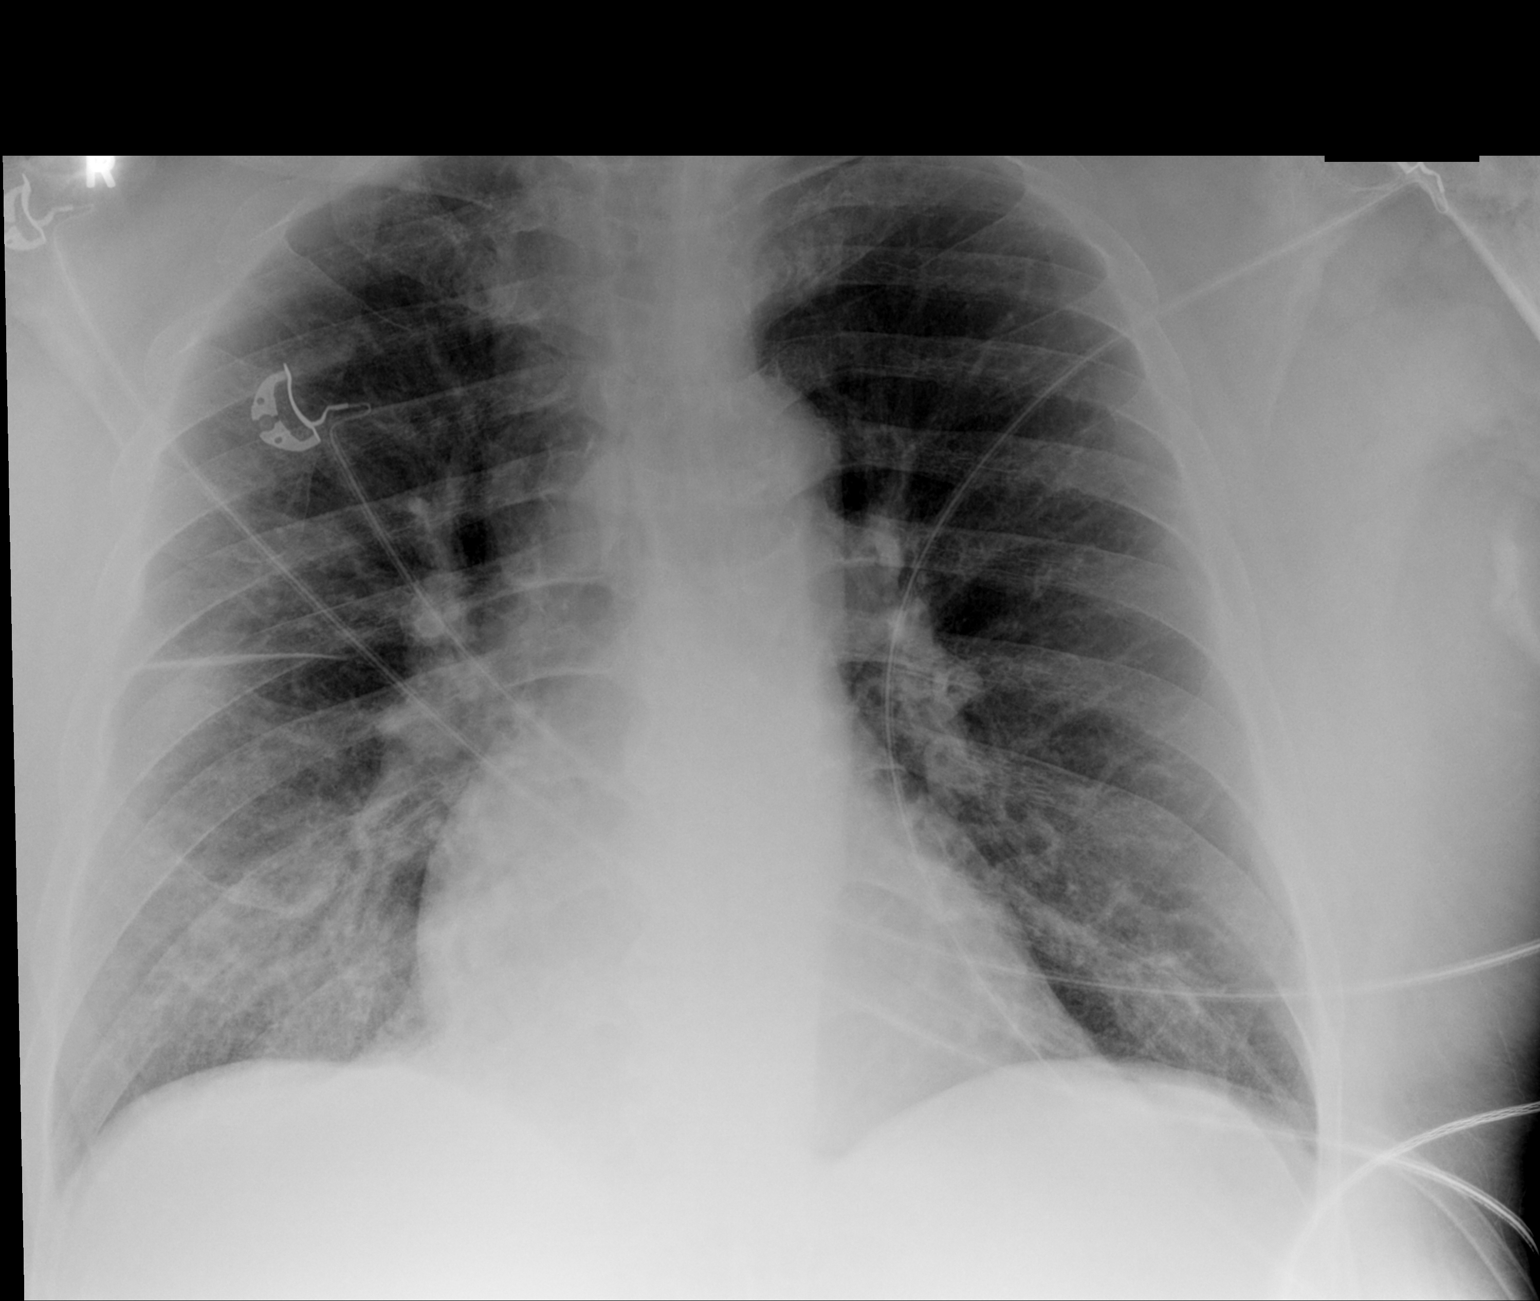

[1 of 1 positions shown; findings below may reference images not displayed]

FINDINGS: Thickening of the minor fissure.  Pulmonary vascular
congestion.  This may represent mild congestive heart failure type
changes.  Additionally, right base subtle consolidation.  This may
represent pulmonary vascular prominence and crowding of vessels
although subtle infiltrate not excluded.  No gross pneumothorax.
Heart size top normal.
IMPRESSION: Pulmonary vascular prominence with small right-sided pleural
effusion may represent changes of mild congestive heart failure.

Question subtle right base infiltrate

## 2013-12-14 ENCOUNTER — Other Ambulatory Visit: Payer: Self-pay

## 2014-04-01 DEATH — deceased

## 2014-08-26 ENCOUNTER — Other Ambulatory Visit: Payer: Self-pay
